# Patient Record
Sex: Female | Born: 1945
Health system: Southern US, Community
[De-identification: ages and names within clinical notes are randomized; demographics above are authoritative.]

## PROBLEM LIST (undated history)

## (undated) DIAGNOSIS — I10 Essential (primary) hypertension: Secondary | ICD-10-CM

## (undated) DIAGNOSIS — Z90710 Acquired absence of both cervix and uterus: Secondary | ICD-10-CM

## (undated) DIAGNOSIS — Z5989 Other problems related to housing and economic circumstances: Secondary | ICD-10-CM

## (undated) DIAGNOSIS — F0390 Unspecified dementia without behavioral disturbance: Secondary | ICD-10-CM

## (undated) DIAGNOSIS — S6290XA Unspecified fracture of unspecified wrist and hand, initial encounter for closed fracture: Secondary | ICD-10-CM

## (undated) DIAGNOSIS — Z5987 Material hardship due to limited financial resources, not elsewhere classified: Secondary | ICD-10-CM

## (undated) DIAGNOSIS — I639 Cerebral infarction, unspecified: Secondary | ICD-10-CM

## (undated) DIAGNOSIS — Z720 Tobacco use: Secondary | ICD-10-CM

## (undated) DIAGNOSIS — IMO0002 Reserved for concepts with insufficient information to code with codable children: Secondary | ICD-10-CM

## (undated) DIAGNOSIS — R51 Headache: Secondary | ICD-10-CM

## (undated) DIAGNOSIS — F419 Anxiety disorder, unspecified: Secondary | ICD-10-CM

## (undated) DIAGNOSIS — Z90722 Acquired absence of ovaries, bilateral: Secondary | ICD-10-CM

## (undated) DIAGNOSIS — Z598 Other problems related to housing and economic circumstances: Secondary | ICD-10-CM

## (undated) DIAGNOSIS — M1990 Unspecified osteoarthritis, unspecified site: Secondary | ICD-10-CM

## (undated) DIAGNOSIS — A5901 Trichomonal vulvovaginitis: Secondary | ICD-10-CM

## (undated) DIAGNOSIS — Z9079 Acquired absence of other genital organ(s): Secondary | ICD-10-CM

## (undated) DIAGNOSIS — F1011 Alcohol abuse, in remission: Secondary | ICD-10-CM

## (undated) DIAGNOSIS — F1410 Cocaine abuse, uncomplicated: Secondary | ICD-10-CM

## (undated) DIAGNOSIS — R519 Headache, unspecified: Secondary | ICD-10-CM

## (undated) HISTORY — DX: Cocaine abuse, uncomplicated: F14.10

## (undated) HISTORY — DX: Tobacco use: Z72.0

## (undated) HISTORY — PX: FOOT SURGERY: SHX648

## (undated) HISTORY — DX: Unspecified fracture of unspecified wrist and hand, initial encounter for closed fracture: S62.90XA

## (undated) HISTORY — DX: Unspecified osteoarthritis, unspecified site: M19.90

## (undated) HISTORY — DX: Headache: R51

## (undated) HISTORY — DX: Other problems related to housing and economic circumstances: Z59.89

## (undated) HISTORY — DX: Acquired absence of ovaries, bilateral: Z90.722

## (undated) HISTORY — DX: Trichomonal vulvovaginitis: A59.01

## (undated) HISTORY — DX: Anxiety disorder, unspecified: F41.9

## (undated) HISTORY — DX: Other problems related to housing and economic circumstances: Z59.8

## (undated) HISTORY — DX: Headache, unspecified: R51.9

## (undated) HISTORY — DX: Acquired absence of both cervix and uterus: Z90.79

## (undated) HISTORY — DX: Unspecified dementia, unspecified severity, without behavioral disturbance, psychotic disturbance, mood disturbance, and anxiety: F03.90

## (undated) HISTORY — DX: Reserved for concepts with insufficient information to code with codable children: IMO0002

## (undated) HISTORY — DX: Alcohol abuse, in remission: F10.11

## (undated) HISTORY — DX: Acquired absence of other genital organ(s): Z90.710

## (undated) HISTORY — DX: Cerebral infarction, unspecified: I63.9

## (undated) HISTORY — DX: Material hardship due to limited financial resources, not elsewhere classified: Z59.87

## (undated) HISTORY — DX: Essential (primary) hypertension: I10

---

## 1989-04-13 HISTORY — PX: ABDOMINAL HYSTERECTOMY: SHX81

## 1998-03-09 ENCOUNTER — Emergency Department (HOSPITAL_COMMUNITY): Admission: EM | Admit: 1998-03-09 | Discharge: 1998-03-09 | Payer: Self-pay | Admitting: *Deleted

## 1998-03-10 ENCOUNTER — Emergency Department (HOSPITAL_COMMUNITY): Admission: EM | Admit: 1998-03-10 | Discharge: 1998-03-10 | Payer: Self-pay | Admitting: Emergency Medicine

## 1998-03-10 ENCOUNTER — Encounter: Payer: Self-pay | Admitting: Emergency Medicine

## 1999-05-30 ENCOUNTER — Encounter: Payer: Self-pay | Admitting: Emergency Medicine

## 1999-05-30 ENCOUNTER — Emergency Department (HOSPITAL_COMMUNITY): Admission: EM | Admit: 1999-05-30 | Discharge: 1999-05-30 | Payer: Self-pay | Admitting: Emergency Medicine

## 1999-06-04 ENCOUNTER — Encounter: Admission: RE | Admit: 1999-06-04 | Discharge: 1999-06-04 | Payer: Self-pay | Admitting: Internal Medicine

## 1999-08-12 ENCOUNTER — Ambulatory Visit (HOSPITAL_COMMUNITY): Admission: RE | Admit: 1999-08-12 | Discharge: 1999-08-12 | Payer: Self-pay | Admitting: Internal Medicine

## 1999-08-12 ENCOUNTER — Encounter: Admission: RE | Admit: 1999-08-12 | Discharge: 1999-08-12 | Payer: Self-pay | Admitting: Internal Medicine

## 1999-09-09 ENCOUNTER — Encounter: Admission: RE | Admit: 1999-09-09 | Discharge: 1999-09-09 | Payer: Self-pay | Admitting: Internal Medicine

## 2003-02-15 ENCOUNTER — Emergency Department (HOSPITAL_COMMUNITY): Admission: EM | Admit: 2003-02-15 | Discharge: 2003-02-15 | Payer: Self-pay | Admitting: Emergency Medicine

## 2003-07-10 ENCOUNTER — Emergency Department (HOSPITAL_COMMUNITY): Admission: EM | Admit: 2003-07-10 | Discharge: 2003-07-10 | Payer: Self-pay | Admitting: *Deleted

## 2003-08-20 ENCOUNTER — Emergency Department (HOSPITAL_COMMUNITY): Admission: EM | Admit: 2003-08-20 | Discharge: 2003-08-20 | Payer: Self-pay | Admitting: Emergency Medicine

## 2004-05-18 ENCOUNTER — Emergency Department (HOSPITAL_COMMUNITY): Admission: EM | Admit: 2004-05-18 | Discharge: 2004-05-18 | Payer: Self-pay | Admitting: Emergency Medicine

## 2004-06-05 ENCOUNTER — Ambulatory Visit: Payer: Self-pay | Admitting: Internal Medicine

## 2004-07-30 ENCOUNTER — Emergency Department (HOSPITAL_COMMUNITY): Admission: EM | Admit: 2004-07-30 | Discharge: 2004-07-30 | Payer: Self-pay | Admitting: Emergency Medicine

## 2004-09-16 ENCOUNTER — Ambulatory Visit: Payer: Self-pay | Admitting: Internal Medicine

## 2005-08-12 ENCOUNTER — Emergency Department (HOSPITAL_COMMUNITY): Admission: EM | Admit: 2005-08-12 | Discharge: 2005-08-12 | Payer: Self-pay | Admitting: Family Medicine

## 2005-08-28 ENCOUNTER — Emergency Department (HOSPITAL_COMMUNITY): Admission: EM | Admit: 2005-08-28 | Discharge: 2005-08-28 | Payer: Self-pay | Admitting: Emergency Medicine

## 2005-09-04 ENCOUNTER — Ambulatory Visit: Payer: Self-pay | Admitting: Hospitalist

## 2005-10-22 ENCOUNTER — Encounter: Payer: Self-pay | Admitting: *Deleted

## 2006-02-21 ENCOUNTER — Observation Stay (HOSPITAL_COMMUNITY): Admission: EM | Admit: 2006-02-21 | Discharge: 2006-02-22 | Payer: Self-pay | Admitting: Emergency Medicine

## 2006-02-21 ENCOUNTER — Ambulatory Visit: Payer: Self-pay | Admitting: Infectious Diseases

## 2006-06-08 DIAGNOSIS — J45909 Unspecified asthma, uncomplicated: Secondary | ICD-10-CM | POA: Insufficient documentation

## 2006-06-08 DIAGNOSIS — A5901 Trichomonal vulvovaginitis: Secondary | ICD-10-CM

## 2006-06-08 DIAGNOSIS — IMO0002 Reserved for concepts with insufficient information to code with codable children: Secondary | ICD-10-CM

## 2006-06-08 DIAGNOSIS — I10 Essential (primary) hypertension: Secondary | ICD-10-CM

## 2006-06-08 DIAGNOSIS — F1410 Cocaine abuse, uncomplicated: Secondary | ICD-10-CM | POA: Insufficient documentation

## 2006-06-08 DIAGNOSIS — R319 Hematuria, unspecified: Secondary | ICD-10-CM | POA: Insufficient documentation

## 2006-06-08 DIAGNOSIS — Z9079 Acquired absence of other genital organ(s): Secondary | ICD-10-CM | POA: Insufficient documentation

## 2006-07-03 DIAGNOSIS — I6350 Cerebral infarction due to unspecified occlusion or stenosis of unspecified cerebral artery: Secondary | ICD-10-CM | POA: Insufficient documentation

## 2006-07-03 DIAGNOSIS — R519 Headache, unspecified: Secondary | ICD-10-CM | POA: Insufficient documentation

## 2006-07-03 DIAGNOSIS — M1990 Unspecified osteoarthritis, unspecified site: Secondary | ICD-10-CM

## 2006-07-03 DIAGNOSIS — R51 Headache: Secondary | ICD-10-CM

## 2006-07-03 DIAGNOSIS — F411 Generalized anxiety disorder: Secondary | ICD-10-CM | POA: Insufficient documentation

## 2006-07-03 DIAGNOSIS — S62309A Unspecified fracture of unspecified metacarpal bone, initial encounter for closed fracture: Secondary | ICD-10-CM | POA: Insufficient documentation

## 2006-07-03 DIAGNOSIS — F1011 Alcohol abuse, in remission: Secondary | ICD-10-CM | POA: Insufficient documentation

## 2006-07-03 DIAGNOSIS — F17200 Nicotine dependence, unspecified, uncomplicated: Secondary | ICD-10-CM

## 2006-08-26 ENCOUNTER — Telehealth (INDEPENDENT_AMBULATORY_CARE_PROVIDER_SITE_OTHER): Payer: Self-pay | Admitting: *Deleted

## 2006-11-26 ENCOUNTER — Emergency Department (HOSPITAL_COMMUNITY): Admission: EM | Admit: 2006-11-26 | Discharge: 2006-11-26 | Payer: Self-pay | Admitting: Emergency Medicine

## 2006-11-30 DIAGNOSIS — S2239XA Fracture of one rib, unspecified side, initial encounter for closed fracture: Secondary | ICD-10-CM | POA: Insufficient documentation

## 2006-12-04 ENCOUNTER — Ambulatory Visit: Payer: Self-pay | Admitting: Internal Medicine

## 2007-02-23 ENCOUNTER — Encounter (INDEPENDENT_AMBULATORY_CARE_PROVIDER_SITE_OTHER): Payer: Self-pay | Admitting: Hospitalist

## 2007-03-24 ENCOUNTER — Encounter (INDEPENDENT_AMBULATORY_CARE_PROVIDER_SITE_OTHER): Payer: Self-pay | Admitting: Hospitalist

## 2007-05-28 ENCOUNTER — Emergency Department (HOSPITAL_COMMUNITY): Admission: EM | Admit: 2007-05-28 | Discharge: 2007-05-28 | Payer: Self-pay | Admitting: Emergency Medicine

## 2007-05-28 ENCOUNTER — Telehealth (INDEPENDENT_AMBULATORY_CARE_PROVIDER_SITE_OTHER): Payer: Self-pay | Admitting: *Deleted

## 2007-06-03 ENCOUNTER — Ambulatory Visit: Payer: Self-pay | Admitting: Infectious Diseases

## 2007-06-03 ENCOUNTER — Encounter (INDEPENDENT_AMBULATORY_CARE_PROVIDER_SITE_OTHER): Payer: Self-pay | Admitting: *Deleted

## 2007-06-03 LAB — CONVERTED CEMR LAB
Calcium: 9.9 mg/dL (ref 8.4–10.5)
Creatinine, Ser: 1.03 mg/dL (ref 0.40–1.20)
Glucose, Bld: 72 mg/dL (ref 70–99)
Potassium: 4.1 meq/L (ref 3.5–5.3)
Sodium: 144 meq/L (ref 135–145)

## 2007-11-04 ENCOUNTER — Ambulatory Visit: Payer: Self-pay | Admitting: Internal Medicine

## 2007-11-04 DIAGNOSIS — R079 Chest pain, unspecified: Secondary | ICD-10-CM

## 2007-11-18 ENCOUNTER — Ambulatory Visit: Payer: Self-pay | Admitting: *Deleted

## 2007-11-18 DIAGNOSIS — M21619 Bunion of unspecified foot: Secondary | ICD-10-CM

## 2007-11-18 DIAGNOSIS — J069 Acute upper respiratory infection, unspecified: Secondary | ICD-10-CM | POA: Insufficient documentation

## 2008-01-28 ENCOUNTER — Encounter: Payer: Self-pay | Admitting: Internal Medicine

## 2008-09-07 ENCOUNTER — Telehealth: Payer: Self-pay | Admitting: Internal Medicine

## 2009-03-21 ENCOUNTER — Ambulatory Visit: Payer: Self-pay | Admitting: Infectious Diseases

## 2009-03-21 DIAGNOSIS — L0293 Carbuncle, unspecified: Secondary | ICD-10-CM

## 2009-03-21 DIAGNOSIS — L0292 Furuncle, unspecified: Secondary | ICD-10-CM | POA: Insufficient documentation

## 2009-03-21 LAB — CONVERTED CEMR LAB
ALT: 12 units/L (ref 0–35)
Cholesterol: 149 mg/dL (ref 0–200)
Glucose, Bld: 96 mg/dL (ref 70–99)
HDL: 64 mg/dL (ref >39)
Potassium: 4 meq/L (ref 3.5–5.3)
Sodium: 143 meq/L (ref 135–145)
TSH: 1.088 microintl units/mL (ref 0.350–4.5)
Total Bilirubin: 0.3 mg/dL (ref 0.3–1.2)
Total CHOL/HDL Ratio: 2.3
Triglycerides: 143 mg/dL (ref <150)

## 2009-04-09 ENCOUNTER — Ambulatory Visit: Payer: Self-pay | Admitting: Internal Medicine

## 2009-04-09 ENCOUNTER — Ambulatory Visit (HOSPITAL_COMMUNITY): Admission: RE | Admit: 2009-04-09 | Discharge: 2009-04-09 | Payer: Self-pay | Admitting: Internal Medicine

## 2009-08-10 ENCOUNTER — Ambulatory Visit: Payer: Self-pay | Admitting: Internal Medicine

## 2009-08-10 DIAGNOSIS — F329 Major depressive disorder, single episode, unspecified: Secondary | ICD-10-CM

## 2009-08-10 LAB — CONVERTED CEMR LAB
ALT: 15 units/L (ref 0–35)
AST: 20 units/L (ref 0–37)
Alkaline Phosphatase: 62 units/L (ref 39–117)
Calcium: 9.3 mg/dL (ref 8.4–10.5)
Creatinine, Ser: 0.88 mg/dL (ref 0.40–1.20)
Glucose, Bld: 87 mg/dL (ref 70–99)
Sodium: 143 meq/L (ref 135–145)
TSH: 1.042 microintl units/mL (ref 0.350–4.5)

## 2009-08-27 ENCOUNTER — Encounter: Payer: Self-pay | Admitting: Internal Medicine

## 2010-05-08 ENCOUNTER — Telehealth: Payer: Self-pay | Admitting: Internal Medicine

## 2010-05-11 IMAGING — CR DG FOOT COMPLETE 3+V*L*
3 series · 3 of 3 positions shown · non-contrast
Comparison: No prior studies.

CLINICAL DATA: Chronic pain associated with the first through third
toes bilaterally.  History of previous surgery left fifth toe.

LEFT FOOT - COMPLETE 3+ VIEW

[t foot ap left]
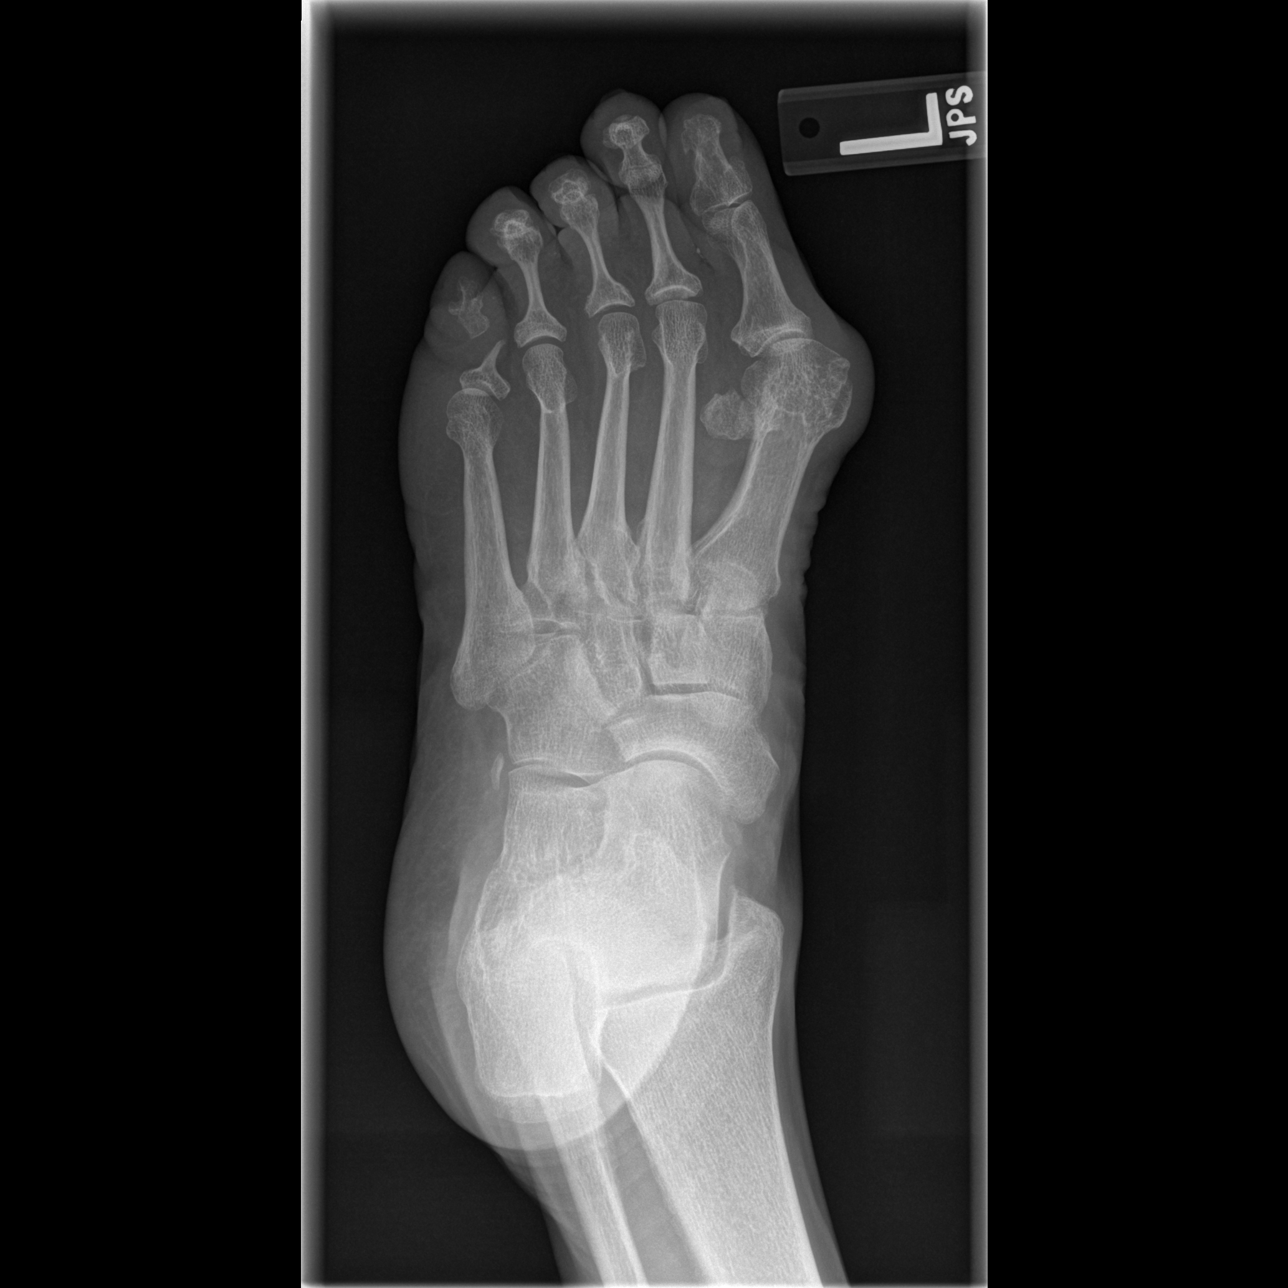

[t foot oblique left]
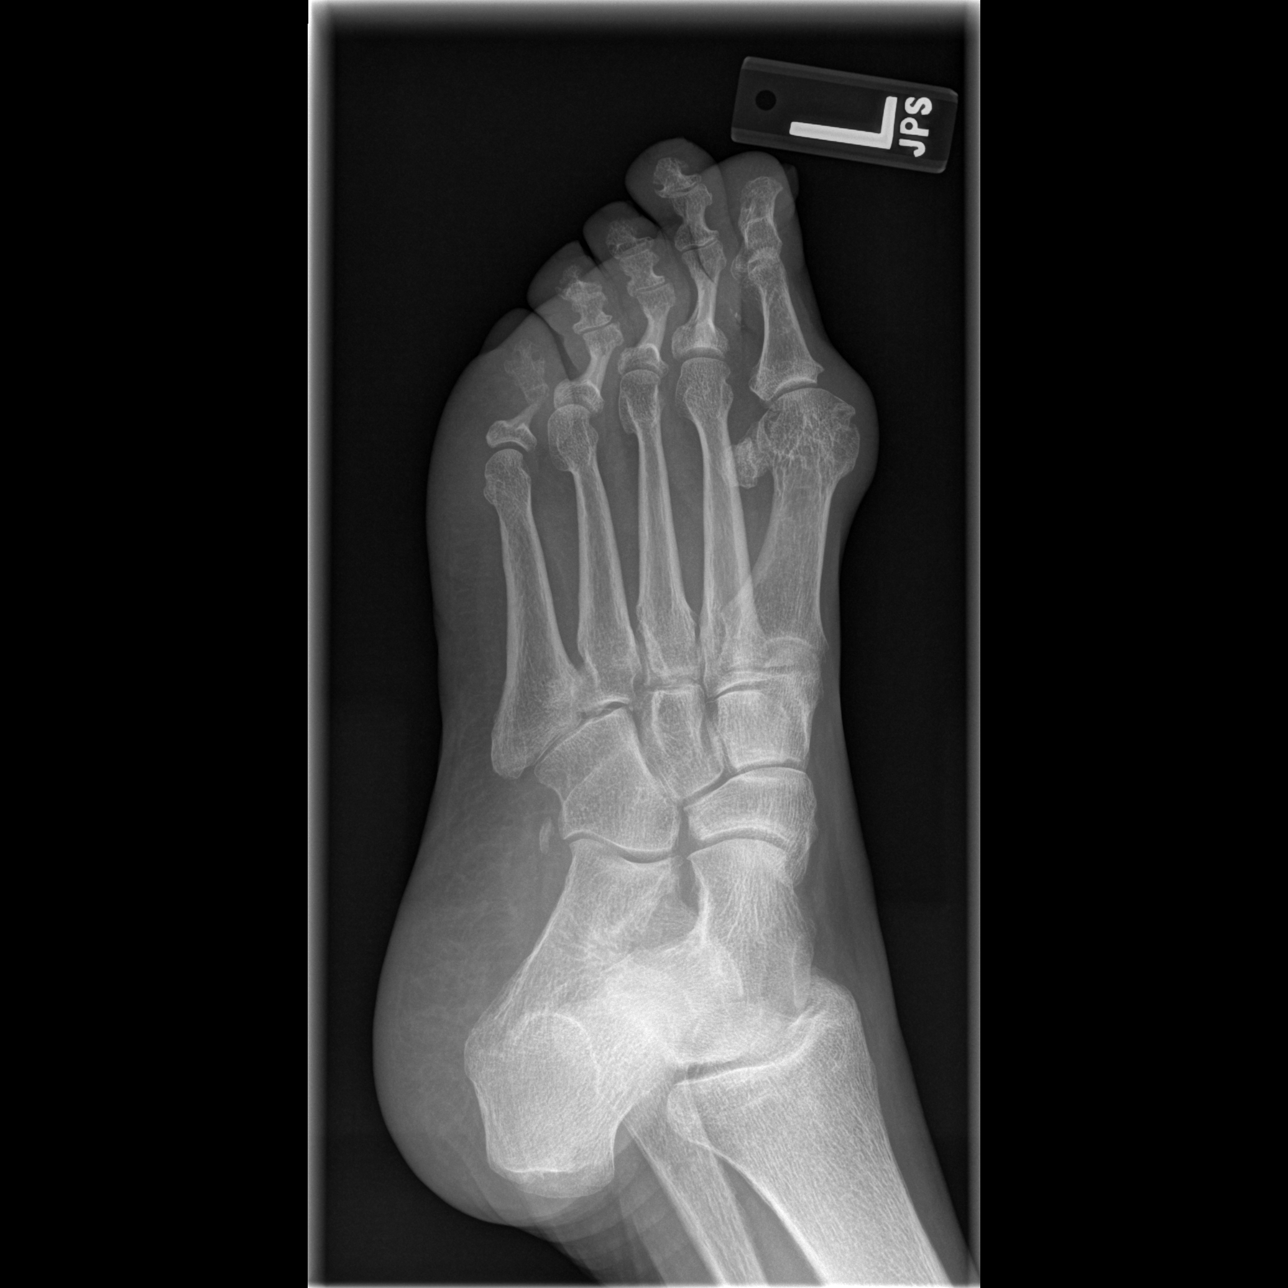

[t foot lat left]
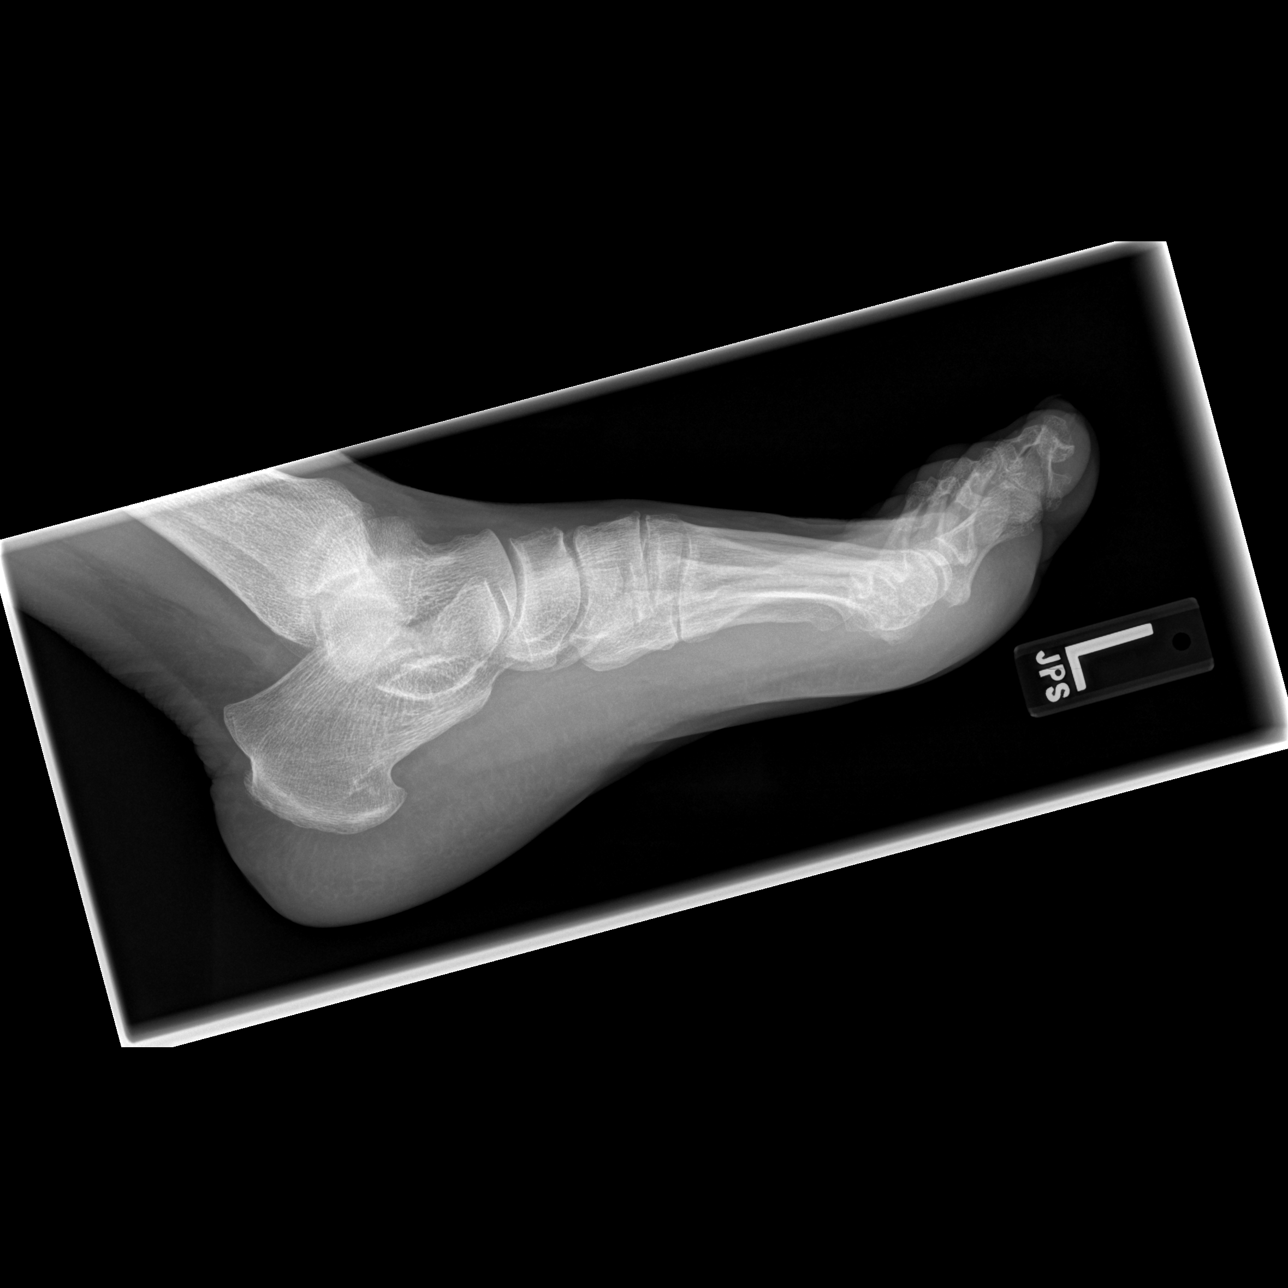

[3 of 3 positions shown; findings below may reference images not displayed]

FINDINGS: There are changes of a bunion associated the left first
metatarsal phalangeal joint with overgrowth of the first metatarsal
head and hallux valgus deformity present.  The distal portion of
the proximal phalanx of the left fifth toe has been previously
resected.  There is no evidence for occult fracture.  There are no
dislocations.
IMPRESSION: 1.  Changes of a bunion associated with the left first metatarsal
phalangeal joint.
2.  Previous surgery associated with the left 5th toe.
3.  No acute findings.

## 2010-07-02 ENCOUNTER — Ambulatory Visit: Admit: 2010-07-02 | Payer: Self-pay

## 2010-07-23 NOTE — Letter (Signed)
Summary: [place] [facility] Medical Assistance: [facility]  [place] [facility] Medical Assistance: P[employer]   Imported By: Florinda Marker 08/28/2009 14:59:04  _____________________________________________________________________  External Attachment:    Type:   Image     Comment:   External Document

## 2010-07-23 NOTE — Progress Notes (Signed)
Summary: med refill/gp  Phone Note Refill Request Message from:  Fax from Pharmacy on May 08, 2010 10:17 AM  Refills Requested: Medication #1:  ZESTORETIC 20-25 MG  TABS Take 1 tablet by mouth once a day   Last Refilled: 12/01/2009 Last appt. Feb 2011.   Method Requested: Electronic Initial call taken by: Chinita Pester RN,  May 08, 2010 10:17 AM  Follow-up for Phone Call        Refill approved for 2 months only; patient needs followup during that time and also bloodwork; before further refills are provided.  Thanks!!!    Prescriptions: ZESTORETIC 20-25 MG  TABS (LISINOPRIL-HYDROCHLOROTHIAZIDE) Take 1 tablet by mouth once a day  #30 x 2   Entered and Authorized by:   Vassie Loll MD   Signed by:   Vassie Loll MD on 05/09/2010   Method used:   [facility] [address] [phone]       [address]       [place], [place]  [address]       Ph: [phone] or [phone]       Fax: [phone]   RxID:   [id number]

## 2010-07-23 NOTE — Assessment & Plan Note (Signed)
Summary: FU VISIT RS FROM 08/07/09/DS   Vital Signs:  Patient profile:   65 year old female Height:      65.4 inches (166.12 cm) Weight:      139.7 pounds (63.50 kg) BMI:     23.05 Temp:     97.7 degrees F (36.50 degrees C) oral Pulse rate:   72 / minute BP sitting:   172 / 99  (right arm)  Vitals Entered By: Dorie Rank RN (August 10, 2009 9:55 AM) Is Patient Diabetic? No Pain Assessment Patient in pain? no      Nutritional Status BMI of 19 -24 = normal Nutritional Status Detail appetite good  Have you ever been in a relationship where you felt threatened, hurt or afraid?denies   Does patient need assistance? Functional Status Self care Ambulation Normal Comments FU and ck feet - toes numb - long time. Wants CNA.   History of Present Illness: 65 y/o female with pmh as outlined in the EMR; who comes to the clinic for followup of her chronic problems and also to get refill on her medications.  Patient is complaining of chronic numbness and pain on her toes (especially left foot toes); looking at her records she was refer to a podiatrist and she never follow with the appoinment; x-ray of her feet were done during previous visit demonstrating changes in size of her bunions, but w/o any other deformities or fractures.   Patient missed her appoinment at mental health as well and is now in need of her psychiatrist medication too.  Depression History:      The patient denies a depressed mood most of the day and a diminished interest in her usual daily activities.        The patient denies that she feels like life is not worth living, denies that she wishes that she were dead, and denies that she has thought about ending her life.         Preventive Screening-Counseling & Management  Alcohol-Tobacco     Smoking Status: current     Smoking Cessation Counseling: yes     Packs/Day: 2cigs per day     Year Started: 1pk every 2 weeks  Caffeine-Diet-Exercise     Does Patient  Exercise: yes     Type of exercise: walking  Problems Prior to Update: 1)  Depressive Disorder Not Elsewhere Classified  (ICD-311) 2)  Foot Deformity, B/l  (ICD-736.70) 3)  Furuncle  (ICD-680.9) 4)  Bunion  (ICD-727.1) 5)  Uri  (ICD-465.9) 6)  Chest Pain Unspecified  (ICD-786.50) 7)  Fracture, Rib  (ICD-807.00) 8)  Hypertension  (ICD-401.9) 9)  Hx of Hematuria, Chronic  (ICD-599.7) 10)  Hx of Trichomonal Vaginitis  (ICD-131.01) 11)  Asthma  (ICD-493.90) 12)  Anxiety State Nos  (ICD-300.00) 13)  Headache, Chronic  (ICD-784.0) 14)  Cva  (ICD-434.91) 15)  Tah/bso, Hx of  (ICD-V45.77) 16)  Tobacco Abuse  (ICD-305.1) 17)  Abuse, Cocaine, Unspecified  (ICD-305.60) 18)  Abuse, Alcohol, in Remission  (ICD-305.03) 19)  Degenerative Joint Disease, Back  (ICD-715.98) 20)  Fracture, Hand  (ICD-815.00) 21)  Inadequate Material Resources  (ICD-V60.2) 22)  Problem, Mental/behavioral Nos  (ICD-V40.9)  Current Problems (verified): 1)  Foot Deformity, B/l  (ICD-736.70) 2)  Furuncle  (ICD-680.9) 3)  Bunion  (ICD-727.1) 4)  Uri  (ICD-465.9) 5)  Chest Pain Unspecified  (ICD-786.50) 6)  Fracture, Rib  (ICD-807.00) 7)  Hypertension  (ICD-401.9) 8)  Hx of Hematuria, Chronic  (ICD-599.7) 9)  Hx  of Trichomonal Vaginitis  (ICD-131.01) 10)  Asthma  (ICD-493.90) 11)  Anxiety State Nos  (ICD-300.00) 12)  Headache, Chronic  (ICD-784.0) 13)  Cva  (ICD-434.91) 14)  Tah/bso, Hx of  (ICD-V45.77) 15)  Tobacco Abuse  (ICD-305.1) 16)  Abuse, Cocaine, Unspecified  (ICD-305.60) 17)  Abuse, Alcohol, in Remission  (ICD-305.03) 18)  Degenerative Joint Disease, Back  (ICD-715.98) 19)  Fracture, Hand  (ICD-815.00) 20)  Inadequate Material Resources  (ICD-V60.2) 21)  Problem, Mental/behavioral Nos  (ICD-V40.9)  Medications Prior to Update: 1)  Zestoretic 20-25 Mg  Tabs (Lisinopril-Hydrochlorothiazide) .... Take 1 Tablet By Mouth Once A Day 2)  Aspirin 81 Mg Tabs (Aspirin) .... Once Daily 3)  Doxycycline  Hyclate 100 Mg Caps (Doxycycline Hyclate) .... Take 1 Capsule By Mouth Daily.  Current Medications (verified): 1)  Zestoretic 20-25 Mg  Tabs (Lisinopril-Hydrochlorothiazide) .... Take 1 Tablet By Mouth Once A Day 2)  Aspirin 81 Mg Tabs (Aspirin) .... Once Daily  Allergies (verified): No Known Drug Allergies  Past History:  Past Medical History: Last updated: 06/08/2006 Current Problems:  HYPERTENSION (ICD-401.9) Hx of HEMATURIA, CHRONIC (ICD-599.7) Hx of TRICHOMONAL VAGINITIS (ICD-131.01) ASTHMA (ICD-493.90) ANXIETY STATE NOS (ICD-300.00) HEADACHE, CHRONIC (ICD-784.0) CVA (ICD-434.91) TAH/BSO, HX OF (ICD-V45.77) TOBACCO ABUSE (ICD-305.1) ABUSE, COCAINE, UNSPECIFIED (ICD-305.60) ABUSE, ALCOHOL, IN REMISSION (ICD-305.03) DEGENERATIVE JOINT DISEASE, BACK (ICD-715.98) FRACTURE, HAND (ICD-815.00) INADEQUATE MATERIAL RESOURCES (ICD-V60.2) PROBLEM, MENTAL/BEHAVIORAL NOS (ICD-V40.9)  Risk Factors: Exercise: yes (08/10/2009)  Risk Factors: Smoking Status: current (08/10/2009) Packs/Day: 2cigs per day (08/10/2009)  Social History: Packs/Day:  2cigs per day  Review of Systems       As per HPI.  Physical Exam  General:  alert, well-developed, and well-hydrated.   Lungs:  normal breath sounds, no crackles, and no wheezes.   Heart:  normal rate, regular rhythm, no murmur, and no gallop.   Abdomen:  soft and non-tender.   Msk:  Deformity noted on both foot at medial aspect. Pedal pulses strong. No redness, swelling or warmth appreciated. Patient complaining of numbness.  Extremities:  trace left pedal edema and trace right pedal edema.   Neurologic:  alert & oriented X3.     Impression & Recommendations:  Problem # 1:  DEPRESSIVE DISORDER NOT ELSEWHERE CLASSIFIED (ICD-311) [facility] and also for psychotherapy. Will refill her fluoxetine 20mg  daily today and will encourage her to follow with her psychiatrist. No SI, no  hallucination.  Her updated medication list for this problem includes:    Fluoxetine Hcl 20 Mg Caps (Fluoxetine hcl) .Marland Kitchen... Take 1 capsule by mouth once a day  Problem # 2:  FOOT DEFORMITY, B/L (ICD-736.70) Patient with bilateral bunions which are growing and most likely compressing her peroneal nerve (which would explain her feet numbness); patient was referred in the past to a podiatrist, but she missed the appointment. Will make another referral, will start neurontin and will also request for home health assistance, since she is unable to performed her ADL due to feet problems and also residual changes after CVA.   Orders: Home Health Referral [facility])  Problem # 3:  HYPERTENSION (ICD-401.9) Patient BP elevated, but she has been w/o medications for the last copule of weeks. Patient is asymptomatic; importance for medication compliance, risk and major side effects were discussed during this visit with her. Will advised to follow a low sodium diet and also to take her medications on daily basis (medication refill was given). Will check electrolytes and renal function today.  Her  updated medication list for this problem includes:    Zestoretic 20-25 Mg Tabs (Lisinopril-hydrochlorothiazide) .Marland Kitchen... Take 1 tablet by mouth once a day  Orders: T-Comprehensive Metabolic Panel ([id number]) T-TSH ([id number])  Problem # 4:  ANXIETY STATE NOS (ICD-300.00) Patient with hx of anxiety. She is requesting xanax, but there is no records of that medicine in her medication list. Will advised to follow with psychiatrist for further evaluation and medication adjustment. Will refill her fluoxetine today; she is not anxious and there is not SI or hallucination.  Her updated medication list for this problem includes:    Fluoxetine Hcl 20 Mg Caps (Fluoxetine hcl) .Marland Kitchen... Take 1 capsule by mouth once a day  Complete Medication List: 1)  Zestoretic 20-25 Mg Tabs (Lisinopril-hydrochlorothiazide) .... Take 1  tablet by mouth once a day 2)  Aspirin 81 Mg Tabs (Aspirin) .... Once daily 3)  Fluoxetine Hcl 20 Mg Caps (Fluoxetine hcl) .... Take 1 capsule by mouth once a day 4)  Neurontin 100 Mg Caps (Gabapentin) .... Take 2 tablets by mouth three times a day.  Patient Instructions: 1)  Please schedule a follow-up appointment in 3 weeks. 2)  Take your medications as prescribed. 3)  Follow with your mental health and podiatry appointment. 4)  Follow a low sodium diet (less than 2000 mg daily). 5)  You will be called with any abnormalities in the tests scheduled or performed today.  If you don't hear from [place] within a week from when the test was performed, you can assume that your test was normal.  Prescriptions: NEURONTIN 100 MG CAPS (GABAPENTIN) Take 2 tablets by mouth three times a day.  #90 x 2   Entered and Authorized by:   Vassie Loll MD   Signed by:   Vassie Loll MD on 08/10/2009   Method used:   [facility]        [facility]  [address] [phone][facility] (retail)       [address]       [place], [place]  [address]       Ph: [phone] or [phone]       Fax: [phone]   RxID:   [id number] FLUOXETINE HCL 20 MG CAPS (FLUOXETINE HCL) Take 1 capsule by mouth once a day  #31 x 1   Entered and Authorized by:   Vassie Loll MD   Signed by:   Vassie Loll MD on 08/10/2009   Method used:   [facility] to        [facility]  [address] [phone]* (retail)       [address]       [place], [place]  [address]       Ph: [phone] or [phone]       Fax: [phone]   RxID:   [id number]  Process Orders Check Orders Results:     [facility]: Check successful Tests Sent for requisitioning (August 21, 2009 8:21 PM):     08/10/2009: [facility] -- T-Comprehensive Metabolic Panel [[id number]] (signed)     08/10/2009: [facility] -- T-TSH [phone] (signed)    Prevention & Chronic Care Immunizations   Influenza  vaccine: Not documented   Influenza vaccine deferral: Refused  (03/21/2009)    Tetanus booster: Not documented    Pneumococcal vaccine: Not documented    H. zoster vaccine: Not documented  Colorectal Screening   Hemoccult: Not documented    Colonoscopy: Not documented  Other Screening   Pap smear: Not documented  Mammogram: Not documented    DXA bone density scan: Not documented   Smoking status: current  (08/10/2009)   Smoking cessation counseling: yes  (08/10/2009)  Lipids   Total Cholesterol: 149  (03/21/2009)   Lipid panel action/deferral: Lipid Panel ordered   LDL: 56  (03/21/2009)   LDL Direct: Not documented   HDL: 64  (03/21/2009)   Triglycerides: 143  (03/21/2009)  Hypertension   Last Blood Pressure: 172 / 99  (08/10/2009)   Serum creatinine: 0.84  (03/21/2009)   Serum potassium 4.0  (03/21/2009) CMP ordered   Self-Management Support :   Personal Goals (by the next clinic visit) :      Personal blood pressure goal: 140/90  (03/21/2009)   Patient will work on the following items until the next clinic visit to reach self-care goals:     Medications and monitoring: take my medicines every day, bring all of my medications to every visit  (08/10/2009)     Eating: drink diet soda or water instead of juice or soda, eat more vegetables, use fresh or frozen vegetables, eat baked foods instead of fried foods, eat fruit for snacks and desserts, limit or avoid alcohol  (08/10/2009)     Activity: take a 30 minute walk every day, take the stairs instead of the elevator, park at the far end of the parking lot  (08/10/2009)     Other: walks all the time  (04/09/2009)    Hypertension self-management support: Written self-care plan, Education handout  (03/21/2009)

## 2010-09-04 ENCOUNTER — Encounter: Payer: Self-pay | Admitting: Internal Medicine

## 2010-10-02 ENCOUNTER — Other Ambulatory Visit: Payer: Self-pay | Admitting: *Deleted

## 2010-10-02 MED ORDER — LISINOPRIL-HYDROCHLOROTHIAZIDE 20-25 MG PO TABS: 1.0000 | ORAL_TABLET | Freq: Every day | ORAL | Status: DC

## 2010-10-16 ENCOUNTER — Encounter: Payer: Self-pay | Admitting: Internal Medicine

## 2010-11-08 ENCOUNTER — Encounter: Payer: Self-pay | Admitting: Internal Medicine

## 2010-11-08 NOTE — Discharge Summary (Signed)
NAMECAITLAN, Deborah [name]                ACCOUNT NO.:  [id number]   MEDICAL RECORD NO.:  [id number]          PATIENT TYPE:  INP   LOCATION:  6709                         FACILITY:  [facility]   PHYSICIAN:  Duncan Dull, M.D.     DATE OF BIRTH:  [birth year]   DATE OF ADMISSION:  02/21/2006  DATE OF DISCHARGE:  02/22/2006                                 DISCHARGE SUMMARY   DISCHARGE DIAGNOSES:  1. Iatrogenic hypotension, secondary to clonidine usage.  2. Hypokalemia.  3. Abdominal pain/nausea.  4. History of hypertension.  5. Arthritis.  6. Depression.  7. Anemia, microcytic.   DISCHARGE MEDICATIONS:  1. Tylenol 650 mg one to two tablets p.o. q.6 hours p.r.n. arthritis pain.  2. Lisinopril 10 mg p.o. daily.  3. Amitriptyline 50 mg p.o. q.h.s.  4. Hydrochlorothiazide 25 mg p.o. daily.  5. Aspirin 81 mg p.o. daily.   DISPOSITION AND FOLLOWUP:  The patient is to followup with Dr. Okey Dupre in  the Outpatient Clinic on March 12, 2006 at 9:45 a.m. for a blood  pressure check and to check a BMET secondary to restarting  hydrochlorothiazide and lisinopril.   PROCEDURES PERFORMED:  No procedures performed during this admission.   CONSULTATIONS:  No consultations ordered.   BRIEF HISTORY AND PHYSICAL/HPI:  The patient is a 65 year old African  American woman with a past medical history significant for hypertension,  arthritis, asthma and depression, presenting with dizziness, nausea and  weakness.  The patient has currently been off her blood pressure medicines  for 1 to 2 months now.  She does not know which medication she was taking  for her hypertension, but lisinopril and hydrochlorothiazide were on her  medication list on March of 2007.  These medications were ordered by Dr.  Okey Dupre.  The patient apparently took a friend's clonidine 0.3 mg pill at  approximately 9:30 a.m. on the morning of admission.  Approximately 30  minutes to an hour and half later, patient became dizzy, nauseous  and weak.  She also reports one episode of nonbloody diarrhea and abdominal pain, which  she describes as 4/10, epigastric, intermittent, and starting approximately  2 hours prior to coming to the hospital.  Abdominal pain has since resolved.  She has reported no further episodes of diarrhea.  The patient denies chest  pain, loss of consciousness, palpitations, blurry vision and lower extremity  tenderness.  The patient has no other complaints.  The patient does not have  any allergies that she knows of.   PAST MEDICAL HISTORY:  Significant for:  1. Hypertension.  2. Arthritis.  3. Asthma.  4. Depression.  5. Hematuria.   PAST SURGICAL HISTORY:  Significant for a hysterectomy in 1991.   HOME MEDICATIONS:  Patient's home medications as per the medication list  from the clinic includes:  1. Tylenol 650 mg one to two tablets p.o. p.r.n. arthritis.  2. Lisinopril 20 mg p.o. daily.  3. Amitriptyline 50 mg p.o. q.h.s.  4. Hydrochlorothiazide 25 mg p.o. daily.  5. Aspirin 81 mg p.o. daily.   SOCIAL HISTORY:  The patient currently smokes 2 to  3 cigarettes per day x10  years.  She reports drinking alcohol socially.  She denies any IV drug use  or illicit drug use.  She is currently a widow and her education level is  the 11th grade.  She is a Medicaid patient.   FAMILY HISTORY:  Mother died of an aneurysm at 53 years of age.  Father died  from a neck cancer of unknown origin at the age of 61.  She has a sister and  brother with diabetes.  Patient has no children.   REVIEW OF SYSTEMS:  Is positive for weight loss, fatigue and headache.  The  rest were mentioned in the history of present illness.   PHYSICAL EXAM:  VITAL SIGNS:  Temperature equals 97.6.  Blood pressure  equals 100/78.  Pulse equals 56.  Respiratory rate equals 20.  O2 saturation  equals 99% on room air.  Subsequent blood pressure draws x3 were in the  90s/60s and 90s/70s range.  GENERALLY:  The patient is in no acute  distress.  She is somewhat lethargic.  HEENT:  Eyes are anicteric.  Extraocular muscles intact.  No conjunctivitis.  ENT:  No oropharyngeal erythema.  NECK:  Supple.  No carotid bruits.  No  lymphadenopathy.  RESPIRATORY:  Lungs are clear to auscultation bilaterally.  CV:  S1, S2, bradycardic.  No murmurs, rubs or gallops.  GI:  Soft, nontender, positive bowel sounds, no rebound, no guarding.  EXTREMITIES:  No edema, dorsalis pedis pulses are 2+ bilaterally.  SKIN:  No abnormal visible lesions, no jaundice.  LYMPH:  No  lymphadenopathy noted.  Patient has normal range of motion in  the upper and lower extremities.  NEURO:  The patient is alert and oriented x3, cranial nerves II-XII grossly  intact, extraocular muscles intact, sensation intact.  Patient is somewhat  lethargic and has a low tone of voice.   ADMISSION LABS:  Include a sodium of 142, potassium 3.3, chloride 109,  bicarb 27, BUN 19, creatinine 1.3, glucose 131, WBC equals 4.5, hemoglobin  equals 11.5, hematocrit equals 34.4, platelets equal 276, MCV equals 92.9,  anion gap equals 6, bilirubin equals 0.8, alkaline phosphatase equals 72,  AST equals 21, ALT equals 13, protein equals 6.5, albumin 3.5, calcium  equals 9.4, myoglobin POC equals 170, CK-MB POC equals 1.9, troponin I POC  equals less than 0.05.  A urine drug screen was negative.  EKG showed sinus  bradycardia.   HOSPITAL COURSE:  1. Iatrogenic hypotension, secondary to clonidine usage.  The patient was      started on normal saline 100 cc an hour, put on bed rest, and was put      on fall precautions.  Patient was also put on PAS hose for DVT      prophylaxis until she was able to ambulate.  The patient's blood      pressure was checked q.4 hours.  Patient hypotension responded quickly      to IV fluids.  Patient was instructed to not take her friend's     medications.  She will follow up with Dr. Okey Dupre in the Outpatient      Clinic to reevaluate her blood  pressure issues.  She was instructed to      only start her hydrochlorothiazide and lisinopril after her blood      pressure is greater than 140/80.  2. Hypokalemia.  Patient's potassium was repleted.  3. Abdominal pain/nausea.  The patient was given Zofran p.r.n. and  patient's discomfort quickly resolved.  4. History of hypertension.  Patient's home regimen of hydrochlorothiazide      and lisinopril were put on hold due to patient's iatrogenic      hypotension.  As mentioned she was instructed to take her medications      once her blood pressure was greater than 140/80.  5. Arthritis.  Patient is to continue taking her Tylenol p.r.n. for her      arthritic pain.  6. Depression.  Patient is currently taking amitriptyline for her      depression.  She currently denies SI and HI at this point.  We will      continue her current treatment regimen.  7. Microcytic anemia.  The decision to start iron supplementation may be      addressed at patient's followup visit.  Currently, her hemoglobin is      stable.   DISCHARGE LABS AND VITALS:  Vitals:  Temperature equal 98.0.  Pulse equals  55.  Respiration rate equals 19.  Blood pressure equals 128/76.  O2  saturation equals 100% on room air.  WBC was 4.2, hemoglobin equals 7.6,  hematocrit equals 31.1, platelets equal 243.  Sodium equals 140, potassium  equals 3.9, chloride equals 111, bicarb equals 26, BUN equals 16, creatinine  1.1, glucose equals 89 and calcium equals 9.0.      Rufina Falco, M.D.  Electronically Signed      Duncan Dull, M.D.  Electronically Signed    JY/MEDQ  D:  02/24/2006  T:  02/24/2006  Job:  [id number]   cc:   Cherylann Parr

## 2010-11-10 ENCOUNTER — Emergency Department (HOSPITAL_COMMUNITY): Payer: Medicare Other

## 2010-11-10 ENCOUNTER — Emergency Department (HOSPITAL_COMMUNITY)
Admission: EM | Admit: 2010-11-10 | Discharge: 2010-11-10 | Disposition: A | Discharge status: Home / Self Care | Payer: Medicare Other | Attending: Emergency Medicine | Admitting: Emergency Medicine

## 2010-11-10 DIAGNOSIS — K8050 Calculus of bile duct without cholangitis or cholecystitis without obstruction: Secondary | ICD-10-CM

## 2010-11-10 DIAGNOSIS — R630 Anorexia: Secondary | ICD-10-CM | POA: Insufficient documentation

## 2010-11-10 DIAGNOSIS — J45909 Unspecified asthma, uncomplicated: Secondary | ICD-10-CM | POA: Insufficient documentation

## 2010-11-10 DIAGNOSIS — F3289 Other specified depressive episodes: Secondary | ICD-10-CM | POA: Insufficient documentation

## 2010-11-10 DIAGNOSIS — Z79899 Other long term (current) drug therapy: Secondary | ICD-10-CM | POA: Insufficient documentation

## 2010-11-10 DIAGNOSIS — R112 Nausea with vomiting, unspecified: Secondary | ICD-10-CM | POA: Insufficient documentation

## 2010-11-10 DIAGNOSIS — K8020 Calculus of gallbladder without cholecystitis without obstruction: Secondary | ICD-10-CM | POA: Insufficient documentation

## 2010-11-10 DIAGNOSIS — R109 Unspecified abdominal pain: Secondary | ICD-10-CM | POA: Insufficient documentation

## 2010-11-10 DIAGNOSIS — F329 Major depressive disorder, single episode, unspecified: Secondary | ICD-10-CM | POA: Insufficient documentation

## 2010-11-10 DIAGNOSIS — K5900 Constipation, unspecified: Secondary | ICD-10-CM | POA: Insufficient documentation

## 2010-11-10 DIAGNOSIS — I10 Essential (primary) hypertension: Secondary | ICD-10-CM | POA: Insufficient documentation

## 2010-11-10 LAB — URINALYSIS, ROUTINE W REFLEX MICROSCOPIC
Bilirubin Urine: NEGATIVE
Urobilinogen, UA: 0.2 mg/dL (ref 0.0–1.0)

## 2010-11-10 LAB — BASIC METABOLIC PANEL
BUN: 16 mg/dL (ref 6–23)
Calcium: 9.6 mg/dL (ref 8.4–10.5)
Creatinine, Ser: 0.68 mg/dL (ref 0.4–1.2)
GFR calc Af Amer: >60 mL/min (ref >60)
Potassium: 3.8 mEq/L (ref 3.5–5.1)
Sodium: 138 mEq/L (ref 135–145)

## 2010-11-10 LAB — LIPASE, BLOOD: Lipase: 37 U/L (ref 11–59)

## 2010-11-10 LAB — CBC
HCT: 40.9 % (ref 36.0–46.0)
Hemoglobin: 13.9 g/dL (ref 12.0–15.0)
MCH: 30.9 pg (ref 26.0–34.0)
MCHC: 34 g/dL (ref 30.0–36.0)
Platelets: 353 10*3/uL (ref 150–400)
RBC: 4.5 MIL/uL (ref 3.87–5.11)
RDW: 13.9 % (ref 11.5–15.5)
WBC: 6.8 10*3/uL (ref 4.0–10.5)

## 2010-11-10 LAB — HEPATIC FUNCTION PANEL
ALT: 13 U/L (ref 0–35)
Albumin: 3.8 g/dL (ref 3.5–5.2)
Alkaline Phosphatase: 77 U/L (ref 39–117)
Bilirubin, Direct: 0.1 mg/dL (ref 0.0–0.3)
Total Bilirubin: 0.3 mg/dL (ref 0.3–1.2)

## 2010-11-10 LAB — URINE MICROSCOPIC-ADD ON

## 2010-11-10 LAB — DIFFERENTIAL
Eosinophils Absolute: 0.1 10*3/uL (ref 0.0–0.7)
Monocytes Relative: 3 % (ref 3–12)
Neutro Abs: 5.4 10*3/uL (ref 1.7–7.7)
Neutrophils Relative %: 80 % — ABNORMAL HIGH (ref 43–77)

## 2010-11-11 ENCOUNTER — Ambulatory Visit (HOSPITAL_COMMUNITY)
Admission: RE | Admit: 2010-11-11 | Discharge: 2010-11-11 | Disposition: A | Discharge status: Home / Self Care | Payer: Medicare Other | Source: Ambulatory Visit | Attending: Emergency Medicine | Admitting: Emergency Medicine

## 2010-11-11 DIAGNOSIS — K8020 Calculus of gallbladder without cholecystitis without obstruction: Secondary | ICD-10-CM | POA: Insufficient documentation

## 2010-11-11 DIAGNOSIS — R109 Unspecified abdominal pain: Secondary | ICD-10-CM | POA: Insufficient documentation

## 2010-11-13 ENCOUNTER — Encounter: Payer: Self-pay | Admitting: Internal Medicine

## 2010-11-27 ENCOUNTER — Ambulatory Visit (INDEPENDENT_AMBULATORY_CARE_PROVIDER_SITE_OTHER): Payer: Medicare Other | Admitting: Internal Medicine

## 2010-11-27 ENCOUNTER — Encounter: Payer: Self-pay | Admitting: Internal Medicine

## 2010-11-27 VITALS — BP 150/81 | HR 75 | Temp 97.9°F | Ht 64.0 in | Wt 154.7 lb

## 2010-11-27 DIAGNOSIS — I10 Essential (primary) hypertension: Secondary | ICD-10-CM

## 2010-11-27 DIAGNOSIS — F329 Major depressive disorder, single episode, unspecified: Secondary | ICD-10-CM

## 2010-11-27 DIAGNOSIS — K8020 Calculus of gallbladder without cholecystitis without obstruction: Secondary | ICD-10-CM | POA: Insufficient documentation

## 2010-11-27 MED ORDER — LISINOPRIL-HYDROCHLOROTHIAZIDE 20-25 MG PO TABS: 1.0000 | ORAL_TABLET | Freq: Every day | ORAL | Status: DC

## 2010-11-27 MED ORDER — FLUOXETINE HCL 20 MG PO CAPS: 20.0000 mg | ORAL_CAPSULE | Freq: Every day | ORAL | Status: DC

## 2010-11-27 NOTE — Assessment & Plan Note (Signed)
I will refill Prozac today.

## 2010-11-27 NOTE — Assessment & Plan Note (Signed)
She supposed to be on lisinopril HCTZ combination 20/25 daily. She's not taking her blood pressure pills. I will refill her medication and explained her to take it regularly.

## 2010-11-27 NOTE — Patient Instructions (Signed)
Please make a f/u appointment in 3-4 months with Dr. Allena Katz Please follow up with the surgeon for evaluation for Gall bladder surgery. Please take your medications regularly.

## 2010-11-27 NOTE — Assessment & Plan Note (Signed)
She was seen in the ED for biliary colic and was diagnosed with gallstones. She was unclear of her diagnosis and followup with [place] surgery. Explained her about the cause of her pain and to followup needed wit surgery for possible elective gallbladder removal. She will be called by the surgeon's office appointment day and time. I will see her back in 3 months or post surgery which are earlier.

## 2010-11-27 NOTE — Progress Notes (Signed)
  Subjective:    Patient ID: Deborah [name], female    DOB: 1945/07/14, 65 y.o.   MRN: [id number]  HPI Deborah [name] is a 65 year old woman with past with history of hypertension, asthma, depression who comes to the clinic for a followup visit after recent ED visit. She was seen in the ER 11/10/2010 for 3 days of biliary colic pain and was diagnosed with gallstones and enlarged CBD per CT abdomen and ultrasound abdomen. She was given the contact details for [facility] surgery, but she never called them. Also she is not taking her blood pressure medications regularly and her blood pressure is 150/81 today. Denies any nausea, vomiting, abdominal pain or diarrhea. Also denies any fever, chills, headache, chest pain, shortness of breath.    Review of Systems    as per history of present illness Objective:   Physical Exam    Constitutional: Vital signs reviewed.  Patient is a well-developed and well-nourished  in no acute distress and cooperative with exam.  Head: Normocephalic and atraumatic Ear: TM normal bilaterally Mouth: no erythema or exudates, MMM Eyes: PERRL, EOMI, conjunctivae normal, No scleral icterus.  Neck: Supple, Trachea midline normal ROM, No JVD, mass, thyromegaly, or carotid bruit present.  Cardiovascular: RRR, S1 normal, S2 normal, no MRG, pulses symmetric and intact bilaterally Pulmonary/Chest: CTAB, no wheezes, rales, or rhonchi Abdominal: Soft. Non-tender, non-distended, bowel sounds are normal, no masses, organomegaly, or guarding present.  GU: no CVA tenderness Musculoskeletal: No joint deformities, erythema, or stiffness, ROM full and no nontender Neurological: A&O x3, Strenght is normal and symmetric bilaterally, cranial nerve II-XII are grossly intact, no focal motor deficit, sensory intact to light touch bilaterally.  Skin: Warm, dry and intact. No rash, cyanosis, or clubbing.      Assessment & Plan:

## 2010-12-13 ENCOUNTER — Encounter (INDEPENDENT_AMBULATORY_CARE_PROVIDER_SITE_OTHER): Payer: Self-pay | Admitting: Surgery

## 2010-12-16 ENCOUNTER — Emergency Department (HOSPITAL_COMMUNITY)
Admission: EM | Admit: 2010-12-16 | Discharge: 2010-12-16 | Disposition: A | Discharge status: Home / Self Care | Payer: Medicare Other | Attending: Emergency Medicine | Admitting: Emergency Medicine

## 2010-12-16 DIAGNOSIS — Z0389 Encounter for observation for other suspected diseases and conditions ruled out: Secondary | ICD-10-CM | POA: Insufficient documentation

## 2011-01-03 ENCOUNTER — Ambulatory Visit (HOSPITAL_COMMUNITY)
Admission: RE | Admit: 2011-01-03 | Discharge: 2011-01-03 | Disposition: A | Discharge status: Home / Self Care | Payer: Medicare Other | Source: Ambulatory Visit | Attending: Surgery | Admitting: Surgery

## 2011-01-03 ENCOUNTER — Encounter (HOSPITAL_COMMUNITY)
Admit: 2011-01-03 | Discharge: 2011-01-03 | Disposition: A | Discharge status: Home / Self Care | Payer: Medicare Other | Attending: Surgery | Admitting: Surgery

## 2011-01-03 ENCOUNTER — Other Ambulatory Visit (INDEPENDENT_AMBULATORY_CARE_PROVIDER_SITE_OTHER): Payer: Self-pay | Admitting: Surgery

## 2011-01-03 DIAGNOSIS — R52 Pain, unspecified: Secondary | ICD-10-CM

## 2011-01-03 DIAGNOSIS — I517 Cardiomegaly: Secondary | ICD-10-CM | POA: Insufficient documentation

## 2011-01-03 DIAGNOSIS — Z01812 Encounter for preprocedural laboratory examination: Secondary | ICD-10-CM | POA: Insufficient documentation

## 2011-01-03 DIAGNOSIS — Z01818 Encounter for other preprocedural examination: Secondary | ICD-10-CM | POA: Insufficient documentation

## 2011-01-03 LAB — COMPREHENSIVE METABOLIC PANEL
ALT: 14 U/L (ref 0–35)
Albumin: 3.8 g/dL (ref 3.5–5.2)
Alkaline Phosphatase: 66 U/L (ref 39–117)
Glucose, Bld: 82 mg/dL (ref 70–99)
Potassium: 4.2 mEq/L (ref 3.5–5.1)
Sodium: 144 mEq/L (ref 135–145)
Total Protein: 6.8 g/dL (ref 6.0–8.3)

## 2011-01-03 LAB — CBC
Hemoglobin: 12.4 g/dL (ref 12.0–15.0)
MCHC: 33.2 g/dL (ref 30.0–36.0)
RDW: 14.4 % (ref 11.5–15.5)
WBC: 6.9 10*3/uL (ref 4.0–10.5)

## 2011-01-13 ENCOUNTER — Other Ambulatory Visit (INDEPENDENT_AMBULATORY_CARE_PROVIDER_SITE_OTHER): Payer: Self-pay | Admitting: Surgery

## 2011-01-13 ENCOUNTER — Ambulatory Visit (HOSPITAL_COMMUNITY)
Admission: RE | Admit: 2011-01-13 | Discharge: 2011-01-13 | Disposition: A | Discharge status: Home / Self Care | Payer: Medicare Other | Source: Ambulatory Visit | Attending: Surgery | Admitting: Surgery

## 2011-01-13 DIAGNOSIS — K8010 Calculus of gallbladder with chronic cholecystitis without obstruction: Secondary | ICD-10-CM

## 2011-01-13 DIAGNOSIS — F17200 Nicotine dependence, unspecified, uncomplicated: Secondary | ICD-10-CM | POA: Insufficient documentation

## 2011-01-13 DIAGNOSIS — Z8673 Personal history of transient ischemic attack (TIA), and cerebral infarction without residual deficits: Secondary | ICD-10-CM | POA: Insufficient documentation

## 2011-01-13 DIAGNOSIS — K8020 Calculus of gallbladder without cholecystitis without obstruction: Secondary | ICD-10-CM | POA: Insufficient documentation

## 2011-01-13 DIAGNOSIS — I10 Essential (primary) hypertension: Secondary | ICD-10-CM | POA: Insufficient documentation

## 2011-01-19 NOTE — Op Note (Signed)
Deborah [name], Deborah [name]                ACCOUNT NO.:  [id number]  MEDICAL RECORD NO.:  [id number]  LOCATION:  [facility]                         FACILITY:  [facility]  PHYSICIAN:  Abigail Miyamoto, M.D. DATE OF BIRTH:  [birth year]  DATE OF PROCEDURE:  01/13/2011 DATE OF DISCHARGE:  01/13/2011                              OPERATIVE REPORT   PREOPERATIVE DIAGNOSIS:  Symptomatic cholelithiasis.  POSTOPERATIVE DIAGNOSIS:  Symptomatic cholelithiasis.  PROCEDURE:  Laparoscopic cholecystectomy.  SURGEON:  Abigail Miyamoto, MD  ANESTHESIA:  General and 0.5% Marcaine.  ESTIMATED BLOOD LOSS:  Minimal.  FINDINGS:  The patient was found to have a chronically scarred-appearing gallbladder with gallstones.  PROCEDURE IN DETAIL:  The patient was brought to the operating room and identified as Deborah [name].  She was placed supine on the operating room table and general anesthesia was induced.  Her abdomen was then prepped and draped in the usual sterile fashion.  Using a #15 blade, a small vertical incision was made below the umbilicus.  This was carried down to the fascia which was then opened with scalpel.  Hemostat was then used to pass the peritoneal cavity under direct vision.  A 0 Vicryl suture was then placed around the fascial opening.  The Hasson port was placed through the opening and insufflation of the abdomen was begun.  A 5-mm port was then placed in the patient's epigastrium and two more were placed in the right upper quadrant, all under direct vision.  Several adhesions from the dome of the liver to the diaphragm were taken down with the laparoscopic scissors.  The gallbladder was then identified and elevated and was found to be thick walled in appearance.  There were adhesions to the gallbladder which I took down with both blunt and sharp dissection.  I then had to peel the duodenum off the gallbladder.  I was able to dissect the hilum of the gallbladder.  I identified the cystic artery  which was slightly anterior.  I achieved a critical window around it and clipped it twice proximally and once distally and transected it. The cystic artery was identified and again a critical window was achieved around the cystic duct and clipped it three times proximally and once distally and transected it with scissors.  The gallbladder was slowly dissected free from the liver bed with electrocautery.  Once it was free from the liver bed, it was removed through the incision at the umbilicus.  Hemostasis was achieved in the liver bed with electrocautery.  I then thoroughly irrigated the abdomen with normal saline.  Again, hemostasis appeared to be achieved.  The 0 Vicryl at the umbilicus was tied in place closing the fascial defect.  All ports were then removed under direct vision and the abdomen was deflated.  All incisions were then anesthetized with Marcaine.  I placed another 0 Vicryl suture at the umbilicus to ensure the fascial closure.  All skin incisions were then closed with 4-0 Monocryl. Steri-Strips and Band-Aids were then applied.  The patient tolerated the procedure well.  All counts were correct at the end of the procedure. The patient was extubated in the operating room and taken in stable condition to the recovery  room.     Abigail Miyamoto, M.D.     DB/MEDQ  D:  01/13/2011  T:  01/13/2011  Job:  [id number]  Electronically Signed by Abigail Miyamoto M.D. on 01/19/2011 02:04:38 PM

## 2011-01-22 ENCOUNTER — Encounter: Payer: Medicare Other | Admitting: Internal Medicine

## 2011-02-03 ENCOUNTER — Ambulatory Visit (INDEPENDENT_AMBULATORY_CARE_PROVIDER_SITE_OTHER): Payer: Medicare Other | Admitting: Surgery

## 2011-02-03 ENCOUNTER — Encounter (INDEPENDENT_AMBULATORY_CARE_PROVIDER_SITE_OTHER): Payer: Self-pay | Admitting: Surgery

## 2011-02-03 DIAGNOSIS — Z09 Encounter for follow-up examination after completed treatment for conditions other than malignant neoplasm: Secondary | ICD-10-CM | POA: Insufficient documentation

## 2011-02-03 NOTE — Progress Notes (Signed)
Subjective:     Patient ID: Deborah [name], female   DOB: July 23, 1945, 65 y.o.   MRN: [id number]  HPI She is here for her first postoperative visit status post laparoscopic cholecystectomy. She is doing well and has no complaints. She is eating well and moving her bowels well.  Review of Systems     Objective:   Physical Exam On exam, her incisions are healing well. Her pathology showed chronic cholecystitis with gallstones    Assessment:     Patient doing well status post laparoscopic cholecystectomy    Plan:     She will followup as needed. She may return to normal activity.

## 2011-02-12 ENCOUNTER — Ambulatory Visit (INDEPENDENT_AMBULATORY_CARE_PROVIDER_SITE_OTHER): Payer: Medicare Other | Admitting: Internal Medicine

## 2011-02-12 ENCOUNTER — Encounter: Payer: Self-pay | Admitting: Internal Medicine

## 2011-02-12 VITALS — BP 171/94 | HR 63 | Temp 98.2°F | Ht 64.0 in | Wt 151.7 lb

## 2011-02-12 DIAGNOSIS — I10 Essential (primary) hypertension: Secondary | ICD-10-CM

## 2011-02-12 DIAGNOSIS — K8020 Calculus of gallbladder without cholecystitis without obstruction: Secondary | ICD-10-CM

## 2011-02-12 DIAGNOSIS — Z23 Encounter for immunization: Secondary | ICD-10-CM

## 2011-02-12 DIAGNOSIS — M79609 Pain in unspecified limb: Secondary | ICD-10-CM

## 2011-02-12 DIAGNOSIS — M21619 Bunion of unspecified foot: Secondary | ICD-10-CM

## 2011-02-12 DIAGNOSIS — Z09 Encounter for follow-up examination after completed treatment for conditions other than malignant neoplasm: Secondary | ICD-10-CM

## 2011-02-12 DIAGNOSIS — M79673 Pain in unspecified foot: Secondary | ICD-10-CM

## 2011-02-12 MED ORDER — LISINOPRIL-HYDROCHLOROTHIAZIDE 20-25 MG PO TABS: 1.0000 | ORAL_TABLET | Freq: Every day | ORAL | Status: DC

## 2011-02-12 MED ORDER — AMLODIPINE BESYLATE 5 MG PO TABS: 5.0000 mg | ORAL_TABLET | Freq: Every day | ORAL | Status: DC

## 2011-02-12 NOTE — Patient Instructions (Signed)
You were seen today for a check up after your surgery. Your blood pressure is a little bit high today. It is 171/91. We will add another blood pressure medicine to you. It is called amlodipine or norvasc 5 mg. Take it once daily. We gave you a pneumonia vaccine today. It is for people 24 and older and is a one time shot to help prevent pneumonia. We will see you back in 1 month to check your blood pressure. Please try to stop smoking. If you have any problems or feel you need to be seen back sooner please feel free to call our office. Our number is [phone].

## 2011-02-12 NOTE — Assessment & Plan Note (Signed)
Patient is doing well postop. She is able to resume all activities of daily living. She is not having any pain. No fevers, no chills.

## 2011-02-12 NOTE — Assessment & Plan Note (Signed)
Patient did have gallbladder surgery recently to remove the gallbladder. She is not having any abdominal pain, change in activity, fevers, chills, swelling.

## 2011-02-12 NOTE — Assessment & Plan Note (Signed)
Patient has been taking her lisinopril HCTZ 20/25 milligrams pill daily. She states she did not take it today. Her blood pressure today is 171/94. We did add amlodipine 5 mg daily today. We will see her back in one month for blood pressure check. I did explain to her the seriousness of blood pressure being high. I did advise her to continue taking her lisinopril HCTZ daily and to start taking this amlodipine daily. No lab work today as she had recent lab work on 01/09/2011.

## 2011-02-12 NOTE — Assessment & Plan Note (Signed)
Patient would like to go back to podiatry for her feet. I have made that referral today.

## 2011-02-12 NOTE — Progress Notes (Signed)
  Subjective:    Patient ID: Deborah [name], female    DOB: 1945/08/31, 65 y.o.   MRN: [id number]  HPI: Patient is a 65 year old female comes in today for a postop check a gallbladder removal. She also has a history of high blood pressure, depression, asthma. She does currently use one cigarette per day. I did counsel her on reducing this to 0 cigarettes per day. She states that the surgery went well, she's not having abdominal pain. She has been able to resume all the activities of daily living. She states she did not take her blood pressure medicine today, however has been taking it regularly. She is not out of any of her medications. She is not taking a daily aspirin. She has no other complaints at this time. No shortness of breath no chest pain no headaches no fevers no chills no abdominal pain.    Review of Systems  Constitutional: Negative for fever, chills, diaphoresis, activity change, appetite change, fatigue and unexpected weight change.  HENT: Negative.   Eyes: Negative.   Respiratory: Negative.  Negative for cough, choking, chest tightness, shortness of breath and wheezing.   Cardiovascular: Negative.  Negative for chest pain and leg swelling.  Gastrointestinal: Negative.  Negative for nausea, vomiting, abdominal pain, diarrhea, constipation and abdominal distention.  Musculoskeletal: Negative.   Skin: Negative.   Neurological: Negative.   Hematological: Negative.   Psychiatric/Behavioral: Negative.     Vitals: Blood pressure: 171/94 Pulse: 63 Temperature: 98.17F    Objective:   Physical Exam  Constitutional: She is oriented to person, place, and time. She appears well-developed and well-nourished.  HENT:  Head: Normocephalic and atraumatic.  Eyes: EOM are normal. Pupils are equal, round, and reactive to light.  Neck: Normal range of motion. Neck supple. No tracheal deviation present. No thyromegaly present.  Cardiovascular: Normal rate, regular rhythm and normal heart  sounds.   Pulmonary/Chest: Effort normal and breath sounds normal. No respiratory distress. She has no wheezes. She has no rales. She exhibits no tenderness.  Abdominal: Soft. Bowel sounds are normal. She exhibits no distension and no mass. There is no tenderness. There is no rebound and no guarding.       Surgical site was clean dry and not looking infected.  Musculoskeletal: Normal range of motion. She exhibits no edema and no tenderness.  Lymphadenopathy:    She has no cervical adenopathy.  Neurological: She is alert and oriented to person, place, and time. No cranial nerve deficit.  Skin: Skin is warm and dry. No rash noted. No erythema. No pallor.  Psychiatric: She has a normal mood and affect. Her behavior is normal. Judgment and thought content normal.          Assessment & Plan:  1. Please see problem-oriented charting.  2. Other medical problems not discussed at today's visit: Anxiety, history of CVA, asthma, hematuria, degenerative joint disease of the back.  3. Disposition-patient will be seen back in one month followup on blood pressure. We did start Norvasc 5 mg daily. We did administer pneumonia vaccine today. I have given the patient information about our office so that she can call if she is having any problems. I did discuss smoking cessation with her, and encourage her to stop smoking.

## 2011-02-25 ENCOUNTER — Encounter: Payer: Self-pay | Admitting: Internal Medicine

## 2011-02-28 ENCOUNTER — Encounter: Payer: Self-pay | Admitting: Internal Medicine

## 2011-02-28 ENCOUNTER — Ambulatory Visit (INDEPENDENT_AMBULATORY_CARE_PROVIDER_SITE_OTHER): Payer: Medicare Other | Admitting: Internal Medicine

## 2011-02-28 VITALS — BP 138/89 | HR 60 | Temp 97.3°F | Ht 64.0 in | Wt 148.5 lb

## 2011-02-28 DIAGNOSIS — F17200 Nicotine dependence, unspecified, uncomplicated: Secondary | ICD-10-CM

## 2011-02-28 DIAGNOSIS — I10 Essential (primary) hypertension: Secondary | ICD-10-CM

## 2011-02-28 DIAGNOSIS — M170 Bilateral primary osteoarthritis of knee: Secondary | ICD-10-CM | POA: Insufficient documentation

## 2011-02-28 DIAGNOSIS — F329 Major depressive disorder, single episode, unspecified: Secondary | ICD-10-CM

## 2011-02-28 DIAGNOSIS — K8020 Calculus of gallbladder without cholecystitis without obstruction: Secondary | ICD-10-CM

## 2011-02-28 DIAGNOSIS — M25561 Pain in right knee: Secondary | ICD-10-CM

## 2011-02-28 DIAGNOSIS — M25569 Pain in unspecified knee: Secondary | ICD-10-CM

## 2011-02-28 MED ORDER — NAPROXEN SODIUM 220 MG PO TABS: 220.0000 mg | ORAL_TABLET | Freq: Two times a day (BID) | ORAL | Status: DC

## 2011-02-28 MED ORDER — FLUOXETINE HCL 20 MG PO CAPS
20.0000 mg | ORAL_CAPSULE | Freq: Every day | ORAL | Status: DC
Start: 2011-02-28 — End: 2011-10-20

## 2011-02-28 NOTE — Patient Instructions (Signed)
Please make an appointment in 4-5 months. Please take Naproxen 1 tablet twice daily as needed for your knee pain- with meals. Please continue exercise.

## 2011-02-28 NOTE — Assessment & Plan Note (Signed)
She says she smokes one cigarette today. It also said that she was advised to go 0 by Dr. Dorise Hiss in last visit-and she is trying. Counsel her for adverse effects with even one cigarette-she was understanding and said that she'll try to quit.

## 2011-02-28 NOTE — Assessment & Plan Note (Addendum)
Minimal bilateral knee pain for about a year now. Does not have a diagnosis of arthritis at present. Exam not significant for any infectious process. Give her prescription for indomethacin and advised her to take 220 mg twice a day when necessary with meals. If needed would do imaging and further testing during next visit.

## 2011-02-28 NOTE — Progress Notes (Signed)
  Subjective:    Patient ID: Deborah [name], female    DOB: 1946/06/18, 65 y.o.   MRN: [id number]  HPI Deborah [name] is a pleasant 65 year lady with past with history of hypertension, recent laparoscopic cholecystectomy who comes to the clinic for regular followup visit. She had one followup appointment with Dr. Anson Oregon after surgery on 02/03/2011 and she was doing at that time. Also she was followed in our clinic after the surgery and had no complaints. She has no significant complaints today and feels much better. No abdominal pain, nausea, vomiting, diarrhea. She does complain of bilateral knee pain-after she fell down last year. The pain is minimal and gets exacerbated with walking. No redness, swelling or tenderness on exam of bilateral knees. Denies any depression symptoms or suicidal ideations. Denies any fever, chills, chest pain, headache, short of breath.  Review of Systems    as per history of present illness, all other systems reviewed and negative. Objective:   Physical Exam Constitutional: Vital signs reviewed.  Patient is a well-developed and well-nourished in no acute distress and cooperative with exam. Alert and oriented x3.  Head: Normocephalic and atraumatic Mouth: no erythema or exudates, MMM Eyes: PERRL, EOMI, conjunctivae normal, No scleral icterus.  Neck: Supple, Trachea midline normal ROM, No JVD Cardiovascular: RRR, S1 normal, S2 normal, no MRG, pulses symmetric and intact bilaterally Pulmonary/Chest: CTAB, no wheezes, rales, or rhonchi Abdominal: Soft. Non-tender, non-distended, bowel sounds are normal, no masses, organomegaly, or guarding present.  Musculoskeletal: No redness, swelling or tenderness on exam of bilateral knees. Neurological: A&O x3, Strenght is normal and symmetric bilaterally, cranial nerve II-XII are grossly intact, no focal motor deficit, sensory intact to light touch bilaterally.  Skin: Warm, dry and intact. No rash, cyanosis, or clubbing.    Psychiatric: Normal mood and affect. speech and behavior is normal. Judgment and thought content normal. Cognition and memory are normal.          Assessment & Plan:

## 2011-02-28 NOTE — Assessment & Plan Note (Signed)
Blood pressure within normal limits today. Continue same medications.

## 2011-02-28 NOTE — Assessment & Plan Note (Signed)
No postop complications at present. She will have a followup appointment with Dr. Magnus Ivan.

## 2011-03-31 LAB — DIFFERENTIAL
Basophils Absolute: 0
Basophils Relative: 1
Eosinophils Absolute: 0.2
Eosinophils Relative: 4
Lymphocytes Relative: 35
Lymphs Abs: 1.7
Monocytes Absolute: 0.5
Monocytes Relative: 10
Neutro Abs: 2.6
Neutrophils Relative %: 51

## 2011-03-31 LAB — POCT CARDIAC MARKERS
Myoglobin, poc: 60.4
Operator id: 198171

## 2011-03-31 LAB — CBC
HCT: 37
Hemoglobin: 12.5
MCHC: 33.8
MCV: 93.3
Platelets: 288
RBC: 3.97
RDW: 14.3
WBC: 5

## 2011-03-31 LAB — I-STAT 8, (EC8 V) (CONVERTED LAB)
Acid-base deficit: 2
BUN: 15
Bicarbonate: 23.4
Chloride: 111
Glucose, Bld: 93
HCT: 41
Hemoglobin: 13.9
Operator id: 198171
Potassium: 3.8
Sodium: 143
TCO2: 25
pCO2, Ven: 43.1 — ABNORMAL LOW
pH, Ven: 7.342 — ABNORMAL HIGH

## 2011-03-31 LAB — POCT I-STAT CREATININE
Creatinine, Ser: 0.7
Operator id: 198171

## 2011-04-01 ENCOUNTER — Ambulatory Visit (HOSPITAL_COMMUNITY)
Admission: RE | Admit: 2011-04-01 | Discharge: 2011-04-01 | Disposition: A | Discharge status: Home / Self Care | Payer: Medicare Other | Source: Ambulatory Visit | Attending: Oral Surgery | Admitting: Oral Surgery

## 2011-04-01 DIAGNOSIS — M278 Other specified diseases of jaws: Secondary | ICD-10-CM | POA: Insufficient documentation

## 2011-04-01 DIAGNOSIS — Z8673 Personal history of transient ischemic attack (TIA), and cerebral infarction without residual deficits: Secondary | ICD-10-CM | POA: Insufficient documentation

## 2011-04-01 DIAGNOSIS — I10 Essential (primary) hypertension: Secondary | ICD-10-CM | POA: Insufficient documentation

## 2011-04-01 DIAGNOSIS — K029 Dental caries, unspecified: Secondary | ICD-10-CM | POA: Insufficient documentation

## 2011-04-01 DIAGNOSIS — Z01812 Encounter for preprocedural laboratory examination: Secondary | ICD-10-CM | POA: Insufficient documentation

## 2011-04-01 DIAGNOSIS — E119 Type 2 diabetes mellitus without complications: Secondary | ICD-10-CM | POA: Insufficient documentation

## 2011-04-01 DIAGNOSIS — J45909 Unspecified asthma, uncomplicated: Secondary | ICD-10-CM | POA: Insufficient documentation

## 2011-04-01 LAB — BASIC METABOLIC PANEL
BUN: 19 mg/dL (ref 6–23)
CO2: 32 mEq/L (ref 19–32)
Calcium: 10 mg/dL (ref 8.4–10.5)
Creatinine, Ser: 1.07 mg/dL (ref 0.50–1.10)
Glucose, Bld: 109 mg/dL — ABNORMAL HIGH (ref 70–99)

## 2011-04-01 LAB — CBC
MCH: 30.2 pg (ref 26.0–34.0)
MCV: 90.5 fL (ref 78.0–100.0)
Platelets: 428 10*3/uL — ABNORMAL HIGH (ref 150–400)
RBC: 4.2 MIL/uL (ref 3.87–5.11)

## 2011-04-01 LAB — GLUCOSE, CAPILLARY: Glucose-Capillary: 96 mg/dL (ref 70–99)

## 2011-04-03 NOTE — Op Note (Signed)
Deborah [name], [name]                ACCOUNT NO.:  [id number]  MEDICAL RECORD NO.:  [id number]  LOCATION:  [facility]                         FACILITY:  [facility]  PHYSICIAN:  Georgia Lopes, M.D.  DATE OF BIRTH:  1945-10-29  DATE OF PROCEDURE:  04/01/2011 DATE OF DISCHARGE:  04/01/2011                              OPERATIVE REPORT   PREOPERATIVE DIAGNOSES:  Nonrestorable teeth numbers 8, 12, 14, 17, 18, 20, 28, and 31, bilateral mandibular tori.  POSTOPERATIVE DIAGNOSES:  Nonrestorable teeth numbers 8, 12, 14, 17, 18, 20, 28, and 31, bilateral mandibular tori.  PROCEDURES:  Extraction of teeth numbers 8, 12, 14, 17, 18, 20, 28, and 31, alveoplasty, removal of bilateral mandibular lingual tori.  SURGEON:  Georgia Lopes, MD  ANESTHESIA:  General nasal, Dr. Sondra Come attending.  ASSISTANTS: 1. Luberta Mutter, DOMA 2. Arlee Muslim, DA  INDICATIONS FOR PROCEDURE:  Deborah [name] is a 65 year old female who was referred to me by her general dentist for extraction of multiple nonrestorable teeth secondary to dental caries.  She has a past medical history of hypertension, asthma, non-insulin-dependent diabetes, status post CVA in 34.  She is a former tobacco and alcohol user.  Because of the number of teeth to be removed and the extensiveness of the surgery, it was recommended that she undergo general anesthesia for the procedure.  PROCEDURE:  The patient was taken to the operating room and placed on the table in supine position.  General anesthesia was administered intravenously and a nasal endotracheal tube was placed and secured.  The eyes were protected.  The patient was draped for the procedure.  The posterior pharynx was suctioned and the throat pack was placed. Lidocaine 2% 1:100,000 epinephrine was infiltrated in an inferior alveolar block on the right and left side and a buccal and palatal infiltration around the maxillary teeth to be removed, total of 10 mL was utilized.  A bite block  was placed on the right side and a Sweetheart retractor was used to retract the tongue.  A 15-blade was used to make a full-thickness incision around teeth numbers 17 and 18, and carried anteriorly to tooth number 20.  Dissection was carried lingual to teeth numbers 21 and 22.  The periosteum was reflected with a periosteal elevator.  The teeth numbers 17, 18 and 20 were elevated with a 301 elevator and tooth number 18 was removed using the Cowhorn forceps, tooth number 20 was removed using the Ash dental forceps and tooth number 17 was removed using the 301 elevator.  Then, the sockets were curetted.  The periosteum was further reflected buckling and lingually to expose the alveolar crest and the mandibular tori.  The egg- shaped bur was then used to perform the alveoplasty and the torus removal on the left side.  Then, the bone file was used to further smooth the bone and the area was irrigated and closed with 3-0 chromic. In the left maxilla, a 15-blade was used to make an incision around teeth number 12 and carried to tooth number 14.  The periosteum was reflected.  The teeth were elevated with a 301 elevator and removed from the mouth with the upper universal forceps.  The sockets were then curetted, the periosteum was further reflected and alveoplasty was performed using the egg-shaped bur and the bone file.  The area was then irrigated and closed with 3-0 chromic.  The bite block and Sweetheart retractor were repositioned to the opposite side of the mouth.  A 15- blade was used to make an incision around teeth number 28 and carried posteriorly to tooth number 31.  The infection was also created anteriorly on the lingual aspect of tooth number 27, then the periosteum was reflected and teeth numbers 28 and 31 were elevated with a 301 elevator and removed from the mouth with a dental forceps.  The sockets were curetted and then, the periosteum was further reflected to expose the  alveolar crest and the lingual exostosis or torus.  Then, the egg- shaped bur and the bone file were used to remove the lingual torus, smooth the area and to perform the alveoplasty.  The area was irrigated and then closed with 3-0 chromic.  A full-thickness incision was created around tooth number 8 with a 15-blade.  The periosteum was reflected with periosteal elevator.  The tooth was elevated with a 301 elevator and then removed from the mouth with the upper universal forceps.  Then, the oral cavity was irrigated, examined and found to have good closure, contour and hemostasis.  The oral cavity was suctioned.  A throat pack was removed.  The patient was taken to the recovery room and breathing spontaneously in good condition.  ESTIMATED BLOOD LOSS:  Minimum.  COMPLICATIONS:  None.  SPECIMENS:  None.     Georgia Lopes, M.D.     SMJ/MEDQ  D:  04/01/2011  T:  04/01/2011  Job:  [id number]  Electronically Signed by Ocie Doyne M.D. on 04/03/2011 03:17:34 PM

## 2011-04-30 ENCOUNTER — Ambulatory Visit (INDEPENDENT_AMBULATORY_CARE_PROVIDER_SITE_OTHER): Payer: Medicare Other | Admitting: Internal Medicine

## 2011-04-30 ENCOUNTER — Ambulatory Visit (HOSPITAL_COMMUNITY)
Admission: RE | Admit: 2011-04-30 | Discharge: 2011-04-30 | Disposition: A | Discharge status: Home / Self Care | Payer: Medicare Other | Source: Ambulatory Visit | Attending: Internal Medicine | Admitting: Internal Medicine

## 2011-04-30 ENCOUNTER — Encounter: Payer: Self-pay | Admitting: Internal Medicine

## 2011-04-30 DIAGNOSIS — M25569 Pain in unspecified knee: Secondary | ICD-10-CM | POA: Insufficient documentation

## 2011-04-30 DIAGNOSIS — I10 Essential (primary) hypertension: Secondary | ICD-10-CM

## 2011-04-30 DIAGNOSIS — M25562 Pain in left knee: Secondary | ICD-10-CM

## 2011-04-30 DIAGNOSIS — M25561 Pain in right knee: Secondary | ICD-10-CM

## 2011-04-30 DIAGNOSIS — F17200 Nicotine dependence, unspecified, uncomplicated: Secondary | ICD-10-CM

## 2011-04-30 MED ORDER — HYDROXYZINE PAMOATE 25 MG PO CAPS: 25.0000 mg | ORAL_CAPSULE | Freq: Every evening | ORAL | Status: DC | PRN

## 2011-04-30 MED ORDER — ACETAMINOPHEN-CODEINE 300-30 MG PO TABS: 1.0000 | ORAL_TABLET | Freq: Two times a day (BID) | ORAL | Status: DC | PRN

## 2011-04-30 NOTE — Assessment & Plan Note (Signed)
Lab Results  Component Value Date   NA 142 04/01/2011   K 3.5 04/01/2011   CL 101 04/01/2011   CO2 32 04/01/2011   BUN 19 04/01/2011   CREATININE 1.07 04/01/2011    BP Readings from Last 3 Encounters:  04/30/11 137/84  02/28/11 138/89  02/12/11 171/94    Assessment: Hypertension control:  controlled  Progress toward goals:  at goal Barriers to meeting goals:  no barriers identified  Plan: Hypertension treatment:  continue current medications

## 2011-04-30 NOTE — Assessment & Plan Note (Signed)
Quit smoking for last 2 months.

## 2011-04-30 NOTE — Patient Instructions (Signed)
Please make followup appointment in 2 weeks with Dr. Allena Katz. We will discuss x-ray results at that time and further management for knee pain. Please get the x-rays of her knee done today and I will give you a call if needed before the next appointment. Please take Tylenol 3 meanwhile for your pain. Also take all the medications regularly.

## 2011-04-30 NOTE — Assessment & Plan Note (Signed)
Worsening bilateral knee pain right more than left since last one year after she had a fall at the same time she broke her tooth also. She has intermittent swelling of her right knee-which is minimally swollen today with no redness, warmth. I suspect meniscal or ligament injury the time of the fall a year before. I will start evaluation with bilateral knee x-rays and will give her Tylenol No. 3 for pain for 2 weeks- after which I will see her back and if her pain persists without/with any significant x-ray findings -will consider referring her to sports medicine clinic. I discussed this with her and she agreed with the plan

## 2011-04-30 NOTE — Progress Notes (Signed)
  Subjective:    Patient ID: Deborah [name], female    DOB: 09/21/1945, 65 y.o.   MRN: [id number]  HPI Deborah [name] is a pleasant 65 year old woman with past with history of hypertension, CVA, bilateral knee pain for last one year who comes the clinic for continuing knee pain. I saw her in September 2012 and prescribed her indomethacin when necessary for pain which did not help her. Her pain is getting worse in her right knee and she complains of worsening pain with flexion of right knee more than 90 and full extension. Left knee also gets exacerbated with full flexion. Walking makes the pain worse, so does any significant movement of right knee. She Complains of intermittent swelling of right knee which subsides by itself. She took Percocet for pain which he had from her prescription for recent dental extraction surgery- which did help her for pain. She denies any fever, chills, nausea vomiting, abdominal pain,  chest pain, short of breath, headache, diarrhea.    Review of Systems    as per history of present illness, all other systems reviewed and negative. Objective:   Physical Exam Vitals: Reviewed and stable General: NAD HEENT: PERRL, EOMI, no scleral icterus Cardiac: S1, S2, RRR, no rubs, murmurs or gallops Pulm: clear to auscultation bilaterally, moving normal volumes of air Abd: soft, nontender, nondistended, BS present Ext: Bilateral knee minimal tenderness to palpation anteriorly and in popliteal fossa, severe pain with flexion more than 90 of right knee and left knee. Has minimal swelling of bilateral knees right more than left. no pedal edema Neuro: alert and oriented X3, cranial nerves II-XII grossly intact, strength and sensation to light touch equal in bilateral upper and lower extremities        Assessment & Plan:

## 2011-05-14 ENCOUNTER — Encounter: Payer: Self-pay | Admitting: Internal Medicine

## 2011-05-14 ENCOUNTER — Ambulatory Visit (INDEPENDENT_AMBULATORY_CARE_PROVIDER_SITE_OTHER): Payer: Medicare Other | Admitting: Internal Medicine

## 2011-05-14 DIAGNOSIS — F17200 Nicotine dependence, unspecified, uncomplicated: Secondary | ICD-10-CM

## 2011-05-14 DIAGNOSIS — M25561 Pain in right knee: Secondary | ICD-10-CM

## 2011-05-14 DIAGNOSIS — I10 Essential (primary) hypertension: Secondary | ICD-10-CM

## 2011-05-14 DIAGNOSIS — M25569 Pain in unspecified knee: Secondary | ICD-10-CM

## 2011-05-14 NOTE — Progress Notes (Signed)
  Subjective:    Patient ID: Deborah [name], female    DOB: January 24, 1946, 65 y.o.   MRN: [id number]  HPI [name] is a pleasant 65-year-old woman with past with history of HTN, CVA, recently diagnosed osteoarthritis of both knees who comes the clinic for followup visit. I saw her 2 weeks before and she was having severe pain of right knee with mild swelling. X-rays of both knees shows tricompartmental degenerative changes consistent with osteoarthritis. Though her pain started after she had a for year before, I suspect this is primary osteoarthritis which just got noticed with the fall. Her pain is much better today compared to the previous visit and is controlled with ibuprofen. I prescribe her Tylenol 3 during last visit but she misplaced the prescription and did not get it refilled until today. I will advise her to use it only if pain is really severe. She looks better and has no gait abnormalities. She denies any fever, chills, nausea, vomiting, or abdominal pain, diarrhea, constipation, chest pain, shortness of breath.    Review of Systems    as per history of present illness, all other systems reviewed and negative. Objective:   Physical Exam Vitals: Reviewed and stable. General: NAD HEENT: PERRL, EOMI, no scleral icterus Cardiac: RRR, no rubs, murmurs or gallops Pulm: clear to auscultation bilaterally, moving normal volumes of air Abd: soft, nontender, nondistended, BS present Ext: warm and well perfused, no pedal edema. No knee swelling or tenderness. Neuro: alert and oriented X3, cranial nerves II-XII grossly intact, strength and sensation to light touch equal in bilateral upper and lower extremities        Assessment & Plan:

## 2011-05-14 NOTE — Assessment & Plan Note (Signed)
Lab Results  Component Value Date   NA 142 04/01/2011   K 3.5 04/01/2011   CL 101 04/01/2011   CO2 32 04/01/2011   BUN 19 04/01/2011   CREATININE 1.07 04/01/2011    BP Readings from Last 3 Encounters:  05/14/11 120/77  04/30/11 137/84  02/28/11 138/89    Assessment: Hypertension control:  controlled  Progress toward goals:  at goal Barriers to meeting goals:  no barriers identified  Plan: Hypertension treatment:  continue current medications

## 2011-05-14 NOTE — Patient Instructions (Signed)
Please make a followup appointment in 5-6 months. If anything comes up meanwhile, make an early appointment. Please take Tylenol, ibuprofen as needed for your knee pain. Also continue walking every day which would help your pain.

## 2011-05-14 NOTE — Assessment & Plan Note (Signed)
Bilateral knee x-rays done during last visit 2 weeks before shows tricompartmental changes consistent with osteoarthritis in both knees. As explained in history of present illness physical exam, Deborah [name] feels much better today and swelling has disappeared in right knee. I gave her 3 tablets of Tylenol from clinic today as she didn't have money to buy any now. Though she has ibuprofen at home which he takes as needed for pain. Advised her to use Tylenol 3 which I prescribed her during the last visit only if the pain is severe. I would postpone referred to sports medicine clinic for now as I don't see any indication for that. If she would have frequent exacerbation of her knee pain, I would recommend physical therapy and some weight loss and consider sports medicine clinic referral for further management in future. Discussed the plan with her and she is in agreement.

## 2011-05-14 NOTE — Assessment & Plan Note (Signed)
Quit smoking about 2 and a half months ago. Congratulated again and encouraged her to not smoke again.

## 2011-07-02 ENCOUNTER — Ambulatory Visit (INDEPENDENT_AMBULATORY_CARE_PROVIDER_SITE_OTHER): Payer: Medicare Other | Admitting: Internal Medicine

## 2011-07-02 ENCOUNTER — Encounter: Payer: Self-pay | Admitting: *Deleted

## 2011-07-02 ENCOUNTER — Encounter: Payer: Self-pay | Admitting: Internal Medicine

## 2011-07-02 ENCOUNTER — Other Ambulatory Visit: Payer: Self-pay | Admitting: *Deleted

## 2011-07-02 VITALS — BP 167/86 | HR 73 | Temp 99.5°F | Ht 64.0 in | Wt 160.0 lb

## 2011-07-02 DIAGNOSIS — M25569 Pain in unspecified knee: Secondary | ICD-10-CM

## 2011-07-02 DIAGNOSIS — I10 Essential (primary) hypertension: Secondary | ICD-10-CM

## 2011-07-02 DIAGNOSIS — M1710 Unilateral primary osteoarthritis, unspecified knee: Secondary | ICD-10-CM

## 2011-07-02 DIAGNOSIS — M25562 Pain in left knee: Secondary | ICD-10-CM

## 2011-07-02 DIAGNOSIS — M170 Bilateral primary osteoarthritis of knee: Secondary | ICD-10-CM

## 2011-07-02 MED ORDER — AMLODIPINE BESYLATE 5 MG PO TABS: 5.0000 mg | ORAL_TABLET | Freq: Every day | ORAL | Status: DC

## 2011-07-02 MED ORDER — ACETAMINOPHEN-CODEINE 300-30 MG PO TABS: 1.0000 | ORAL_TABLET | Freq: Two times a day (BID) | ORAL | Status: AC | PRN

## 2011-07-02 NOTE — Progress Notes (Signed)
I agree with Dr. Wildman-Tobriner's assessment and plan. 

## 2011-07-02 NOTE — Progress Notes (Signed)
Subjective:     Patient ID: Deborah [name], female   DOB: 21-Apr-1946, 66 y.o.   MRN: [id number]  HPI Pt is a very pleasant 66 yo F with h/o  Patient Active Problem List  Diagnoses  . TOBACCO ABUSE  . DEPRESSIVE DISORDER NOT ELSEWHERE CLASSIFIED  . HYPERTENSION  . CVA  . ASTHMA  . HEMATURIA, CHRONIC  . DEGENERATIVE JOINT DISEASE, BACK  . BUNION  . HEADACHE, CHRONIC  . TAH/BSO, HX OF  . Gallstones  . Primary osteoarthritis of both knees   who p/w f/u of con't b/l knee pain.  Pt describes bad pain in the b/l knees, L worse than R.  She was dx'ed with b/l OA via xray back in Nov 2011.  Pt was instructed to take aleve and tylenol 3 as needed.  Pt says she is taking aleve, which provides some relief when she takes it.  Pt also c/o a lump in the back of her L knee and swelling in the L knee that is consistent.  She denies trauma, redness, or any systemic symptoms.  Review of Systems n/c    Objective:   Physical Exam Gen: NAD, cooperative, pleasant R knee: no edema/erythema, good ROM, mild TTP over b/l joint spaces L knee: edematous, non erythematous, palpable fluctuant mass on posterior knee. +TTP diffusely     Assessment:         Plan:

## 2011-07-02 NOTE — Assessment & Plan Note (Signed)
Patient's blood pressure today elevated to 167/86, but the patient says that she had run out of amlodipine and I filled it for a few weeks. I refilled it today and encouraged her to fill it as soon as possible.

## 2011-07-02 NOTE — Progress Notes (Signed)
This is the patient's choice. It would be better if he came in sooner.

## 2011-07-02 NOTE — Progress Notes (Signed)
Pt called back and given appointment for today.  She will come in at 2:30

## 2011-07-02 NOTE — Progress Notes (Signed)
Pt called with c/o left side pain and left arm pain.  Onset 1 week ago. She has tried aleve with relief . Also bil leg pain , on and off.  Gets relief with aleve.  Onset pain 3 weeks ago. Pt has cough, productive.  Denies fever. Chest hurts with cough. Denies SOB Hx: arthritis.  Pt offered appointment this week but is not able to come in until Monday. Pt asked to call back if any changes in Sx or SOB,nausea or diaphoresis.  Will this be okay?

## 2011-07-02 NOTE — Assessment & Plan Note (Addendum)
Patient presents for worsening knee pain, particularly on the left. Physical exam reveals roughly golf ball sized mass in the posterior knee near the superior calf, consistent with possible Baker's cyst. There is no redness or erythema, I do not think her knee is septic. Similarly, this has been going on for quite some time, I do not think she has DVT.   There is also diffuse edema both medially and laterally, and tenderness to palpation. The patient does have some difficulty with ambulation because of pain. During her last visit in November, she was given Tylenol 3, which he never filled. She's been taking Aleve with some relief. X-ray of the bilateral knees shows tricompartmental arthritis bilaterally. I think she would be a good candidate for physical therapy and continued conservative management. This can be done through our office, but given the possibility of a Baker's cyst, I would feel more comfortable with referral to sports medicine. Also I feel like the patient to benefit from corticosteroid injections, in which case sports medicine would also be beneficial. I counseled the patient on Tylenol #3, Aleve, and the importance of sports medicine followup. - Aleve - Tylenol 3 - Sports medicine referral - Followup with [place] in 2 months, after sports medicine visit

## 2011-07-04 NOTE — Telephone Encounter (Signed)
Done in other note 

## 2011-07-07 ENCOUNTER — Ambulatory Visit: Payer: Medicare Other | Admitting: Internal Medicine

## 2011-07-09 ENCOUNTER — Ambulatory Visit: Payer: Medicare Other | Admitting: Family Medicine

## 2011-07-11 NOTE — Progress Notes (Signed)
Addended by: Neomia Dear on: 07/11/2011 01:48 PM   Modules accepted: Orders

## 2011-08-06 ENCOUNTER — Encounter: Payer: Medicare Other | Admitting: Internal Medicine

## 2011-09-17 ENCOUNTER — Ambulatory Visit (INDEPENDENT_AMBULATORY_CARE_PROVIDER_SITE_OTHER): Payer: Medicare Other | Admitting: Internal Medicine

## 2011-09-17 ENCOUNTER — Encounter: Payer: Self-pay | Admitting: Internal Medicine

## 2011-09-17 VITALS — BP 152/89 | HR 65 | Temp 97.0°F | Ht 64.0 in | Wt 163.8 lb

## 2011-09-17 DIAGNOSIS — I10 Essential (primary) hypertension: Secondary | ICD-10-CM

## 2011-09-17 DIAGNOSIS — M25569 Pain in unspecified knee: Secondary | ICD-10-CM

## 2011-09-17 DIAGNOSIS — M170 Bilateral primary osteoarthritis of knee: Secondary | ICD-10-CM

## 2011-09-17 DIAGNOSIS — M1710 Unilateral primary osteoarthritis, unspecified knee: Secondary | ICD-10-CM

## 2011-09-17 DIAGNOSIS — M25562 Pain in left knee: Secondary | ICD-10-CM

## 2011-09-17 MED ORDER — AMLODIPINE BESYLATE 10 MG PO TABS: 10.0000 mg | ORAL_TABLET | Freq: Every day | ORAL | Status: DC

## 2011-09-17 MED ORDER — HEATING PAD PADS: 1.0000 | MEDICATED_PAD | Freq: Four times a day (QID) | Status: DC | PRN

## 2011-09-17 MED ORDER — HYDROXYZINE PAMOATE 25 MG PO CAPS: 25.0000 mg | ORAL_CAPSULE | Freq: Every evening | ORAL | Status: DC | PRN

## 2011-09-17 NOTE — Progress Notes (Signed)
  Subjective:    Patient ID: Chipper Oman, female    DOB: 08-Jan-1946, 66 y.o.   MRN: [id number]  HPI Ms. Skilton is a pleasant 66 year woman with past history of HTN, bilateral knee osteoarthritis, asthma who comes to the clinic for regular followup visit. Her blood pressure is 158/89.  She was seen by Dr. Berlinda Last during last visit for her knee swelling and pain for which she was referred to sports medicine clinic- which fall through crack somehow and she did not go to the appointment- says she's not aware of other appointments than our clinic.  She reports decreasing swelling of her left knee compared to last visit- but still posterior left knee is swollen. Has less pain- more mobility than before.  Denies any fever, chills, nausea, vomiting, abdominal pain, chest pain, shortness of breath, diarrhea.     Review of Systems    as per history of present illness, all other systems reviewed and negative. Objective:   Physical Exam  General: NAD HEENT: PERRL, EOMI, no scleral icterus Cardiac: RRR, no rubs, murmurs or gallops Pulm: clear to auscultation bilaterally, moving normal volumes of air Abd: soft, nontender, nondistended, BS present Ext: Left knee swollen posteriorly- popliteal fossa. No redness or tenderness.  Neuro: alert and oriented X3, cranial nerves II-XII grossly intact       Assessment & Plan:

## 2011-09-17 NOTE — Assessment & Plan Note (Signed)
As discussed in previous note from Dr. Berlinda Last- she might benefit from physical therapy/steroid injections via sports medicine clinic.  Pain and swelling better control than last visit in January 2013. Will rerefer her to sports medicine clinic today again.  Discussed with her in detail about importance of this visit- she verbalized understanding and says that will keep an appointment.

## 2011-09-17 NOTE — Assessment & Plan Note (Signed)
Lab Results  Component Value Date   NA 142 04/01/2011   K 3.5 04/01/2011   CL 101 04/01/2011   CO2 32 04/01/2011   BUN 19 04/01/2011   CREATININE 1.07 04/01/2011    BP Readings from Last 3 Encounters:  09/17/11 152/89  07/02/11 167/86  05/14/11 120/77    Assessment: Hypertension control:  mildly elevated  Progress toward goals:  improved Barriers to meeting goals:  no barriers identified  Plan: Hypertension treatment:  Increase the dose of Norvasc to 10 mg daily. If her blood pressure continues to be up on this medications- we'll consider increasing dose of lisinopril to 40 mg daily.

## 2011-09-17 NOTE — Patient Instructions (Addendum)
Followup with sports medicine clinic for your knee joint pain. Follow the recommendations.  make a followup appointment in 4-6 months.  Take Tylenol#3 for pain as needed.  Increase the dose of amlodipine to 10 mg daily.

## 2011-09-24 ENCOUNTER — Ambulatory Visit: Payer: Medicare Other | Admitting: Family Medicine

## 2011-10-01 ENCOUNTER — Ambulatory Visit: Payer: Medicare Other | Admitting: Family Medicine

## 2011-10-02 ENCOUNTER — Telehealth: Payer: Self-pay | Admitting: *Deleted

## 2011-10-02 NOTE — Telephone Encounter (Signed)
Received prior authorization request from pt's pharmacy ([facility] [id number]).  Pt's insurance will not pay for the hydroxyzine 25 as it is a "high risk medication for patients over 64". MD will need to complete form which was placed in his box for completion.  Insurance will only give our office 72 hours to complete and will automatically be denied if information is not received.  Will forward message to MD to complete form in a timely fashion.Criss Alvine, Deleon Passe Cassady4/11/201311:01 AM

## 2011-10-10 ENCOUNTER — Telehealth: Payer: Self-pay | Admitting: *Deleted

## 2011-10-10 NOTE — Telephone Encounter (Signed)
Thanks

## 2011-10-10 NOTE — Telephone Encounter (Signed)
Call to [employer] to verify dates of approval on Hydroxyzine 25 mg tablets- starting 10/02/2011 thru 06/22/2012.  Angelina Ok, RN 10/10/2011 11:47 AM.

## 2011-10-10 NOTE — Telephone Encounter (Signed)
Prior Approval for Hydroxyzine 25 mg capsules effective 10/02/2011 thru 06/22/2012.  Angelina Ok, RN 10/10/2011 11:44 AM.

## 2011-10-15 ENCOUNTER — Ambulatory Visit (INDEPENDENT_AMBULATORY_CARE_PROVIDER_SITE_OTHER): Payer: Medicare Other | Admitting: Family Medicine

## 2011-10-15 VITALS — BP 110/60

## 2011-10-15 DIAGNOSIS — M1710 Unilateral primary osteoarthritis, unspecified knee: Secondary | ICD-10-CM

## 2011-10-15 DIAGNOSIS — M170 Bilateral primary osteoarthritis of knee: Secondary | ICD-10-CM

## 2011-10-16 MED ORDER — METHYLPREDNISOLONE ACETATE 40 MG/ML IJ SUSP: 80.0000 mg | Freq: Once | INTRAMUSCULAR | Status: DC

## 2011-10-16 NOTE — Patient Instructions (Signed)
Patient was given written instructions on calling the clinic for her telephone follow up.

## 2011-10-16 NOTE — Progress Notes (Signed)
  Subjective:    Patient ID: Deborah [name], female    DOB: Jun 01, 1946, 66 y.o.   MRN: [id number]  HPI 66 y/o female with known knee osteoarthritis is here c/o an increase in her knee pain, especially the right side recently.  The pain is worse with with activity.  Aleve does help somewhat.   Review of Systems     Objective:   Physical Exam  Bilateral knees with bony changes of osteoarthritis Bilateral crepitus ROM is preserved The right knee is tender to palpation along the medial joint line There is no effusion  Plain films from Nov 2012 show bilateral tricompartmental degenerative changes, right greater than left (non-weight bearing)  Consent obtained and verified. Cleansed with alcohol. Topical analgesic spray: Ethyl chloride. Joint: right knee Approached in typical fashion with: from the anteriomedial aspect Completed without difficulty Meds: 80 mg depo medrol; 6 ml of 1% lidocaine without epinephrine Needle:25 g Aftercare instructions and Red flags advised.      Assessment & Plan:

## 2011-10-16 NOTE — Assessment & Plan Note (Signed)
Steroid injection of the right knee today.  Discussed with patient that are arthritis is significant and she may need a TKR at some point.  If she gets relief with the injection then her follow up will be PRN.  If the injection does not improve her pain we will refer her to ortho for further evaluation and likely a series of supartz.  She will call the clinic in 1-2 weeks to report on her symptoms.

## 2011-10-17 ENCOUNTER — Encounter: Payer: Medicare Other | Admitting: Internal Medicine

## 2011-10-20 ENCOUNTER — Other Ambulatory Visit: Payer: Self-pay | Admitting: Internal Medicine

## 2011-10-29 ENCOUNTER — Encounter: Payer: Self-pay | Admitting: Internal Medicine

## 2011-10-29 ENCOUNTER — Ambulatory Visit (INDEPENDENT_AMBULATORY_CARE_PROVIDER_SITE_OTHER): Payer: Medicare Other | Admitting: Internal Medicine

## 2011-10-29 VITALS — BP 114/69 | HR 99 | Temp 97.7°F | Ht 64.0 in | Wt 167.3 lb

## 2011-10-29 DIAGNOSIS — M170 Bilateral primary osteoarthritis of knee: Secondary | ICD-10-CM

## 2011-10-29 DIAGNOSIS — M1990 Unspecified osteoarthritis, unspecified site: Secondary | ICD-10-CM

## 2011-10-29 DIAGNOSIS — I10 Essential (primary) hypertension: Secondary | ICD-10-CM

## 2011-10-29 DIAGNOSIS — Z0000 Encounter for general adult medical examination without abnormal findings: Secondary | ICD-10-CM

## 2011-10-29 DIAGNOSIS — M1710 Unilateral primary osteoarthritis, unspecified knee: Secondary | ICD-10-CM

## 2011-10-29 MED ORDER — TRAMADOL HCL 50 MG PO TABS: 50.0000 mg | ORAL_TABLET | Freq: Two times a day (BID) | ORAL | Status: DC | PRN

## 2011-10-29 NOTE — Assessment & Plan Note (Signed)
Chronic bilateral knee arthritis. Status post right the steroid injection on October 16 2011, at sports medicine clinic. Patient felt better for a while and now has right knee pain. I will give her tramadol for short course on top of Tylenol #3 and reevaluate her. If her pain is in a better she will need to go back to sports medicine clinic.

## 2011-10-29 NOTE — Progress Notes (Signed)
  Subjective:    Patient ID: Deborah [name], female    DOB: 31-Aug-1945, 66 y.o.   MRN: [id number]  HPI patient is a pleasant 66 year old woman with past history of hypertension, CVA, arthritis of back and bilateral knees who comes to clinic for followup visit. She was referred to sports medicine clinic last visit in March- due to severe left knee pain. She has arthritis in bilateral knees.  She had one steroid injection in right knee which made her pain much better for a while but now she started having right knee pain again. Has no swelling, redness or significant tenderness. Pain gets worse with walking and standing. Is similar to the pain she had the left knee before. Tylenol #3 does not help her pain.  She has no fever, chills, nausea vomiting, abdominal pain, chest pain, short of breath.  She does not remember if she has a colonoscopy done before. Also her last mammogram was long time before. She has had hysterectomy in 1990 for fibroids.    Review of Systems    as per history of present illness, all other systems reviewed and negative. Objective:   Physical Exam  Constitutional: Vital signs reviewed.  Patient is a well-developed and well-nourished in no acute distress and cooperative with exam. Alert and oriented x3.  Head: Normocephalic and atraumatic Mouth: no erythema or exudates, MMM Eyes: PERRL, EOMI, conjunctivae normal, No scleral icterus.  Neck: Supple, Trachea midline normal ROM, No JVD, mass, thyromegaly, or carotid bruit present.  Cardiovascular: RRR, S1 normal, S2 normal, no MRG, pulses symmetric and intact bilaterally Pulmonary/Chest: CTAB, no wheezes, rales, or rhonchi Abdominal: Soft. Non-tender, non-distended, bowel sounds are normal, no masses, organomegaly, or guarding present.  Musculoskeletal: Bilateral knee arthritis changes. No erythema, or stiffness. Minimal tenderness to palpation of right knee on lateral side , no redness or swelling. Worsening of pain with  range of motion. No left knee pain.  Neurological: A&O x3, Strenght is normal and symmetric bilaterally, cranial nerve II-XII are grossly intact, no focal motor deficit, sensory intact to light touch bilaterally.  Skin: Warm, dry and intact. No rash, cyanosis, or clubbing.          Assessment & Plan:

## 2011-10-29 NOTE — Patient Instructions (Signed)
Please make a followup appointment in 3-4 months. Earlier if the pain does not get better. Also you can go to sports medicine clinic if the knee pain worsens.  Start using tramadol 50 mg one tablet 2 times a day as needed for knee pain.  Please followup with gastroenterologist for colonoscopy for colon cancer screening. Also get the mammogram done.   Keep taking all your medications regularly.

## 2011-10-29 NOTE — Assessment & Plan Note (Signed)
Will get colonoscopy referral and mammogram scheduled today. She got tetanus booster today. She had hysterectomy 1990 for fibroids and so probably doesn't need cervical cancer screening with Pap smear.

## 2011-10-29 NOTE — Assessment & Plan Note (Signed)
Lab Results  Component Value Date   NA 142 04/01/2011   K 3.5 04/01/2011   CL 101 04/01/2011   CO2 32 04/01/2011   BUN 19 04/01/2011   CREATININE 1.07 04/01/2011    BP Readings from Last 3 Encounters:  10/29/11 114/69  10/15/11 110/60  09/17/11 152/89    Assessment: Hypertension control:  controlled  Progress toward goals:  at goal Barriers to meeting goals:  no barriers identified  Plan: Hypertension treatment:  continue current medications

## 2011-12-12 ENCOUNTER — Other Ambulatory Visit: Payer: Self-pay | Admitting: Internal Medicine

## 2011-12-15 ENCOUNTER — Other Ambulatory Visit: Payer: Self-pay | Admitting: Internal Medicine

## 2012-01-13 ENCOUNTER — Other Ambulatory Visit: Payer: Self-pay | Admitting: Internal Medicine

## 2012-01-21 ENCOUNTER — Other Ambulatory Visit: Payer: Self-pay | Admitting: Internal Medicine

## 2012-02-04 ENCOUNTER — Encounter: Payer: Medicare Other | Admitting: Internal Medicine

## 2012-02-25 ENCOUNTER — Other Ambulatory Visit: Payer: Self-pay | Admitting: *Deleted

## 2012-02-25 DIAGNOSIS — I10 Essential (primary) hypertension: Secondary | ICD-10-CM

## 2012-02-25 MED ORDER — LISINOPRIL-HYDROCHLOROTHIAZIDE 20-25 MG PO TABS: 1.0000 | ORAL_TABLET | Freq: Every day | ORAL | Status: DC

## 2012-02-26 ENCOUNTER — Other Ambulatory Visit: Payer: Self-pay | Admitting: *Deleted

## 2012-02-26 MED ORDER — FLUOXETINE HCL 20 MG PO CAPS: 20.0000 mg | ORAL_CAPSULE | Freq: Every day | ORAL | Status: DC

## 2012-03-02 ENCOUNTER — Other Ambulatory Visit: Payer: Self-pay | Admitting: Internal Medicine

## 2012-03-24 ENCOUNTER — Encounter: Payer: Medicare Other | Admitting: Internal Medicine

## 2012-04-03 ENCOUNTER — Other Ambulatory Visit: Payer: Self-pay | Admitting: Internal Medicine

## 2012-04-07 ENCOUNTER — Encounter: Payer: Medicare Other | Admitting: Internal Medicine

## 2012-04-14 ENCOUNTER — Encounter: Payer: Medicare Other | Admitting: Internal Medicine

## 2012-04-22 ENCOUNTER — Telehealth: Payer: Self-pay | Admitting: *Deleted

## 2012-04-22 NOTE — Telephone Encounter (Signed)
Pt called and said she needed refill on pain med. Pt got 04/05/12 Tramadol #60 x 2 refills at [facility]. Message left on ID phone recording to call [facility] if that was not the correct answer. Stanton Kidney Kepler Mccabe RN 04/22/12 2:50PM

## 2012-05-03 ENCOUNTER — Encounter: Payer: Self-pay | Admitting: Internal Medicine

## 2012-05-03 ENCOUNTER — Ambulatory Visit (INDEPENDENT_AMBULATORY_CARE_PROVIDER_SITE_OTHER): Payer: Medicare Other | Admitting: Internal Medicine

## 2012-05-03 VITALS — BP 108/75 | HR 68 | Temp 97.9°F | Ht 64.0 in | Wt 158.9 lb

## 2012-05-03 DIAGNOSIS — Z0000 Encounter for general adult medical examination without abnormal findings: Secondary | ICD-10-CM

## 2012-05-03 DIAGNOSIS — M25571 Pain in right ankle and joints of right foot: Secondary | ICD-10-CM | POA: Insufficient documentation

## 2012-05-03 DIAGNOSIS — M25579 Pain in unspecified ankle and joints of unspecified foot: Secondary | ICD-10-CM

## 2012-05-03 DIAGNOSIS — M1710 Unilateral primary osteoarthritis, unspecified knee: Secondary | ICD-10-CM

## 2012-05-03 DIAGNOSIS — M170 Bilateral primary osteoarthritis of knee: Secondary | ICD-10-CM

## 2012-05-03 DIAGNOSIS — Z23 Encounter for immunization: Secondary | ICD-10-CM

## 2012-05-03 DIAGNOSIS — I10 Essential (primary) hypertension: Secondary | ICD-10-CM

## 2012-05-03 NOTE — Assessment & Plan Note (Signed)
Patient with possible ankle sprain versus tendon damage. Recovering slowly. Will sign the orders for ankle brace which he asked for today.

## 2012-05-03 NOTE — Patient Instructions (Signed)
Please make a follow appointment in 6-8 weeks. Followup with sports medicine clinic, in gastroenterology clinic for colonoscopy and get the mammogram done. We will fax the orders for knee and ankle brace to the company which you asked for. Continue taking all medications regularly.

## 2012-05-03 NOTE — Assessment & Plan Note (Signed)
Lab Results  Component Value Date   NA 142 04/01/2011   K 3.5 04/01/2011   CL 101 04/01/2011   CO2 32 04/01/2011   BUN 19 04/01/2011   CREATININE 1.07 04/01/2011    BP Readings from Last 3 Encounters:  05/03/12 108/75  10/29/11 114/69  10/15/11 110/60    Assessment: Hypertension control:  controlled  Progress toward goals:  at goal Barriers to meeting goals:  no barriers identified  Plan: Hypertension treatment:  continue current medications. Continue Norvasc and lisinopril/HCTZ.

## 2012-05-03 NOTE — Assessment & Plan Note (Signed)
Scheduled for mammogram and colonoscopy. Received flu shot today. No need for Pap smear as patient has TAH/BSO in 1990.

## 2012-05-03 NOTE — Progress Notes (Signed)
  Subjective:    Patient ID: Deborah [name], female    DOB: Nov 12, 1945, 66 y.o.   MRN: [id number]  HPIPatient is a pleasant 66 year woman with past history of hypertension, CVA, depression, knee osteoarthritis who comes the clinic for followup visit. She does complain of bilateral knee pain more in right and left. She reports improvement with steroid injection earlier this year at sports was in clinic and would like to go back to try again. She is also open for knee replacement surgery if needed. She also reports of right ankle pain and swelling which started about 2 months before.  She wants in order for knee brace and ankle brace to be sent to a company which he has papers from. I will sign the orders and fax it to the company. She is taking all her medications regularly. She did not have her mammogram or colonoscopy done yet. Will schedule for both.  She denies any fever, chills, nausea, vomiting, abdominal pain, chest pain, short of breath, diarrhea.  Review of Systems    as per history of present illness, all other systems reviewed and negative. Objective:   Physical Exam  Constitutional: Vital signs reviewed.  Patient is a well-developed and well-nourished in no acute distress and cooperative with exam. Alert and oriented x3.  Head: Normocephalic and atraumatic Mouth: no erythema or exudates, MMM Eyes: PERRL, EOMI, conjunctivae normal, No scleral icterus.  Neck: Supple, Trachea midline normal ROM, No JVD, mass, thyromegaly, or carotid bruit present.  Cardiovascular: RRR, S1 normal, S2 normal, no MRG, pulses symmetric and intact bilaterally Pulmonary/Chest: CTAB, no wheezes, rales, or rhonchi Abdominal: Soft. Non-tender, non-distended, bowel sounds are normal, no masses, organomegaly, or guarding present.  Musculoskeletal: right knee chronic degenerative changes. Mild swelling without any significant tenderness or redness. Osteoarthritic changes of the wrist noted. Right ankle mild  swelling without any redness or significant tenderness to palpation or movements. Neurological: A&O x3, Strength is normal and symmetric bilaterally, cranial nerve II-XII are grossly intact, no focal motor deficit, sensory intact to light touch bilaterally.  Skin: Warm, dry and intact. No rash, cyanosis, or clubbing.  Psychiatric: Normal mood and affect. speech and behavior is normal.         Assessment & Plan:

## 2012-05-03 NOTE — Assessment & Plan Note (Signed)
Chronic osteoarthritic changes noted in bilateral knees. Patient verbalized wish to proceed with surgery. Pain control with tramadol. Patient reported improvement with steroid injections earlier this year at sports medicine clinic. She would like to go back and try injections again before proceeding for surgical evaluation. Will make referral to sports medicine clinic again.

## 2012-05-12 ENCOUNTER — Ambulatory Visit: Payer: Medicare Other | Admitting: Family Medicine

## 2012-05-19 ENCOUNTER — Ambulatory Visit (INDEPENDENT_AMBULATORY_CARE_PROVIDER_SITE_OTHER): Payer: Medicare Other | Admitting: Family Medicine

## 2012-05-19 VITALS — BP 120/60 | Ht 64.0 in | Wt 158.9 lb

## 2012-05-19 DIAGNOSIS — M170 Bilateral primary osteoarthritis of knee: Secondary | ICD-10-CM

## 2012-05-19 DIAGNOSIS — M1710 Unilateral primary osteoarthritis, unspecified knee: Secondary | ICD-10-CM

## 2012-05-19 NOTE — Progress Notes (Signed)
Deborah [name] is a 66 y.o. female who presents to [facility] today for bilateral knee pain.  Patient has a long history of bilateral knee pain found to be secondary to osteoarthritis with x-rays about 2 years ago.  She has had one corticosteroid injection to her left knee, which worked well.  She notes chronic moderate aching pain in both knees.  The pain is mostly anterior and worse with activity and better with rest.  She notes swelling associated with pain but denies any locking catching or giving way.  She denies any significant radiating pain weakness or numbness.  She is taking some over-the-counter pain medications which are marginally helpful.     PMH reviewed. Negative for diabetes History  Substance Use Topics  . Smoking status: Former Smoker -- 0.1 packs/day    Types: Cigarettes    Quit date: 02/25/2011  . Smokeless tobacco: Current User  . Alcohol Use: No   ROS as above otherwise neg   Exam:  BP 120/60  Ht 5\' 4"  (1.626 m)  Wt 158 lb 14.4 oz (72.077 kg)  BMI 27.28 kg/m2 Gen: Well NAD MSK: Bilateral knees:  Mild effusion is present 1+ patellar crepitations on extension Range of motion 0-120 Nontender over joint lines Negative Lachman's McMurray's valgus varus stress  Distal neurovascular exam is normal with normal sensation pulses and capillary refill distally bilateral lower extremities. No significant edema bilateral lower extremities.    Right Knee injection. Consent obtained and timeout performed. Patient seated in a comfortable position with legs hanging off the table.  The medial Peri-patellar tendon space was palpated and marked. The skin was then cleaned with alcohol. Cold spray applied. A 25-gauge inch and a half needle was used to inject 40 mg of Depo-Medrol and 3 mL of 0.5% Marcaine. Patient tolerated procedure well no bleeding. Pain improved following injection  Left Knee injection. Consent obtained and timeout performed. Patient seated in a comfortable  position with legs hanging off the table.  The medial Peri-patellar tendon space was palpated and marked. The skin was then cleaned with alcohol. Cold spray applied. A 25-gauge inch and a half needle was used to inject 40 mg of Depo-Medrol and 3 mL of 0.5% Marcaine. Patient tolerated procedure well no bleeding. Pain improved following injection

## 2012-05-19 NOTE — Assessment & Plan Note (Signed)
This is resulting in bilateral knee pain. \ Plan today treat with corticosteroid injections bilaterally.  Will followup as needed.  Discussed warning signs or symptoms. Please see discharge instructions. Patient expresses understanding.

## 2012-05-19 NOTE — Patient Instructions (Addendum)
Thank you for coming in today. Call or go to the ER if you develop a large red swollen joint with extreme pain or oozing puss.  Come back as needed.

## 2012-06-09 ENCOUNTER — Ambulatory Visit
Admission: RE | Admit: 2012-06-09 | Discharge: 2012-06-09 | Disposition: A | Discharge status: Home / Self Care | Payer: Medicare Other | Source: Ambulatory Visit | Attending: Internal Medicine | Admitting: Internal Medicine

## 2012-06-09 DIAGNOSIS — Z0000 Encounter for general adult medical examination without abnormal findings: Secondary | ICD-10-CM

## 2012-09-08 ENCOUNTER — Encounter: Payer: Medicare Other | Admitting: Internal Medicine

## 2012-09-13 ENCOUNTER — Other Ambulatory Visit: Payer: Self-pay | Admitting: *Deleted

## 2012-09-13 DIAGNOSIS — I10 Essential (primary) hypertension: Secondary | ICD-10-CM

## 2012-09-13 DIAGNOSIS — M25561 Pain in right knee: Secondary | ICD-10-CM

## 2012-09-15 MED ORDER — LISINOPRIL-HYDROCHLOROTHIAZIDE 20-25 MG PO TABS: 1.0000 | ORAL_TABLET | Freq: Every day | ORAL | Status: DC

## 2012-09-15 MED ORDER — FLUOXETINE HCL 20 MG PO CAPS: 20.0000 mg | ORAL_CAPSULE | Freq: Every day | ORAL | Status: DC

## 2012-09-15 MED ORDER — TRAMADOL HCL 50 MG PO TABS: ORAL_TABLET | ORAL | Status: DC

## 2012-09-15 MED ORDER — HYDROXYZINE PAMOATE 25 MG PO CAPS: 25.0000 mg | ORAL_CAPSULE | Freq: Every evening | ORAL | Status: DC | PRN

## 2012-09-15 MED ORDER — AMLODIPINE BESYLATE 10 MG PO TABS: 10.0000 mg | ORAL_TABLET | Freq: Every day | ORAL | Status: DC

## 2012-11-03 ENCOUNTER — Ambulatory Visit (INDEPENDENT_AMBULATORY_CARE_PROVIDER_SITE_OTHER): Payer: Medicare Other | Admitting: Internal Medicine

## 2012-11-03 ENCOUNTER — Encounter: Payer: Self-pay | Admitting: Internal Medicine

## 2012-11-03 VITALS — BP 130/85 | HR 77 | Temp 97.3°F | Ht 63.75 in | Wt 158.7 lb

## 2012-11-03 DIAGNOSIS — Z0000 Encounter for general adult medical examination without abnormal findings: Secondary | ICD-10-CM

## 2012-11-03 DIAGNOSIS — I10 Essential (primary) hypertension: Secondary | ICD-10-CM

## 2012-11-03 DIAGNOSIS — Z13820 Encounter for screening for osteoporosis: Secondary | ICD-10-CM

## 2012-11-03 DIAGNOSIS — M170 Bilateral primary osteoarthritis of knee: Secondary | ICD-10-CM

## 2012-11-03 DIAGNOSIS — M1710 Unilateral primary osteoarthritis, unspecified knee: Secondary | ICD-10-CM

## 2012-11-03 NOTE — Assessment & Plan Note (Addendum)
We'll get a DEXA scan. - We'll figure out why she did not have colonoscopy done - as it has been tried on multiple times in past. - Patient says she does not have colon cancer- which I discussed with her about screening for it- which might be just once for if it's normal because she 46. -She says make an appointment for her  for colonoscopy.

## 2012-11-03 NOTE — Patient Instructions (Addendum)
Please followup in 6-8 months or earlier if needed.  Call us if your pain in knees gets worse.  Get the DEXA scan done for your bones. Also will try to get appointment for colonoscopy.  Keep taking all medications regularly as you do.

## 2012-11-03 NOTE — Progress Notes (Signed)
  Subjective:    Patient ID: Deborah [name], female    DOB: 04/18/1946, 67 y.o.   MRN: [id number]  HPI patient is a pleasant 67 year woman with osteoarthritis, hypertension, CVA, depression other problems as per problem list who comes the clinic for regular followup visit.  She denies any new symptoms. She does reports intermittent left knee pain and feeling a knot behind the left knee, but she is ambulatory and is actually starting to ride bicycle for exercise. She takes tramadol for pain.  She denies any fever, chills, nausea, vomiting, abdominal pain, chest pain, short of breath, diarrhea, headache, palpitations.  She did not have a colonoscopy done. Appointments have been tried to be scheduled in past. We'll check that again today.    Review of Systems    as per history of present illness. Objective:   Physical Exam  General: NAD HEENT: PERRL, EOMI, no scleral icterus Cardiac: RRR, no rubs, murmurs or gallops Pulm: clear to auscultation bilaterally, moving normal volumes of air Abd: soft, nontender, nondistended, BS present Ext: warm and well perfused, no pedal edema. Bilateral knees osteoarthritic changes noted. Mild swelling of left knee with no redness or tenderness.  Neuro: alert and oriented X3, cranial nerves II-XII grossly intact       Assessment & Plan:

## 2012-11-03 NOTE — Assessment & Plan Note (Signed)
BP Readings from Last 3 Encounters:  11/03/12 130/85  05/19/12 120/60  05/03/12 108/75    Lab Results  Component Value Date   NA 142 04/01/2011   K 3.5 04/01/2011   CREATININE 1.07 04/01/2011    Assessment: Blood pressure control:   at goal Progress toward BP goal:    stable Comments:   Plan: Medications:  continue current medications, Amlodipine, Prinzide Educational resources provided: brochure;handout;video Self management tools provided:   Other plans: No new changes in medications today.

## 2012-11-03 NOTE — Addendum Note (Signed)
Addended by: Lyn Hollingshead C on: 11/03/2012 02:40 PM   Modules accepted: Orders

## 2012-11-03 NOTE — Assessment & Plan Note (Signed)
Patient with chronic osteoarthritis of both knees.  Got steroid shots in November 2013 at [facility] medicine clinic with significant improvement. No new significant changes. Patient has excellent mobility and strength of her lower extremities. She walks everyday and now is starting to ride bicycle for exercise.  - Continue tramadol as needed for pain.

## 2012-11-04 NOTE — Progress Notes (Signed)
Message left on home ID phone recording - Bone density sch 11/11/12 arrive at 12:45PM [facility] appt 1PM. Stanton Kidney Santia Labate RN 11/04/12 1:30PM

## 2012-11-11 ENCOUNTER — Ambulatory Visit (HOSPITAL_COMMUNITY): Payer: Medicare Other | Attending: Internal Medicine

## 2012-11-17 ENCOUNTER — Telehealth: Payer: Self-pay | Admitting: *Deleted

## 2012-11-18 ENCOUNTER — Encounter: Payer: Self-pay | Admitting: Internal Medicine

## 2012-11-18 ENCOUNTER — Ambulatory Visit (INDEPENDENT_AMBULATORY_CARE_PROVIDER_SITE_OTHER): Payer: Medicare Other | Admitting: Internal Medicine

## 2012-11-18 VITALS — BP 131/80 | HR 69 | Temp 97.7°F | Ht 63.0 in | Wt 156.5 lb

## 2012-11-18 DIAGNOSIS — I10 Essential (primary) hypertension: Secondary | ICD-10-CM

## 2012-11-18 DIAGNOSIS — M542 Cervicalgia: Secondary | ICD-10-CM

## 2012-11-18 MED ORDER — CYCLOBENZAPRINE HCL 10 MG PO TABS: 10.0000 mg | ORAL_TABLET | Freq: Three times a day (TID) | ORAL | Status: DC | PRN

## 2012-11-18 NOTE — Assessment & Plan Note (Signed)
Most likely muscle spasm from sleeping injury. No trauma. No signs of cervical disc disease. No sensory or motor changes. Unclear if patient has torticollis.  - Start Flexeril 10 mg 3 times a day along with Advil 400 mg 3 times a day for symptomatic management. - if no improvement and or worsening of symptoms in 2-3 days, patient will give [place] a call for an early appointment.

## 2012-11-18 NOTE — Progress Notes (Signed)
  Subjective:    Patient ID: Deborah [name], female    DOB: 11/05/1945, 67 y.o.   MRN: [id number]  HPIpatient is a pleasant 67 year woman with hypertension, CVA, asthma, osteoarthritis and other problems as per problem list who comes the clinic for acute onset left sided neck pain for past 4 days.  She woke of in the morning severe neck pain left side. The pain radiates to the back on left side but not to the arm or shoulder. She denies any sensory changes including numbness, tingling or any weakness. Limited range of motion of neck due to pain. Cannot perform lateral extension/flexion, but can perform limited flexion and extension and rotation although with worsening pain. Denies any trauma. Never had this kind of pain before.  She denies any fever, chills, nausea vomiting, abdominal pain, chest pain, short of breath, headache or diarrhea, palpitations.    Review of Systems    as per history of present illness. Objective:   Physical Exam  General: Patient limiting range of motion of neck due to pain. HEENT: PERRL, EOMI, no scleral icterus Cardiac: S1, S2,RRR, no rubs, murmurs or gallops Pulm: clear to auscultation bilaterally, moving normal volumes of air Abd: soft, nontender, nondistended, BS present Ext: warm and well perfused, no pedal edema Musculoskeletal: tenderness to palpation on lateral neck muscle. Limited range of motion of neck due to pain. No swelling or skin changes noted. Neuro: alert and oriented X3, cranial nerves II-XII grossly intact       Assessment & Plan:

## 2012-11-18 NOTE — Patient Instructions (Signed)
Please make followup appointment in 3-4 months.  Call us earlier if your neck pain does not get better or gets worse.  Start taking  Flexeril 10 mg 3 times a day as needed for muscle spasm and pain.  Also start taking Advil 400 mg 3 times a day with meals for 7-10 days for pain.  Continue taking all the medications regularly.

## 2012-11-18 NOTE — Telephone Encounter (Signed)
review 

## 2012-11-18 NOTE — Assessment & Plan Note (Signed)
BP Readings from Last 3 Encounters:  11/18/12 131/80  11/03/12 130/85  05/19/12 120/60    Lab Results  Component Value Date   NA 142 04/01/2011   K 3.5 04/01/2011   CREATININE 1.07 04/01/2011    Assessment: Blood pressure control:  At goal Progress toward BP goal:   stable Comments:   Plan: Medications:  continue current medications, amlodipine,  Prinzide Educational resources provided: Comptroller tools provided:   Other plans: No changes in medications.

## 2012-11-18 NOTE — Progress Notes (Signed)
Case discussed with Dr. Ravi Patel  at the time of the visit, immediately after the resident saw the patient.  I reviewed the resident's history and exam and pertinent patient test results.  I agree with the assessment, diagnosis and plan of care documented in the resident's note.  

## 2012-12-08 ENCOUNTER — Ambulatory Visit (HOSPITAL_COMMUNITY): Admission: RE | Admit: 2012-12-08 | Payer: Medicare Other | Source: Ambulatory Visit

## 2012-12-31 ENCOUNTER — Ambulatory Visit (HOSPITAL_COMMUNITY): Payer: Medicare Other

## 2013-02-04 ENCOUNTER — Encounter: Payer: Medicare Other | Admitting: Internal Medicine

## 2013-02-04 ENCOUNTER — Encounter: Payer: Self-pay | Admitting: Internal Medicine

## 2013-05-27 ENCOUNTER — Encounter: Payer: Self-pay | Admitting: Internal Medicine

## 2013-05-27 ENCOUNTER — Ambulatory Visit (INDEPENDENT_AMBULATORY_CARE_PROVIDER_SITE_OTHER): Payer: Medicare Other | Admitting: Internal Medicine

## 2013-05-27 VITALS — BP 120/77 | HR 83 | Temp 97.7°F | Ht 63.0 in | Wt 171.2 lb

## 2013-05-27 DIAGNOSIS — I10 Essential (primary) hypertension: Secondary | ICD-10-CM

## 2013-05-27 DIAGNOSIS — Z0000 Encounter for general adult medical examination without abnormal findings: Secondary | ICD-10-CM

## 2013-05-27 DIAGNOSIS — M1710 Unilateral primary osteoarthritis, unspecified knee: Secondary | ICD-10-CM

## 2013-05-27 DIAGNOSIS — Z23 Encounter for immunization: Secondary | ICD-10-CM

## 2013-05-27 DIAGNOSIS — M1712 Unilateral primary osteoarthritis, left knee: Secondary | ICD-10-CM

## 2013-05-27 DIAGNOSIS — M170 Bilateral primary osteoarthritis of knee: Secondary | ICD-10-CM

## 2013-05-27 LAB — BASIC METABOLIC PANEL WITH GFR
BUN: 15 mg/dL (ref 6–23)
Chloride: 104 mEq/L (ref 96–112)
GFR, Est Non African American: 39 mL/min — ABNORMAL LOW
Potassium: 3.7 mEq/L (ref 3.5–5.3)
Sodium: 141 mEq/L (ref 135–145)

## 2013-05-27 MED ORDER — TRAMADOL HCL 50 MG PO TABS: ORAL_TABLET | ORAL | Status: DC

## 2013-05-27 NOTE — Assessment & Plan Note (Signed)
BP well-controlled at 120/77 today.  Patient has not been taking Norvasc 10 mg daily as she ran out of the medication several months ago.  Continue current regimen of lisinopril/HCTZ 20-25 mg daily.    BMP at next visit.

## 2013-05-27 NOTE — Assessment & Plan Note (Addendum)
Patient has chronic osteoarthritis of both knees, pain was greatly improved on tramadol 50 mg once daily but she ran out of this medication several months ago.  She has seen sports medicine and received steroid injections in the past but prefers to resume tramadol as this provides sufficient pain relief.  On exam, full strength and sensation, range of motion, no concern for infection.  Rx for tramadol 50 mg once daily #30, refills #5.

## 2013-05-27 NOTE — Patient Instructions (Signed)
Please follow-up with Dr. Aundria Rud in 3-4 months.    Please take your medicines as prescribed.  Don't forget your follow-up appointment at [facility] on 12/19.    And don't forget to send your stool cards back in!  Wear and Tear Disorders of the Knee (Arthritis, Osteoarthritis) Everyone will experience wear and tear injuries (arthritis, osteoarthritis) of the knee. These are the changes we all get as we age. They come from the joint stress of daily living. The amount of cartilage damage in your knee and your symptoms determine if you need surgery. Mild problems require approximately two months recovery time. More severe problems take several months to recover. With mild problems, your surgeon may find worn and rough cartilage surfaces. With severe changes, your surgeon may find cartilage that has completely worn away and exposed the bone. Loose bodies of bone and cartilage, bone spurs (excess bone growth), and injuries to the menisci (cushions between the large bones of your leg) are also common. All of these problems can cause pain. For a mild wear and tear problem, rough cartilage may simply need to be shaved and smoothed. For more severe problems with areas of exposed bone, your surgeon may use an instrument for roughing up the bone surfaces to stimulate new cartilage growth. Loose bodies are usually removed. Torn menisci may be trimmed or repaired. ABOUT THE ARTHROSCOPIC PROCEDURE Arthroscopy is a surgical technique. It allows your orthopedic surgeon to diagnose and treat your knee injury with accuracy. The surgeon looks into your knee through a small scope. The scope is like a small (pencil-sized) telescope. Arthroscopy is less invasive than open knee surgery. You can expect a more rapid recovery. After the procedure, you will be moved to a recovery area until most of the effects of the medication have worn off. Your caregiver will discuss the test results with you. RECOVERY The severity of the arthritis  and the type of procedure performed will determine recovery time. Other important factors include age, physical condition, medical conditions, and the type of rehabilitation program. Strengthening your muscles after arthroscopy helps guarantee a better recovery. Follow your caregiver's instructions. Use crutches, rest, elevate, ice, and do knee exercises as instructed. Your caregivers will help you and instruct you with exercises and other physical therapy required to regain your mobility, muscle strength, and functioning following surgery. Only take over-the-counter or prescription medicines for pain, discomfort, or fever as directed by your caregiver.  SEEK MEDICAL CARE IF:   There is increased bleeding (more than a small spot) from the wound.  You notice redness, swelling, or increasing pain in the wound.  Pus is coming from wound.  You develop an unexplained oral temperature above 102 F (38.9 C) , or as your caregiver suggests.  You notice a foul smell coming from the wound or dressing.  You have severe pain with motion of the knee. SEEK IMMEDIATE MEDICAL CARE IF:   You develop a rash.  You have difficulty breathing.  You have any allergic problems. MAKE SURE YOU:   Understand these instructions.  Will watch your condition.  Will get help right away if you are not doing well or get worse. Document Released: 06/06/2000 Document Revised: 09/01/2011 Document Reviewed: 11/03/2007 [facility] Patient Information 2014 [place], [place].

## 2013-05-27 NOTE — Progress Notes (Signed)
Patient ID: Deborah [name], female   DOB: [birth year], 67 y.o.   MRN: [id number]   Subjective:   Patient ID: Deborah [name] female   DOB: [birth year] 67 y.o.   MRN: [id number]  HPI: Deborah [name] is a 67 y.o. woman with history of HTN, CVA, asthma, bipolar disorder, osteoarthritis of both knees who presents with constant left knee pain with ambulation.   Patient states that she ran out of tramadol several months ago and since that time has been having knee pain with ambulation. She thinks her knees may have been swollen as well and has applied a cream she purchased at [employer] store with significant relief. She has been seen by sports medicine in the past for similar complaints and has received steroid injections to both knees. However, she felt her pain was well-controlled on tramadol 50 mg by mouth once daily and prefers to resume this medication. No redness or drainage from the affected areas, no fevers.    She reports good medication compliance although she is no longer taking Norvasc 10 mg daily, and her blood pressure is well-controlled today.  She follows with [facility] for bipolar disorder and reports good compliance with her Prozac and Seroquel; she has a followup appointment there on 06/10/13.   Neck pain from last visit has resolved.  Past Medical History  Diagnosis Date  . Hypertension   . Hematuria   . Trichomonal vaginitis   . Asthma   . Anxiety   . Headache, chronic daily   . CVA (cerebral vascular accident)   . S/P TAH-BSO   . Tobacco abuse   . Cocaine abuse   . Alcohol abuse, in remission   . Degenerative joint disease     back  . Fracture of hand   . Inadequate material resources   . Mental/behavioral problem    Current Outpatient Prescriptions  Medication Sig Dispense Refill  . aspirin 81 MG tablet Take 81 mg by mouth daily.        Marland Kitchen FLUoxetine (PROZAC) 20 MG capsule Take 1 capsule (20 mg total) by mouth daily.  30 capsule  5  . lisinopril-hydrochlorothiazide  (PRINZIDE,ZESTORETIC) 20-25 MG per tablet Take 1 tablet by mouth daily.  30 tablet  5  . SEROQUEL XR 50 MG TB24 Take 2 tablets by mouth at bedtime.       Marland Kitchen lisinopril-hydrochlorothiazide (PRINZIDE,ZESTORETIC) 20-25 MG per tablet Take 1 tablet by mouth daily.  31 tablet  2  . traMADol (ULTRAM) 50 MG tablet take 1 tablet by mouth twice a day if needed for pain  30 tablet  5   Current Facility-Administered Medications  Medication Dose Route Frequency Provider Last Rate Last Dose  . methylPREDNISolone acetate (DEPO-MEDROL) injection 80 mg  80 mg Intra-articular Once Sherrell Puller, MD       Family History  Problem Relation Age of Onset  . Cancer Father     colon cancer  . Cancer Paternal Aunt     colon cancer    History   Social History  . Marital Status: Divorced    Spouse Name: N/A    Number of Children: N/A  . Years of Education: N/A   Social History Main Topics  . Smoking status: Current Some Day Smoker -- 0.10 packs/day    Types: Cigarettes    Last Attempt to Quit: 02/25/2011  . Smokeless tobacco: Current User  . Alcohol Use: No  . Drug Use: No  . Sexual Activity: None   Other Topics Concern  .  None   Social History Narrative  . None   Review of Systems: Review of Systems  Constitutional: Negative for fever, chills and malaise/fatigue.  Eyes: Negative for blurred vision.  Respiratory: Positive for cough.   Gastrointestinal: Negative for nausea, vomiting, abdominal pain, diarrhea, constipation and blood in stool.  Genitourinary: Negative for dysuria and frequency.  Musculoskeletal: Positive for joint pain. Negative for back pain and falls.  Neurological: Negative for dizziness, tingling, loss of consciousness and headaches.    Objective:  Physical Exam: Filed Vitals:   05/27/13 1437  BP: 120/77  Pulse: 83  Temp: 97.7 F (36.5 C)  TempSrc: Oral  Height: 5\' 3"  (1.6 m)  Weight: 171 lb 3.2 oz (77.656 kg)  SpO2: 99%   General: alert, cooperative, and in no  apparent distress HEENT: pupils equal round and reactive to light, vision grossly intact, oropharynx clear and non-erythematous  Neck: supple, no lymphadenopathy Lungs: clear to ascultation bilaterally, normal work of respiration, no wheezes, rales, ronchi Heart: regular rate and rhythm, no murmurs, gallops, or rubs Abdomen: soft, non-tender, non-distended, normal bowel sounds Extremities: bilateral lower extremities with 5/5 strength throughout, good ROM, sensation intact, no erythema or edema; 2+ DP/PT pulses bilaterally, no cyanosis, clubbing, or edema Neurologic: alert & oriented X3, cranial nerves II-XII intact, strength grossly intact, sensation intact to light touch  Assessment & Plan:  Patient discussed with Dr. Josem Kaufmann.  Please see problem-based assessment and plan.

## 2013-05-27 NOTE — Assessment & Plan Note (Addendum)
Flu shot today.  Patient declines colonoscopy at this time. She was given FOBT cards to complete and mail in, stamp provided.  She does have a family history of colon cancer (mother and paternal aunt).  Patient did not attend scheduled DEXA scan and is not interested in rescheduling.

## 2013-05-28 NOTE — Progress Notes (Signed)
I saw and evaluated the patient.  I personally confirmed the key portions of Dr. Roger's history and exam and reviewed pertinent patient test results.  The assessment, diagnosis, and plan were formulated together and I agree with the documentation in the resident's note. 

## 2013-06-24 ENCOUNTER — Other Ambulatory Visit (INDEPENDENT_AMBULATORY_CARE_PROVIDER_SITE_OTHER): Payer: Medicare Other

## 2013-06-24 DIAGNOSIS — Z1211 Encounter for screening for malignant neoplasm of colon: Secondary | ICD-10-CM

## 2013-06-24 LAB — POC HEMOCCULT BLD/STL (HOME/3-CARD/SCREEN)
Card #2 Fecal Occult Blod, POC: NEGATIVE
FECAL OCCULT BLD: NEGATIVE
Fecal Occult Blood, POC: NEGATIVE

## 2013-07-12 ENCOUNTER — Other Ambulatory Visit: Payer: Self-pay | Admitting: *Deleted

## 2013-07-12 DIAGNOSIS — I10 Essential (primary) hypertension: Secondary | ICD-10-CM

## 2013-07-13 MED ORDER — LISINOPRIL-HYDROCHLOROTHIAZIDE 20-25 MG PO TABS: 1.0000 | ORAL_TABLET | Freq: Every day | ORAL | Status: DC

## 2013-08-18 ENCOUNTER — Ambulatory Visit: Payer: Medicare Other | Admitting: Internal Medicine

## 2013-09-08 ENCOUNTER — Encounter: Payer: Self-pay | Admitting: Internal Medicine

## 2013-09-08 ENCOUNTER — Ambulatory Visit (INDEPENDENT_AMBULATORY_CARE_PROVIDER_SITE_OTHER): Payer: Medicare Other | Admitting: Internal Medicine

## 2013-09-08 VITALS — BP 132/85 | HR 73 | Temp 98.9°F | Ht 64.0 in | Wt 178.3 lb

## 2013-09-08 DIAGNOSIS — M170 Bilateral primary osteoarthritis of knee: Secondary | ICD-10-CM

## 2013-09-08 DIAGNOSIS — I6350 Cerebral infarction due to unspecified occlusion or stenosis of unspecified cerebral artery: Secondary | ICD-10-CM

## 2013-09-08 DIAGNOSIS — I10 Essential (primary) hypertension: Secondary | ICD-10-CM

## 2013-09-08 DIAGNOSIS — M1710 Unilateral primary osteoarthritis, unspecified knee: Secondary | ICD-10-CM

## 2013-09-08 DIAGNOSIS — F3289 Other specified depressive episodes: Secondary | ICD-10-CM

## 2013-09-08 DIAGNOSIS — F329 Major depressive disorder, single episode, unspecified: Secondary | ICD-10-CM

## 2013-09-08 DIAGNOSIS — M1712 Unilateral primary osteoarthritis, left knee: Secondary | ICD-10-CM

## 2013-09-08 DIAGNOSIS — IMO0002 Reserved for concepts with insufficient information to code with codable children: Secondary | ICD-10-CM

## 2013-09-08 DIAGNOSIS — Z0000 Encounter for general adult medical examination without abnormal findings: Secondary | ICD-10-CM

## 2013-09-08 DIAGNOSIS — Z8673 Personal history of transient ischemic attack (TIA), and cerebral infarction without residual deficits: Secondary | ICD-10-CM

## 2013-09-08 MED ORDER — TRAMADOL HCL 50 MG PO TABS: ORAL_TABLET | ORAL | Status: DC

## 2013-09-08 NOTE — Assessment & Plan Note (Signed)
Taking ASA daily.

## 2013-09-08 NOTE — Assessment & Plan Note (Addendum)
Patient willing to go for colonoscopy, placed referral to GI.  This will likely be her last screening colonoscopy given age and life expectancy.

## 2013-09-08 NOTE — Patient Instructions (Addendum)
Follow-up with Dr. Aundria Rudogers in 2 months.   Keep taking your blood pressure medicine as prescribed.  You should have plenty of refills on that medicine.    We are going to try to get records from [facility] because we need to know what your mental health diagnosis is before we prescribe you depression medicine.  If you would like to be seen there and request medication refills in the meantime, you may attend the walk in clinic.    We referred you to sports medicine today for your knee pain.  We also gave you a prescription for pain medicine called TRAMADOL.  Do not lose this prescription because we will not be able to replace it again.   We are referred you to a stomach doctor today for colonoscopy.  They will call you with the appointment time.   We are checking a blood test today, I will let you know if the results are abnormal.    Colonoscopy A colonoscopy is an exam to look at the entire large intestine (colon). This exam can help find problems such as tumors, polyps, inflammation, and areas of bleeding. The exam takes about 1 hour.  LET [facility] PROVID[facility] KNOW ABOUT:   Any allergies you have.  All medicines you are taking, including vitamins, herbs, eye drops, creams, and over-the-counter medicines.  Previous problems you or members of your family have had with the use of anesthetics.  Any blood disorders you have.  Previous surgeries you have had.  Medical conditions you have. RISKS AND COMPLICATIONS  Generally, this is a safe procedure. However, as with any procedure, complications can occur. Possible complications include:  Bleeding.  Tearing or rupture of the colon wall.  Reaction to medicines given during the exam.  Infection (rare). BEFORE THE PROCEDURE   Ask your health care provider about changing or stopping your regular medicines.  You may be prescribed an oral bowel prep. This involves drinking a large amount of medicated liquid, starting the day before your  procedure. The liquid will cause you to have multiple loose stools until your stool is almost clear or light green. This cleans out your colon in preparation for the procedure.  Do not eat or drink anything else once you have started the bowel prep, unless your health care provider tells you it is safe to do so.  Arrange for someone to drive you home after the procedure. PROCEDURE   You will be given medicine to help you relax (sedative).  You will lie on your side with your knees bent.  A long, flexible tube with a light and camera on the end (colonoscope) will be inserted through the rectum and into the colon. The camera sends video back to a computer screen as it moves through the colon. The colonoscope also releases carbon dioxide gas to inflate the colon. This helps your health care provider see the area better.  During the exam, your health care provider may take a small tissue sample (biopsy) to be examined under a microscope if any abnormalities are found.  The exam is finished when the entire colon has been viewed. AFTER THE PROCEDURE   Do not drive for 24 hours after the exam.  You may have a small amount of blood in your stool.  You may pass moderate amounts of gas and have mild abdominal cramping or bloating. This is caused by the gas used to inflate your colon during the exam.  Ask when your test results will be  ready and how you will get your results. Make sure you get your test results. Document Released: 06/06/2000 Document Revised: 03/30/2013 Document Reviewed: 02/14/2013 [facility] Patient Information 2014 [place], [place].

## 2013-09-08 NOTE — Assessment & Plan Note (Signed)
Patient has OA of both knees and continues to complain of bilateral knee pain.  Pain has been improved in past on tramadol 50 mg daily, but patient was unable to fill this after last visit due to financial constraints and now unable to locate prescription.  Tried to run patient's name in narcotics database, but system was down.  Provided repeat prescription for tramadol 50 mg daily prn #30 R#1 with understanding that we will not be able to replace it if she loses it again.  She has seen sports medicine and received steroid injections in past, would like us to refer her there again today.  Appointment scheduled with Dr. Margaretha Sheffieldraper prior to patient leaving clinic.

## 2013-09-08 NOTE — Assessment & Plan Note (Signed)
Patient requesting refill on Prozac today.  Previously [facility] but states she is not going back there.  States she has diagnosis of bipolar disorder but mainly feels depressed, denies any previous episodes of mania.  No SI/HI.   Placed social work consult to help obtain records from [facility] as I was not comfortable prescribing SSRI to patient with bipolar disorder.  Explained this plan to patient, and she was amenable.

## 2013-09-08 NOTE — Assessment & Plan Note (Signed)
BP controlled for age at 68132/85 today.  Continue lisinopril-HCTZ 20-25 mg daily.  BMP today.

## 2013-09-08 NOTE — Progress Notes (Signed)
Patient ID: Deborah [name], female   DOB: [birth year], 68 y.o.   MRN: [id number]   Subjective:   Patient ID: Deborah [name] female   DOB: Jul 24, 1945 68 y.o.   MRN: [id number]  HPI: Deborah [name] is a 68 y.o. woman with history of HTN, stroke, asthma, bipolar disorder, osteoarthritis of both knees who presents for routine follow-up.    Patient reports good compliance with lisinopril-HCTZ and ASA.  BP controlled at 132/85.  No headache, chest pain, nausea, dizziness.  However, she continues to have bilateral 3/10 knee pain that keeps her from getting around as much as usual.  She was unable to fill tramadol after last visit due to financial constraints and now cannot find prescription.  Interested in seeing sports medicine again as well.   She reported depression on nursing screen today.  Tells me she is depressed daily and not thinking as clearly since not taking her depression medicine (previously on Prozac, Seroquel per our records).  No SI/HI.  She had been following at [facility], states she is not going back there and would like [facility] to prescribe anti-depressant.    Now amenable to colonoscopy.    Past Medical History  Diagnosis Date  . Hypertension   . Hematuria   . Trichomonal vaginitis   . Asthma   . Anxiety   . Headache, chronic daily   . CVA (cerebral vascular accident)   . S/P TAH-BSO   . Tobacco abuse   . Cocaine abuse   . Alcohol abuse, in remission   . Degenerative joint disease     back  . Fracture of hand   . Inadequate material resources   . Mental/behavioral problem    Current Outpatient Prescriptions  Medication Sig Dispense Refill  . aspirin 81 MG tablet Take 81 mg by mouth daily.        Marland Kitchen lisinopril-hydrochlorothiazide (PRINZIDE,ZESTORETIC) 20-25 MG per tablet Take 1 tablet by mouth daily.  30 tablet  5  . FLUoxetine (PROZAC) 20 MG capsule Take 1 capsule (20 mg total) by mouth daily.  30 capsule  5  . SEROQUEL XR 50 MG TB24 Take 2 tablets by mouth at bedtime.         . traMADol (ULTRAM) 50 MG tablet take 1 tablet by mouth twice a day if needed for pain  30 tablet  1   Current Facility-Administered Medications  Medication Dose Route Frequency Provider Last Rate Last Dose  . methylPREDNISolone acetate (DEPO-MEDROL) injection 80 mg  80 mg Intra-articular Once Sherrell Puller, MD       Family History  Problem Relation Age of Onset  . Cancer Father     colon cancer  . Cancer Paternal Aunt     colon cancer   History   Social History  . Marital Status: Divorced    Spouse Name: N/A    Number of Children: N/A  . Years of Education: N/A   Social History Main Topics  . Smoking status: Former Smoker -- 0.10 packs/day    Types: Cigarettes    Quit date: 02/25/2011  . Smokeless tobacco: Current User  . Alcohol Use: No  . Drug Use: No  . Sexual Activity: None   Other Topics Concern  . None   Social History Narrative  . None   Review of Systems: Review of Systems  Constitutional: Negative for fever.  Eyes: Negative for blurred vision.  Respiratory: Negative for cough and shortness of breath.   Cardiovascular: Negative for chest pain.  Gastrointestinal:  Negative for nausea, vomiting, abdominal pain, diarrhea and constipation.  Genitourinary: Negative for dysuria.  Musculoskeletal: Positive for falls and joint pain.       Bilateral knee pain 3 falls last year   Neurological: Negative for loss of consciousness and headaches.    Objective:  Physical Exam: Filed Vitals:   09/08/13 1355  BP: 132/85  Pulse: 73  Temp: 98.9 F (37.2 C)  TempSrc: Oral  Height: 5\' 4"  (1.626 m)  Weight: 178 lb 4.8 oz (80.876 kg)  SpO2: 99%   General: alert, cooperative, and in no apparent distress HEENT: NCAT, vision grossly intact, oropharynx clear and non-erythematous  Neck: supple, no lymphadenopathy Lungs: clear to ascultation bilaterally, normal work of respiration, no wheezes, rales, ronchi Heart: regular rate and rhythm, no murmurs, gallops, or  rubs Abdomen: soft, non-tender, non-distended, normal bowel sounds Extremities: 2+ DP/PT pulses bilaterally, no cyanosis, clubbing, or edema Neurologic: alert & oriented X3, cranial nerves II-XII intact, strength grossly intact, sensation intact to light touch  Assessment & Plan:  Patient discussed with Dr. Rogelia BogaButcher.  Please see problem-based assessment and plan.

## 2013-09-09 LAB — BASIC METABOLIC PANEL WITH GFR
BUN: 20 mg/dL (ref 6–23)
CHLORIDE: 103 meq/L (ref 96–112)
CO2: 26 meq/L (ref 19–32)
Calcium: 10 mg/dL (ref 8.4–10.5)
Creat: 0.96 mg/dL (ref 0.50–1.10)
GFR, EST NON AFRICAN AMERICAN: 61 mL/min
GFR, Est African American: 70 mL/min
Glucose, Bld: 88 mg/dL (ref 70–99)
POTASSIUM: 4.1 meq/L (ref 3.5–5.3)
Sodium: 140 mEq/L (ref 135–145)

## 2013-09-09 NOTE — Progress Notes (Signed)
Case discussed with Dr. Rogers at the time of the visit.  We reviewed the resident's history and exam and pertinent patient test results.  I agree with the assessment, diagnosis, and plan of care documented in the resident's note. 

## 2013-09-14 ENCOUNTER — Encounter: Payer: Self-pay | Admitting: Sports Medicine

## 2013-09-14 ENCOUNTER — Ambulatory Visit (INDEPENDENT_AMBULATORY_CARE_PROVIDER_SITE_OTHER): Payer: Medicare Other | Admitting: Sports Medicine

## 2013-09-14 VITALS — BP 138/81 | Ht 64.0 in | Wt 175.0 lb

## 2013-09-14 DIAGNOSIS — M1710 Unilateral primary osteoarthritis, unspecified knee: Secondary | ICD-10-CM

## 2013-09-14 DIAGNOSIS — M25569 Pain in unspecified knee: Secondary | ICD-10-CM

## 2013-09-14 DIAGNOSIS — M25562 Pain in left knee: Principal | ICD-10-CM

## 2013-09-14 DIAGNOSIS — M25561 Pain in right knee: Secondary | ICD-10-CM

## 2013-09-14 DIAGNOSIS — IMO0002 Reserved for concepts with insufficient information to code with codable children: Secondary | ICD-10-CM

## 2013-09-14 DIAGNOSIS — M179 Osteoarthritis of knee, unspecified: Secondary | ICD-10-CM

## 2013-09-14 MED ORDER — METHYLPREDNISOLONE ACETATE 40 MG/ML IJ SUSP
40.0000 mg | Freq: Once | INTRAMUSCULAR | Status: AC
  Administered 2013-09-14: 40 mg via INTRA_ARTICULAR

## 2013-09-14 NOTE — Progress Notes (Addendum)
   Subjective:    Patient ID: Deborah [name], female    DOB: 1946/05/29, 68 y.o.   MRN: [id number]  HPI  [name] presents complaining of a 3 year history of bilaterally knee pain. She cannot recall any specific injury to the knees but they have been getting worse over the years. Left is worse than Right. Worse at night with pain waking her up and when walking. She said that the right has buckled but denies buckling or locking in the left. She states that the knees have gotten worse over the last year with approximately a 20 lb weight gain. Improved with Tramadol 50 MG and heat.    Review of Systems Negative apart from HPI     Objective:   Physical Exam Well-developed, well-nourished. No acute distress. Awake alert and oriented x3. Vital signs are reviewed.  Knee Inspection: Trace-1+ effusion in the left. No effusion on the right. No erythema. Palpation: Mild tenderness to palpation along the medial joint line bilaterally.  ROM: Range of motion is 0-120 bilaterally. Special tests: Knees are grossly stable to ligamentous exam. Neurovascularly intact distally. Walking with a limp.  Last X-rays were 2012 and showed tricompartmental osteoarthritis bilaterally    Assessment & Plan:    1. Bilateral knee pain secondary to DJD  --Bilateral, Lateral joint space injections of 40 MG Methylprednisolone. --Follow-up in 4 weeks. If pain persists I will start with getting updated standing x-rays of each knee.  Consent obtained and verified. Time-out conducted. Noted no overlying erythema, induration, or other signs of local infection. Skin prepped in a sterile fashion. Topical analgesic spray: Ethyl chloride. Joint: right knee Needle: 25g 1.5 inch Completed without difficulty. Meds: 3cc 1% xylocaine, 1cc (40mg ) depomedrol  Consent obtained and verified. Time-out conducted. Noted no overlying erythema, induration, or other signs of local infection. Skin prepped in a sterile  fashion. Topical analgesic spray: Ethyl chloride. Joint: left knee Needle: 25g 1.5 inch Completed without difficulty. Meds: 3cc 1% xylocaine, 1cc (40mg ) depomedrol  Advised to call if fevers/chills, erythema, induration, drainage, or persistent bleeding.   Advised to call if fevers/chills, erythema, induration, drainage, or persistent bleeding.   Seen with Howell RucksJessica Rein, MS4

## 2013-09-15 ENCOUNTER — Telehealth: Payer: Self-pay | Admitting: Licensed Clinical Social Worker

## 2013-09-15 ENCOUNTER — Encounter: Payer: Self-pay | Admitting: Licensed Clinical Social Worker

## 2013-09-15 NOTE — Telephone Encounter (Signed)
[name] was referred to CSW to obtain/request ROI for records from [facility].  CSW placed called to pt.  CSW left message requesting return call. CSW provided contact hours and phone number.  CSW will send out ROI and letter requesting ROI.  In addition, CSW will offer assistance with finding another behavioral health provider should patient desire.

## 2013-09-19 ENCOUNTER — Encounter: Payer: Self-pay | Admitting: *Deleted

## 2013-09-30 ENCOUNTER — Other Ambulatory Visit: Payer: Self-pay

## 2013-10-10 ENCOUNTER — Encounter: Payer: Self-pay | Admitting: Internal Medicine

## 2013-10-17 NOTE — Addendum Note (Signed)
Addended by: Neomia DearPOWERS, Nichalas Coin E on: 10/17/2013 04:50 PM   Modules accepted: Orders

## 2013-11-11 ENCOUNTER — Encounter: Payer: Medicare Other | Admitting: Internal Medicine

## 2013-11-28 ENCOUNTER — Other Ambulatory Visit: Payer: Self-pay | Admitting: *Deleted

## 2013-11-28 DIAGNOSIS — M1712 Unilateral primary osteoarthritis, left knee: Secondary | ICD-10-CM

## 2013-11-29 MED ORDER — TRAMADOL HCL 50 MG PO TABS: ORAL_TABLET | ORAL | Status: DC

## 2013-11-29 NOTE — Telephone Encounter (Signed)
Tramadol rx faxed to [facility].

## 2014-01-05 ENCOUNTER — Telehealth: Payer: Self-pay | Admitting: *Deleted

## 2014-01-05 NOTE — Telephone Encounter (Signed)
i have tried to call pt without answer, unable to leave message

## 2014-01-05 NOTE — Telephone Encounter (Signed)
Attempts to contact patient for follow-up following her 09/08/13 visit with Dr. Aundria Rudogers appear to have been unsuccessful, and she cancelled an appointment with Dr. Aundria Rudogers for 5/22.  Please see if we can get her in for a clinic visit today or tomorrow.

## 2014-01-06 NOTE — Telephone Encounter (Signed)
i have tried to call pt once again and left message

## 2014-01-11 NOTE — Telephone Encounter (Signed)
Have attempted several times to reach pt, no rtc from pt

## 2014-01-18 NOTE — Telephone Encounter (Signed)
Pt called and stated her phone had run out of minutes but she needed her medicine, appt was scheduled for 8/5 at 0815 dr Johna Rolesrabbani, states she wants BP medicine and tramadol

## 2014-01-25 ENCOUNTER — Ambulatory Visit: Payer: Medicare Other | Admitting: Internal Medicine

## 2014-01-30 ENCOUNTER — Encounter: Payer: Self-pay | Admitting: Internal Medicine

## 2014-01-30 ENCOUNTER — Encounter: Payer: Self-pay | Admitting: Licensed Clinical Social Worker

## 2014-01-30 ENCOUNTER — Ambulatory Visit (INDEPENDENT_AMBULATORY_CARE_PROVIDER_SITE_OTHER): Payer: PRIVATE HEALTH INSURANCE | Admitting: Internal Medicine

## 2014-01-30 VITALS — BP 191/104 | HR 70 | Temp 97.6°F | Ht 64.0 in | Wt 173.8 lb

## 2014-01-30 DIAGNOSIS — F3289 Other specified depressive episodes: Secondary | ICD-10-CM

## 2014-01-30 DIAGNOSIS — I6350 Cerebral infarction due to unspecified occlusion or stenosis of unspecified cerebral artery: Secondary | ICD-10-CM

## 2014-01-30 DIAGNOSIS — M170 Bilateral primary osteoarthritis of knee: Secondary | ICD-10-CM

## 2014-01-30 DIAGNOSIS — F329 Major depressive disorder, single episode, unspecified: Secondary | ICD-10-CM

## 2014-01-30 DIAGNOSIS — I10 Essential (primary) hypertension: Secondary | ICD-10-CM

## 2014-01-30 DIAGNOSIS — M1710 Unilateral primary osteoarthritis, unspecified knee: Secondary | ICD-10-CM

## 2014-01-30 MED ORDER — LISINOPRIL-HYDROCHLOROTHIAZIDE 20-25 MG PO TABS: 1.0000 | ORAL_TABLET | Freq: Every day | ORAL | Status: DC

## 2014-01-30 MED ORDER — TRAMADOL HCL 50 MG PO TABS: ORAL_TABLET | ORAL | Status: DC

## 2014-01-30 NOTE — Assessment & Plan Note (Addendum)
Assessment: Pt with probable major depressive disorder and questionable diagnosis of bipolar disorder compliant with SSRI therapy and noncompliant with anti-psychotic therapy who presents with depressed mood without SI.   Plan:  -Continue fluoxetine 20 mg daily  -Pt reports not taking seroquel XR 50 mg daily for past 2-3 months and refuses to take medication -Pt met with SW, Golden Hurter and given resources for mental health facilities  -Pt instructed of importance of following up with mental health services for management of her medications  -Refer to [facility] for support services

## 2014-01-30 NOTE — Progress Notes (Signed)
Patient ID: Deborah [name], female   DOB: 11-May-1946, 68 y.o.   MRN: [id number]    Subjective:   Patient ID: Deborah Deborah [name] female   DOB: 11-May-1946 68 y.o.   MRN: [id number]  HPI: [name] is a 68 y.o. woman with past medical history of hypertension, asthma, CVA, depression/anxiety, and knee OA who presents for routine clinic visit.   She reports running out of her blood pressure medication, lisinopril-HCTZ for about 1 week. She reports occasional headache and lightheadedness, but denies chest pain, palpitations, blurry vision, or LE edema.  She has quit taking seroquel approximately 2-3 months ago due to weight gain. She reports occasional depressed mood, short-term memory loss, difficulty concentrating, and loss of interest in activities,  but denies SI or loss of appetite. She does not follow with [facility] and does not want to return there because of unsatisfactory care. She is compliant with taking prozac. She denies episodes of mania or visual/auditory hallucinations.    She has chronic bilateral osteoarthritis and takes tramadol daily in addition to intraarticular corticosteroid injections every 3-4 months. Her pain is well-controlled. She denies recent injury, fall, or trauma.    Past Medical History  Diagnosis Date  . Hypertension   . Hematuria   . Trichomonal vaginitis   . Asthma   . Anxiety   . Headache, chronic daily   . CVA (cerebral vascular accident)   . S/P TAH-BSO   . Tobacco abuse   . Cocaine abuse   . Alcohol abuse, in remission   . Degenerative joint disease     back  . Fracture of hand   . Inadequate material resources   . Mental/behavioral problem    Current Outpatient Prescriptions  Medication Sig Dispense Refill  . aspirin 81 MG tablet Take 81 mg by mouth daily.        Marland Kitchen. FLUoxetine (PROZAC) 20 MG capsule Take 1 capsule (20 mg total) by mouth daily.  30 capsule  5  . lisinopril-hydrochlorothiazide (PRINZIDE,ZESTORETIC) 20-25 MG per tablet Take 1 tablet  by mouth daily.  30 tablet  5  . SEROQUEL XR 50 MG TB24 Take 2 tablets by mouth at bedtime.       . traMADol (ULTRAM) 50 MG tablet take 1 tablet by mouth twice a day if needed for pain  30 tablet  0   Current Facility-Administered Medications  Medication Dose Route Frequency Provider Last Rate Last Dose  . methylPREDNISolone acetate (DEPO-MEDROL) injection 80 mg  80 mg Intra-articular Once Sherrell PullerSonovia L Johnson, MD       Family History  Problem Relation Age of Onset  . Cancer Father     colon cancer  . Cancer Paternal Aunt     colon cancer   History   Social History  . Marital Status: Divorced    Spouse Name: N/A    Number of Children: N/A  . Years of Education: N/A   Social History Main Topics  . Smoking status: Former Smoker -- 0.10 packs/day    Types: Cigarettes    Quit date: 02/25/2011  . Smokeless tobacco: Current User  . Alcohol Use: No  . Drug Use: No  . Sexual Activity: None   Other Topics Concern  . None   Social History Narrative  . None   Review of Systems: Review of Systems  Constitutional: Negative for fever and chills.       Weight gain  Eyes: Negative for blurred vision.  Respiratory: Positive for cough. Negative for shortness of breath.  Cardiovascular: Negative for chest pain, palpitations and leg swelling.  Gastrointestinal: Negative for nausea, vomiting, abdominal pain, diarrhea and constipation.  Genitourinary: Negative for dysuria, urgency, frequency and hematuria.  Musculoskeletal: Positive for joint pain (bilateral knee). Negative for falls.  Neurological: Positive for dizziness and headaches. Negative for sensory change and focal weakness.  Psychiatric/Behavioral: Positive for depression and memory loss (short-term). Negative for suicidal ideas and hallucinations. The patient has insomnia.      Objective:  Physical Exam: Filed Vitals:   01/30/14 0941  BP: 191/104  Pulse: 70  Temp: 97.6 F (36.4 C)  TempSrc: Oral  Height: 5\' 4"  (1.626  m)  Weight: 173 lb 12.8 oz (78.835 kg)  SpO2: 100%    Physical Exam  Constitutional: She is oriented to person, place, and time. She appears well-developed and well-nourished. No distress.  HENT:  Head: Normocephalic and atraumatic.  Eyes: EOM are normal.  Neck: Normal range of motion. Neck supple.  Cardiovascular: Normal rate and regular rhythm.   Pulmonary/Chest: Effort normal and breath sounds normal. No respiratory distress. She has no wheezes. She has no rales.  Abdominal: Soft. Bowel sounds are normal. She exhibits no distension. There is no tenderness. There is no rebound and no guarding.  Musculoskeletal: Normal range of motion. She exhibits no edema and no tenderness.  Neurological: She is alert and oriented to person, place, and time.  Skin: Skin is warm and dry. No rash noted. She is not diaphoretic. No erythema. No pallor.  Psychiatric:  Short-term memory loss (asking same question again)  Depressed mood    Assessment & Plan:   Please see problem list for problem-based assessment and plan

## 2014-01-30 NOTE — Assessment & Plan Note (Addendum)
Assessment: Pt with bilateral knee osteoarthritis with well-controlled pain with narcotic and intraarticular injection therapy who presents with no recent falls, injuries/trauma, or locking.     Plan:  -Refill tramadol 50 mg BID PRN pain -Continue intraarticular knee injections Q 3-4 months

## 2014-01-30 NOTE — Patient Instructions (Signed)
-  I refilled your medications -Please see mental health services of your choosing  -Will see you back in 2 weeks to recheck your blood presure  General Instructions:   Thank you for bringing your medicines today. This helps us keep you safe from mistakes.   Progress Toward Treatment Goals:  No flowsheet data found.  Self Care Goals & Plans:  Self Care Goal 01/30/2014  Manage my medications take my medicines as prescribed; bring my medications to every visit; refill my medications on time; follow the sick day instructions if I am sick  Eat healthy foods eat more vegetables; eat fruit for snacks and desserts; eat baked foods instead of fried foods; eat smaller portions  Be physically active find an activity I enjoy    No flowsheet data found.   Care Management & Community Referrals:  No flowsheet data found.

## 2014-01-30 NOTE — Progress Notes (Signed)
CSW met with [name] following her [facility] appointment today.  Pt states she was not satisfied with [facility] services in the past and CSW was unclear as to when Deborah [name] stopped receiving care from [facility].  Pt has Medicare/Medicaid and is in agreement for referral to [facility] if they are able to come to her house for the assessment.  Pt did not want to go to any appointment alone.  Deborah [name] initially wanted to wait until her PCS aide could attend with her.  CSW will make referral to [facility] if they are able to assess in the home.  Pt states she is in need of PCS to assist with cleaning and dressing.  Pt states she is able to perform other ADL's.  CSW discussed the assessment process and pt is aware she will have an assessment with [facility].  Confirmed address and phone number.  CSW also discussed referral to [facility], as pt seem to need need constant reminder of what medications and which ones to pick up from pharmacy and which ones would need original prescription to fill.  Pt in agreement for referral to [facility] for additional support.

## 2014-01-30 NOTE — Assessment & Plan Note (Addendum)
Assessment: Pt with well-controlled hypertension compliant with two-class (ACEi & diuretic) anti-hypertensive therapy who presents with asymptomatic blood pressure of 191/104 after running out of medications for past week.   Plan: -BP 191/104 not at goal <140/90 -Refill lisinopril-HCTZ 20-25 mg daily  -Last BMP on 09/08/13 normal  -To return in 2 weeks for blood pressure recheck

## 2014-02-13 ENCOUNTER — Ambulatory Visit: Payer: PRIVATE HEALTH INSURANCE | Admitting: Internal Medicine

## 2014-03-06 ENCOUNTER — Encounter: Payer: Self-pay | Admitting: Internal Medicine

## 2014-03-06 ENCOUNTER — Ambulatory Visit: Payer: Medicare Other | Admitting: Internal Medicine

## 2014-05-31 ENCOUNTER — Encounter: Payer: PRIVATE HEALTH INSURANCE | Admitting: Internal Medicine

## 2014-06-30 DIAGNOSIS — H02834 Dermatochalasis of left upper eyelid: Secondary | ICD-10-CM | POA: Diagnosis not present

## 2014-06-30 DIAGNOSIS — H2513 Age-related nuclear cataract, bilateral: Secondary | ICD-10-CM | POA: Diagnosis not present

## 2014-06-30 DIAGNOSIS — H31012 Macula scars of posterior pole (postinflammatory) (post-traumatic), left eye: Secondary | ICD-10-CM | POA: Diagnosis not present

## 2014-06-30 DIAGNOSIS — H02831 Dermatochalasis of right upper eyelid: Secondary | ICD-10-CM | POA: Diagnosis not present

## 2014-06-30 DIAGNOSIS — H3582 Retinal ischemia: Secondary | ICD-10-CM | POA: Diagnosis not present

## 2014-07-03 DIAGNOSIS — H35033 Hypertensive retinopathy, bilateral: Secondary | ICD-10-CM | POA: Diagnosis not present

## 2014-07-03 DIAGNOSIS — H3589 Other specified retinal disorders: Secondary | ICD-10-CM | POA: Diagnosis not present

## 2014-07-03 DIAGNOSIS — H2513 Age-related nuclear cataract, bilateral: Secondary | ICD-10-CM | POA: Diagnosis not present

## 2014-07-03 DIAGNOSIS — H31002 Unspecified chorioretinal scars, left eye: Secondary | ICD-10-CM | POA: Diagnosis not present

## 2014-07-03 DIAGNOSIS — H3581 Retinal edema: Secondary | ICD-10-CM | POA: Diagnosis not present

## 2014-07-06 ENCOUNTER — Telehealth: Payer: Self-pay | Admitting: *Deleted

## 2014-07-06 ENCOUNTER — Telehealth: Payer: Self-pay | Admitting: Internal Medicine

## 2014-07-06 NOTE — Telephone Encounter (Signed)
Received a page from Dr. Dione BoozeGroat at [facility] concerning patient. He is concerned that patient has impending retinal artery occlusion and thinks she should be evaluated for GCA and have at least carotid dopplers done.  He gave no mention of revascularization as a possibility at this time.  He reports that he faxed a note to Dr. Zada GirtKazibwe, but no such note appears in his box at this time.  Will attempt to find notation.    I attempted to call patient at her provided number, straight to voicemail and voicemail full.  Attempted to call NOK, brother Oralia RudWillie Roy and phone rang with no answer.   I have discussed with attending in the clinic, Dr. Heide SparkNarendra and triage nurse Marin RobertsHelen Atkinson.  Myriam JacobsonHelen will attempt to contact patient again this afternoon to schedule an urgent appointment for further evaluation.   Debe CoderMULLEN, Tremeka Helbling, MD

## 2014-07-06 NOTE — Telephone Encounter (Signed)
Called pt per dr Criselda Peachesmullen to set up appt for tomorrow, pt's vmail full, it did allow me to send an sms? Alert and i did. Also tried to call pt's contact noted on chart, a brother, mr roy, unabe to reach him or leave message. Called [facility] and they have not had contact w/ pt since 2012. Will speak with shanag csw tomorrow and attempt other means of reaching pt

## 2014-07-07 NOTE — Telephone Encounter (Signed)
CSW placed call to pt's apartment manager, [address] #[phone].  Left message requesting assistance to reach [name].  CSW requesting if apartment staff could go to pt's apartment and provide message for pt to contact the [facility] at [phone] for an appointment within the next week.

## 2014-07-07 NOTE — Telephone Encounter (Signed)
i have now called [address] and lm for manager to call me back asap.

## 2014-07-19 NOTE — Telephone Encounter (Signed)
i have called numerous times, continues to go to vmail, i have called the manager of the apt office and left messages, no return calls

## 2014-07-20 NOTE — Telephone Encounter (Signed)
i still cannot contact this pt, i have tried many times by phone and called her apt [employer] manager, left messages for the manager and have not rec'd a call back, until the pt calls our office i am at a loss

## 2014-07-21 NOTE — Telephone Encounter (Signed)
Thanks for trying.   FYI Alexa.  We could not get in touch with this lady re: her eye issues.

## 2014-07-25 ENCOUNTER — Telehealth: Payer: Self-pay | Admitting: Internal Medicine

## 2014-07-25 NOTE — Telephone Encounter (Signed)
Call to patient to confirm appointment for 07/26/14 at 3:15. Unable to leave message mail box full

## 2014-07-26 ENCOUNTER — Encounter: Payer: Self-pay | Admitting: Internal Medicine

## 2014-07-26 ENCOUNTER — Encounter: Payer: Self-pay | Admitting: Pharmacist

## 2014-07-26 ENCOUNTER — Other Ambulatory Visit (HOSPITAL_COMMUNITY): Payer: Self-pay | Admitting: Internal Medicine

## 2014-07-26 ENCOUNTER — Ambulatory Visit (INDEPENDENT_AMBULATORY_CARE_PROVIDER_SITE_OTHER): Payer: Medicare Other | Admitting: Internal Medicine

## 2014-07-26 VITALS — BP 131/66 | HR 75 | Temp 98.3°F | Ht 64.0 in | Wt 171.1 lb

## 2014-07-26 DIAGNOSIS — H34231 Retinal artery branch occlusion, right eye: Secondary | ICD-10-CM | POA: Diagnosis not present

## 2014-07-26 DIAGNOSIS — M1990 Unspecified osteoarthritis, unspecified site: Secondary | ICD-10-CM

## 2014-07-26 DIAGNOSIS — Z23 Encounter for immunization: Secondary | ICD-10-CM

## 2014-07-26 DIAGNOSIS — Z7982 Long term (current) use of aspirin: Secondary | ICD-10-CM

## 2014-07-26 DIAGNOSIS — I10 Essential (primary) hypertension: Secondary | ICD-10-CM | POA: Diagnosis not present

## 2014-07-26 DIAGNOSIS — Z0000 Encounter for general adult medical examination without abnormal findings: Secondary | ICD-10-CM

## 2014-07-26 DIAGNOSIS — M150 Primary generalized (osteo)arthritis: Secondary | ICD-10-CM

## 2014-07-26 DIAGNOSIS — H3411 Central retinal artery occlusion, right eye: Secondary | ICD-10-CM

## 2014-07-26 DIAGNOSIS — M159 Polyosteoarthritis, unspecified: Secondary | ICD-10-CM

## 2014-07-26 DIAGNOSIS — F32A Depression, unspecified: Secondary | ICD-10-CM

## 2014-07-26 DIAGNOSIS — F329 Major depressive disorder, single episode, unspecified: Secondary | ICD-10-CM

## 2014-07-26 DIAGNOSIS — I6350 Cerebral infarction due to unspecified occlusion or stenosis of unspecified cerebral artery: Secondary | ICD-10-CM

## 2014-07-26 DIAGNOSIS — Z8673 Personal history of transient ischemic attack (TIA), and cerebral infarction without residual deficits: Secondary | ICD-10-CM | POA: Diagnosis not present

## 2014-07-26 DIAGNOSIS — M316 Other giant cell arteritis: Secondary | ICD-10-CM

## 2014-07-26 LAB — BASIC METABOLIC PANEL WITH GFR
BUN: 19 mg/dL (ref 6–23)
CO2: 28 mEq/L (ref 19–32)
Calcium: 9.3 mg/dL (ref 8.4–10.5)
Chloride: 106 mEq/L (ref 96–112)
Creat: 0.97 mg/dL (ref 0.50–1.10)
GFR, Est African American: 68 mL/min
GFR, Est Non African American: 59 mL/min — ABNORMAL LOW
GLUCOSE: 103 mg/dL — AB (ref 70–99)
Potassium: 3.5 mEq/L (ref 3.5–5.3)
Sodium: 146 mEq/L — ABNORMAL HIGH (ref 135–145)

## 2014-07-26 LAB — SEDIMENTATION RATE: Sed Rate: 18 mm/hr (ref 0–22)

## 2014-07-26 MED ORDER — FLUOXETINE HCL 20 MG PO CAPS: 20.0000 mg | ORAL_CAPSULE | Freq: Every day | ORAL | Status: DC

## 2014-07-26 MED ORDER — QUETIAPINE FUMARATE ER 50 MG PO TB24: 100.0000 mg | ORAL_TABLET | Freq: Every day | ORAL | Status: DC

## 2014-07-26 MED ORDER — TRAMADOL HCL 50 MG PO TABS: ORAL_TABLET | ORAL | Status: DC

## 2014-07-26 MED ORDER — LISINOPRIL-HYDROCHLOROTHIAZIDE 20-25 MG PO TABS: 1.0000 | ORAL_TABLET | Freq: Every day | ORAL | Status: DC

## 2014-07-26 MED ORDER — ASPIRIN 81 MG PO TABS: 81.0000 mg | ORAL_TABLET | Freq: Every day | ORAL | Status: DC

## 2014-07-26 NOTE — Progress Notes (Signed)
CLINICAL PHARMACY ADHERENCE SUPPORT Deborah [name] is a 69 y.o. female who was reviewed by clinical pharmacy for medication adherence.   Last BP 01/30/2014 191/104, HR 70  Medication Refills Per [facility], last refill was October 2015 for 30 day supply of lisinopril/HCTZ  Notified PCP of potential ongoing medication non-adherence. Please let me know if I can be of further assistance in patient's care.

## 2014-07-26 NOTE — Patient Instructions (Signed)
General Instructions:   Thank you for bringing your medicines today. This helps us keep you safe from mistakes.   FOLLOW UP WITH DR. Dione BoozeGROAT ON August 09, 2014 AT 1:30 PM.  Central Retinal Artery Occlusion Central retinal artery occlusion is when the artery that supplies blood to the retina is blocked. The retina is the layer of cells at the back of the eye that let us see light and color. These cells send light images to the brain by way of the optic nerve. The optic nerve connects directly to the brain from the back of the eye. If the central retinal artery is blocked, cells in the retina needed for vision die. A central retinal artery occlusion is a stroke of the retina and usually results in blindness of the affected eye. About 14% of people have a small extra artery (cilioretinal artery) coming off the central retinal artery and supplies blood to a key part of the retina. These people are very lucky if they have a central retinal artery occlusion because they may retain some vision. CAUSES  There are many different things that could cause the blockage such as:  Hardening of the arteries (arteriosclerosis).  High blood pressure.  High pressure in the eye from long occurring (chronic) or sudden (acute) glaucoma.  Swelling of the optic nerve. This can happen from inflammation of the nerve itself or from high pressure inside the head around the brain.  A small piece of fat traveling in the blood stream (fat embolus).  A blood clot in the artery.  A condition called arteritis (a response to injury or infection by an artery).  Migraine headache. This condition is more likely to happen in someone with:  Diabetes.  High blood pressure.  An abnormal heart rhythm.  Certain types of arthritis.  Certain blood conditions.  Certain heart valve problems.  Narrowing of the main arteries in the neck that supply blood to the brain (the carotid arteries). SYMPTOMS   Sudden, painless,  total loss of vision in one eye. There are no symptoms before it happens.  If only a branch of this artery is blocked, there may be partial loss of vision coming from the area of the retina that is not receiving blood. DIAGNOSIS  An ophthalmologist can usually diagnose this condition by examining the inside of the eye. Some of the usual findings of swelling only appear for a short time. By the time they go away, the retinal cells are damaged for good and cannot be restored.  TREATMENT  In very rare cases, sudden lowering of pressure in the eye by using medicines and actually draining some of the eye fluids can restore blood flow. This is rarely successful. Treatments that may be used are:  Medicines that lower the pressure inside the eye.  Medicines that can improve blood supply to the retina.  Oxygen with a small amount of carbon dioxide. It is important to get as much oxygen as possible to the retina. The carbon dioxide can help open blood vessels and let more oxygen and blood get to the retina. SEEK IMMEDIATE MEDICAL CARE IF:  You suddenly have a total loss of vision in one eye. It may not be possible to restore the vision that has been lost. However, it is important to know what the cause of the central retinal artery occlusion was so that any underlying disease can be treated right away. Document Released: 09/21/2000 Document Revised: 10/24/2013 Document Reviewed: 07/26/2007 [facility] Patient Information 2015 [employer] [place].  This information is not intended to replace advice given to you by your health care provider. Make sure you discuss any questions you have with your health care provider.

## 2014-07-26 NOTE — Progress Notes (Signed)
   Subjective:    Patient ID: Deborah [name], female    DOB: May 29, 1946, 69 y.o.   MRN: [id number]  HPI [name] is a 69 yo female with PMHx of CVA, HTN, Asthma, Depression, DJD and possible GCA with impending retinal artery occlusion who presents today for follow up.   Review of Systems  General: Admits to fever, chills HEENT: Left eye blurry vision Respiratory: Denies SOB, cough   Cardiovascular: Denies chest pain and palpitations  Gastrointestinal: Denies nausea, vomiting, abdominal pain, diarrhea, constipation, blood in stool and abdominal distention.  Genitourinary: Denies dysuria, urgency, frequency, hematuria, suprapubic pain and flank pain. Endocrine: Denies hot or cold intolerance, polyuria, and polydipsia. Musculoskeletal: Denies myalgias, back pain, joint swelling, arthralgias and gait problem.  Skin: Denies pallor, rash and wounds.  Neurological: Admits to headache and tenderness over temporals. Denies dizziness, weakness, lightheadedness, numbness, Psychiatric/Behavioral: Denies mood changes, confusion, nervousness, sleep disturbance and agitation.  Past Medical History  Diagnosis Date  . Hypertension   . Hematuria   . Trichomonal vaginitis   . Asthma   . Anxiety   . Headache, chronic daily   . CVA (cerebral vascular accident)   . S/P TAH-BSO   . Tobacco abuse   . Cocaine abuse   . Alcohol abuse, in remission   . Degenerative joint disease     back  . Fracture of hand   . Inadequate material resources   . Mental/behavioral problem    Outpatient Encounter Prescriptions as of 07/26/2014  Medication Sig  . aspirin 81 MG tablet Take 81 mg by mouth daily.    Marland Kitchen. FLUoxetine (PROZAC) 20 MG capsule Take 1 capsule (20 mg total) by mouth daily.  Marland Kitchen. lisinopril-hydrochlorothiazide (PRINZIDE,ZESTORETIC) 20-25 MG per tablet Take 1 tablet by mouth daily.  . SEROQUEL XR 50 MG TB24 Take 2 tablets by mouth at bedtime.   . traMADol (ULTRAM) 50 MG tablet take 1 tablet by mouth twice  a day if needed for pain      Objective:   Physical Exam Filed Vitals:   07/26/14 1523  BP: 131/66  Pulse: 75  Temp: 98.3 F (36.8 C)  TempSrc: Oral  Height: 5\' 4"  (1.626 m)  Weight: 171 lb 1.6 oz (77.61 kg)  SpO2: 100%   General: Vital signs reviewed.  Patient is well-developed and well-nourished, in no acute distress and cooperative with exam.  Head: Tenderness on palpation of left temporal.  Eyes: EOMI, conjunctivae normal, no scleral icterus, PERRLA, retinal vessels well visualized, no evidence of tear or bleed.  Neck: No carotid bruit present.  Cardiovascular: RRR Pulmonary/Chest: Clear to auscultation bilaterally, no wheezes, rales, or rhonchi. Abdominal: Soft, non-tender, non-distended, BS +, no masses, organomegaly, or guarding present.  Extremities: No lower extremity edema bilaterally.  Skin: Warm, dry and intact. No rashes or erythema. Psychiatric: Normal mood and affect. speech and behavior is normal. Cognition and memory are normal.     Assessment & Plan:   Please see problem based assessment and plan.

## 2014-07-27 ENCOUNTER — Ambulatory Visit (HOSPITAL_COMMUNITY): Payer: Medicare Other

## 2014-07-27 LAB — C-REACTIVE PROTEIN: CRP: <0.5 mg/dL — ABNORMAL LOW (ref <0.60)

## 2014-07-27 NOTE — Assessment & Plan Note (Signed)
Patient received Prevnar vaccine.

## 2014-07-27 NOTE — Assessment & Plan Note (Signed)
Ruled out. ESR and CRP normal. Given history of vision change, blurry vision in right eye with decreased blood flow to right eye and with complaints of headache, fever, chills and tenderness on palpation of left temporal, we needed to rule out GCA/Temporal arteritis. Given lab results, patient did not require steroids and we will continue work up as listed under retinal artery occlusion.

## 2014-07-27 NOTE — Assessment & Plan Note (Signed)
Patient recently seen by Dr. Katy Fitch who was concerned for impending retinal occlusion of right eye given symptoms of blurry vision, decreased vision, and findings of decreased blood flow to entire right eye. Dr. Katy Fitch [facility] after appointment. Dr. Katy Fitch and [facility] staff both attempted to notify patient several times, but were unable to get a hold of her. Patient presented incidentally today for routine follow up. Patient continues to have blurry vision in right eye, unchanged from prior. She admitted to headache, tenderness over left temporal, and fever and chills. She denied jaw claudication. I discussed this case with Dr. Katy Fitch and my attendings and we felt that patient warranted STAT ESR and CRP to evaluate for GCA/Temporal arteritis as a cause. ESR returned as normal, and patient was discharged with plans below for further work up. CRP normal. BMET normal  Plan: -Carotid dopplers on 07/27/14 -Continued work up for causes of arteritis including ANA at next visit -Follow up with Dr. Katy Fitch on 08/09/14 -Follow up with Dr. Posey Pronto, retinal specialist, September 04, 2014 at 2 pm

## 2014-07-27 NOTE — Assessment & Plan Note (Signed)
Prior history of CVA. Patient is compliant with ASA 81 mg daily, but is not on statin. Unable to address during this visit.   Plan: -Consider addition of statin at follow up visit -Continue ASA 81 mg daily

## 2014-07-27 NOTE — Assessment & Plan Note (Signed)
BP Readings from Last 3 Encounters:  07/26/14 131/66  01/30/14 191/104  09/14/13 138/81    Lab Results  Component Value Date   NA 146* 07/26/2014   K 3.5 07/26/2014   CREATININE 0.97 07/26/2014    Assessment: Blood pressure control:  Controlled Progress toward BP goal:   At goal Comments: Compliant with lisinopril-HCTZ 20-25 mg daily  Plan: Medications:  continue current medications Other plans: Rechecked BMET which was within normal limits

## 2014-07-27 NOTE — Assessment & Plan Note (Signed)
Refilled Tramadol 50 mg BID prn pain.

## 2014-07-28 ENCOUNTER — Ambulatory Visit (HOSPITAL_COMMUNITY)
Admission: RE | Admit: 2014-07-28 | Discharge: 2014-07-28 | Disposition: A | Discharge status: Home / Self Care | Payer: Medicare Other | Source: Ambulatory Visit | Attending: Internal Medicine | Admitting: Internal Medicine

## 2014-07-28 DIAGNOSIS — H3411 Central retinal artery occlusion, right eye: Secondary | ICD-10-CM

## 2014-07-28 DIAGNOSIS — I6523 Occlusion and stenosis of bilateral carotid arteries: Secondary | ICD-10-CM | POA: Insufficient documentation

## 2014-07-28 NOTE — Progress Notes (Signed)
Bilateral carotid artery duplex completed:  1-39% ICA stenosis.  Vertebral artery flow is antegrade.     

## 2014-07-31 NOTE — Progress Notes (Signed)
Case discussed with Dr. Richardson at the time of the visit.  We reviewed the resident's history and exam and pertinent patient test results.  I agree with the assessment, diagnosis, and plan of care documented in the resident's note. 

## 2014-08-04 ENCOUNTER — Ambulatory Visit: Payer: Medicare Other | Admitting: Internal Medicine

## 2014-08-30 ENCOUNTER — Encounter: Payer: Self-pay | Admitting: *Deleted

## 2014-12-07 ENCOUNTER — Other Ambulatory Visit: Payer: Self-pay | Admitting: Internal Medicine

## 2014-12-07 DIAGNOSIS — Z1231 Encounter for screening mammogram for malignant neoplasm of breast: Secondary | ICD-10-CM

## 2014-12-11 ENCOUNTER — Ambulatory Visit: Payer: Medicare Other

## 2014-12-12 ENCOUNTER — Ambulatory Visit: Payer: Medicare Other

## 2015-03-14 ENCOUNTER — Encounter: Payer: Medicare Other | Admitting: Internal Medicine

## 2015-03-14 ENCOUNTER — Encounter: Payer: Self-pay | Admitting: Internal Medicine

## 2015-05-02 ENCOUNTER — Encounter: Payer: Self-pay | Admitting: Internal Medicine

## 2015-05-08 ENCOUNTER — Encounter: Payer: Self-pay | Admitting: Student

## 2015-05-21 ENCOUNTER — Ambulatory Visit: Payer: Medicare Other | Admitting: Internal Medicine

## 2015-05-21 ENCOUNTER — Encounter: Payer: Self-pay | Admitting: Internal Medicine

## 2015-08-15 ENCOUNTER — Encounter: Payer: Medicare Other | Admitting: Internal Medicine

## 2015-09-04 ENCOUNTER — Telehealth: Payer: Self-pay | Admitting: Internal Medicine

## 2015-09-04 ENCOUNTER — Other Ambulatory Visit: Payer: Self-pay | Admitting: Internal Medicine

## 2015-09-04 NOTE — Telephone Encounter (Signed)
APPT. REMINDER CALL, LMTCB °

## 2015-09-05 ENCOUNTER — Encounter: Payer: Self-pay | Admitting: Internal Medicine

## 2015-09-05 ENCOUNTER — Ambulatory Visit (INDEPENDENT_AMBULATORY_CARE_PROVIDER_SITE_OTHER): Payer: Commercial Managed Care - HMO | Admitting: Internal Medicine

## 2015-09-05 VITALS — BP 191/100 | HR 68 | Temp 98.0°F | Ht 64.0 in | Wt 166.2 lb

## 2015-09-05 DIAGNOSIS — Z7982 Long term (current) use of aspirin: Secondary | ICD-10-CM

## 2015-09-05 DIAGNOSIS — Z8673 Personal history of transient ischemic attack (TIA), and cerebral infarction without residual deficits: Secondary | ICD-10-CM | POA: Diagnosis not present

## 2015-09-05 DIAGNOSIS — F329 Major depressive disorder, single episode, unspecified: Secondary | ICD-10-CM

## 2015-09-05 DIAGNOSIS — Z0000 Encounter for general adult medical examination without abnormal findings: Secondary | ICD-10-CM

## 2015-09-05 DIAGNOSIS — F32A Depression, unspecified: Secondary | ICD-10-CM

## 2015-09-05 DIAGNOSIS — Z23 Encounter for immunization: Secondary | ICD-10-CM

## 2015-09-05 DIAGNOSIS — Z1211 Encounter for screening for malignant neoplasm of colon: Secondary | ICD-10-CM

## 2015-09-05 DIAGNOSIS — Z79899 Other long term (current) drug therapy: Secondary | ICD-10-CM

## 2015-09-05 DIAGNOSIS — Z131 Encounter for screening for diabetes mellitus: Secondary | ICD-10-CM

## 2015-09-05 DIAGNOSIS — I10 Essential (primary) hypertension: Secondary | ICD-10-CM

## 2015-09-05 LAB — GLUCOSE, CAPILLARY: GLUCOSE-CAPILLARY: 69 mg/dL (ref 65–99)

## 2015-09-05 LAB — POCT GLYCOSYLATED HEMOGLOBIN (HGB A1C): Hemoglobin A1C: 5.9

## 2015-09-05 MED ORDER — ATORVASTATIN CALCIUM 40 MG PO TABS: 40.0000 mg | ORAL_TABLET | Freq: Every day | ORAL | Status: DC

## 2015-09-05 MED ORDER — FLUOXETINE HCL 20 MG PO CAPS: 20.0000 mg | ORAL_CAPSULE | Freq: Every day | ORAL | Status: DC

## 2015-09-05 MED ORDER — LISINOPRIL-HYDROCHLOROTHIAZIDE 20-25 MG PO TABS: 1.0000 | ORAL_TABLET | Freq: Every day | ORAL | Status: DC

## 2015-09-05 MED ORDER — QUETIAPINE FUMARATE ER 50 MG PO TB24: 100.0000 mg | ORAL_TABLET | Freq: Every day | ORAL | Status: DC

## 2015-09-05 NOTE — Assessment & Plan Note (Signed)
BP Readings from Last 3 Encounters:  09/05/15 191/100  07/26/14 131/66  01/30/14 191/104    Lab Results  Component Value Date   NA 146* 07/26/2014   K 3.5 07/26/2014   CREATININE 0.97 07/26/2014    Assessment: Blood pressure control:  Uncontrolled Progress toward BP goal:   Deteriorated Comments: Patient has been out of her lisinopril-HCTZ 20-25 mg daily for one month which she was previously well controlled on. Patient admits to headache but denies vision changes, chest pain or shortness of breath.   Plan: Medications:  refilled medication Educational resources provided:  Educated on importance of not running out of medications Other plans: Follow up in 2-4 weeks for BP recheck

## 2015-09-05 NOTE — Assessment & Plan Note (Signed)
Patient admits to history of depression. Per notes, patient appears to have diagnosis of bipolar disorder and depression and previously followed with [facility]. She requests refills on Seroquel 100 mg QHS and fluoxetine 20 mg daily.   Plan: -Continue current medications -[facility]

## 2015-09-05 NOTE — Progress Notes (Signed)
Subjective:    Patient ID: Deborah [name], female    DOB: 1946-02-13, 70 y.o.   MRN: [id number]  HPI Deborah [name] is a 70 y.o. female with PMHx of HTN, Tobacco abuse, Depression who presents to the clinic for HTN. Please see A&P for the status of the patient's chronic medical problems.   Past Medical History  Diagnosis Date  . Hypertension   . Hematuria   . Trichomonal vaginitis   . Asthma   . Anxiety   . Headache, chronic daily   . CVA (cerebral vascular accident) (HCC)   . S/P TAH-BSO   . Tobacco abuse   . Cocaine abuse   . Alcohol abuse, in remission   . Degenerative joint disease     back  . Fracture of hand   . Inadequate material resources   . Mental/behavioral problem     Outpatient Encounter Prescriptions as of 09/05/2015  Medication Sig  . aspirin 81 MG tablet Take 1 tablet (81 mg total) by mouth daily.  Marland Kitchen atorvastatin (LIPITOR) 40 MG tablet Take 1 tablet (40 mg total) by mouth daily.  Marland Kitchen FLUoxetine (PROZAC) 20 MG capsule Take 1 capsule (20 mg total) by mouth daily.  Marland Kitchen lisinopril-hydrochlorothiazide (PRINZIDE,ZESTORETIC) 20-25 MG tablet Take 1 tablet by mouth daily.  . QUEtiapine (SEROQUEL XR) 50 MG TB24 24 hr tablet Take 2 tablets (100 mg total) by mouth at bedtime.  . [DISCONTINUED] FLUoxetine (PROZAC) 20 MG capsule Take 1 capsule (20 mg total) by mouth daily.  . [DISCONTINUED] lisinopril-hydrochlorothiazide (PRINZIDE,ZESTORETIC) 20-25 MG per tablet Take 1 tablet by mouth daily.  . [DISCONTINUED] QUEtiapine (SEROQUEL XR) 50 MG TB24 24 hr tablet Take 2 tablets (100 mg total) by mouth at bedtime.   Facility-Administered Encounter Medications as of 09/05/2015  Medication  . methylPREDNISolone acetate (DEPO-MEDROL) injection 80 mg    Family History  Problem Relation Age of Onset  . Cancer Father     colon cancer  . Cancer Paternal Aunt     colon cancer    Social History   Social History  . Marital Status: Divorced    Spouse Name: N/A  . Number of Children:  N/A  . Years of Education: N/A   Occupational History  . Not on file.   Social History Main Topics  . Smoking status: Former Smoker -- 0.10 packs/day    Types: Cigarettes    Quit date: 02/25/2011  . Smokeless tobacco: Current User     Comment: quit x 2 months.  . Alcohol Use: No  . Drug Use: No  . Sexual Activity: Not on file   Other Topics Concern  . Not on file   Social History Narrative   Review of Systems General: Denies fever, chills, fatigue, change in appetite  HEENT: Admits to headache. Denies change in vision. Respiratory: Denies SOB, cough, DOE, chest tightness   Cardiovascular: Denies chest pain and palpitations.  Gastrointestinal: Denies diarrhea, constipation, blood in stool  Endocrine: Denies polyuria, and polydipsia. Neurological: Denies dizziness, weakness, lightheadedness, numbness, seizures Psychiatric/Behavioral: Denies mood changes, and agitation.    Objective:   Physical Exam Filed Vitals:   09/05/15 1547  BP: 191/100  Pulse: 68  Temp: 98 F (36.7 C)  TempSrc: Oral  Height:  (1.626 m)  Weight: 166 lb 3.2 oz (75.388 kg)  SpO2: 100%   General: Vital signs reviewed.  Patient is in no acute distress and cooperative with exam.  Head: Normocephalic and atraumatic. Eyes: EOMI, conjunctivae normal, no scleral icterus.  Neck: Supple, trachea midline, no carotid bruit present.  Cardiovascular: RRR, S1 normal, S2 normal. Pulmonary/Chest: Clear to auscultation bilaterally, no wheezes, rales, or rhonchi. Abdominal: Soft, non-tender, non-distended, BS + Extremities: No lower extremity edema bilaterally Neurological: A&O x3, moving all extremities, sensory intact to light touch bilaterally.  Skin: Warm, dry and intact. No rashes or erythema. Psychiatric: Normal mood and affect. speech and behavior is normal. Cognition and memory are normal.     Assessment & Plan:   Please see problem based assessment and plan.

## 2015-09-05 NOTE — Patient Instructions (Signed)
TAKE LISINOPRIL-HYDROCHLOROTHIAZIDE 20-25 MG DAILY FOR BLOOD PRESSURE.  TAKE FLUOXETINE 20 MG DAILY AND SEROQUEL 100 MG AT NIGHT.  TAKE ATORVASTATIN 40 MG DAILY FOR CHOLESTEROL AND HISTORY OF STROKE.  TAKE ASPIRIN 81 MG DAILY FOR HISTORY OF STROKE.  PLEASE SCHEDULE YOUR SCREENING MAMMOGRAM.  PLEASE COMPLETE THE STOOL CARDS FOR THE SCREENING FOR COLON CANCER.  WE WILL TEST YOUR "A1C" TODAY FOR DIABETES.

## 2015-09-05 NOTE — Assessment & Plan Note (Signed)
Patient reports history of CVA when living in [place]. She has been on ASA 81 mg daily since and hypertensive medications, but no statin.  Plan: -Add atorvastatin 40 mg daily -Continue ASA 81 mg daily -Continue lisinopril/HCTZ

## 2015-09-05 NOTE — Assessment & Plan Note (Signed)
Mammogram: Normal 2013, needs repeat, patient will schedule Colonoscopy: Patient prefers FOBT cards negative 2015, provided with repeat stool cards Flu shot: Received on 09/05/15 A1c: 5.9

## 2015-09-07 NOTE — Progress Notes (Signed)
Internal Medicine Clinic Attending  Case discussed with Dr. Burns soon after the resident saw the patient.  We reviewed the resident's history and exam and pertinent patient test results.  I agree with the assessment, diagnosis, and plan of care documented in the resident's note. 

## 2015-10-09 ENCOUNTER — Telehealth: Payer: Self-pay | Admitting: Internal Medicine

## 2015-10-09 NOTE — Telephone Encounter (Signed)
APPT. REMINDER CALL, LMTCB °

## 2015-10-10 ENCOUNTER — Ambulatory Visit (INDEPENDENT_AMBULATORY_CARE_PROVIDER_SITE_OTHER): Payer: Medicare Other | Admitting: Internal Medicine

## 2015-10-10 ENCOUNTER — Encounter: Payer: Self-pay | Admitting: Internal Medicine

## 2015-10-10 VITALS — BP 153/73 | HR 73 | Temp 98.6°F | Ht 64.0 in | Wt 158.5 lb

## 2015-10-10 DIAGNOSIS — I10 Essential (primary) hypertension: Secondary | ICD-10-CM | POA: Diagnosis not present

## 2015-10-10 DIAGNOSIS — F32A Depression, unspecified: Secondary | ICD-10-CM

## 2015-10-10 DIAGNOSIS — L298 Other pruritus: Secondary | ICD-10-CM

## 2015-10-10 DIAGNOSIS — F329 Major depressive disorder, single episode, unspecified: Secondary | ICD-10-CM

## 2015-10-10 DIAGNOSIS — Z0000 Encounter for general adult medical examination without abnormal findings: Secondary | ICD-10-CM

## 2015-10-10 DIAGNOSIS — L209 Atopic dermatitis, unspecified: Secondary | ICD-10-CM | POA: Insufficient documentation

## 2015-10-10 MED ORDER — HYDROCORTISONE 2.5 % EX OINT: TOPICAL_OINTMENT | Freq: Two times a day (BID) | CUTANEOUS | Status: DC

## 2015-10-10 NOTE — Assessment & Plan Note (Signed)
Patient complains of a 2 month history of a pruritic rash on the right side of her neck. She denies history of rash. She denies fever or chills. She has not been wearing any new jewelry around her neck nor does she normally wear any necklaces. On exam, right side of neck is dry, hyperpigmented, scaling with a lichenified appearance. No evidence of vesicles or erythema. Rash does not appear fungal in nature. Most consistent with atopic dermatitis.  Plan: Hydrocortisone 2.5% cream BID to area (avoid face)

## 2015-10-10 NOTE — Patient Instructions (Signed)
CONTINUE CURRENT MEDICATIONS.  USE HYDROCORTISONE 2.5% CREAM OR OINTMENT ON YOUR NECK ONLY FOR THE RASH. YOU CAN GET THIS OVER THE COUNTER.  FOLLOW UP IN 3 MONTHS.   *PLEASE REMEMBER TO SCHEDULE YOUR MAMMOGRAM AND COMPLETE YOUR STOOL CARDS.*  FOLLOW UP WITH [facility].

## 2015-10-10 NOTE — Assessment & Plan Note (Addendum)
BP Readings from Last 3 Encounters:  10/10/15 153/73  09/05/15 191/100  07/26/14 131/66    Lab Results  Component Value Date   NA 146* 07/26/2014   K 3.5 07/26/2014   CREATININE 0.97 07/26/2014    Assessment: Blood pressure control:  Just above goal Progress toward BP goal:   Improved Comments: Previous BP was 191/100 as patient had run out of her lisinopril-HCTZ 20-25 mg daily before last visit. Medications were refilled and compliance was emphasized.   Plan: Medications:  Continue current medications.  Educational resources provided:   Self management tools provided:  Decreased sodium intake, increased exercise Other plans: Follow up in 3 months. If still elevated, consider increasing medication.

## 2015-10-10 NOTE — Assessment & Plan Note (Signed)
Encouraged patient to complete stool cards and to schedule mammogram.

## 2015-10-10 NOTE — Progress Notes (Signed)
Subjective:    Patient ID: Deborah [name], female    DOB: 1946/01/31, 70 y.o.   MRN: [id number]  HPI Deborah [name] is a 70 y.o. female with PMHx of HTN, Tobacco Abuse, Depression who presents to the clinic for follow up for HTN. Please see A&P for the status of the patient's chronic medical problems.   Past Medical History  Diagnosis Date  . Hypertension   . Hematuria   . Trichomonal vaginitis   . Asthma   . Anxiety   . Headache, chronic daily   . CVA (cerebral vascular accident) (HCC)   . S/P TAH-BSO   . Tobacco abuse   . Cocaine abuse   . Alcohol abuse, in remission   . Degenerative joint disease     back  . Fracture of hand   . Inadequate material resources   . Mental/behavioral problem     Outpatient Encounter Prescriptions as of 10/10/2015  Medication Sig  . aspirin 81 MG tablet Take 1 tablet (81 mg total) by mouth daily.  Marland Kitchen atorvastatin (LIPITOR) 40 MG tablet Take 1 tablet (40 mg total) by mouth daily.  Marland Kitchen FLUoxetine (PROZAC) 20 MG capsule Take 1 capsule (20 mg total) by mouth daily.  . hydrocortisone 2.5 % ointment Apply topically 2 (two) times daily.  Marland Kitchen lisinopril-hydrochlorothiazide (PRINZIDE,ZESTORETIC) 20-25 MG tablet Take 1 tablet by mouth daily.  . QUEtiapine (SEROQUEL XR) 50 MG TB24 24 hr tablet Take 2 tablets (100 mg total) by mouth at bedtime.  . [DISCONTINUED] methylPREDNISolone acetate (DEPO-MEDROL) injection 80 mg    No facility-administered encounter medications on file as of 10/10/2015.    Family History  Problem Relation Age of Onset  . Cancer Father     colon cancer  . Cancer Paternal Aunt     colon cancer    Social History   Social History  . Marital Status: Divorced    Spouse Name: N/A  . Number of Children: N/A  . Years of Education: N/A   Occupational History  . Not on file.   Social History Main Topics  . Smoking status: Former Smoker -- 0.10 packs/day    Types: Cigarettes    Quit date: 02/25/2011  . Smokeless tobacco: Current  User     Comment: quit x 2 months.  . Alcohol Use: No  . Drug Use: No  . Sexual Activity: Not on file   Other Topics Concern  . Not on file   Social History Narrative   Review of Systems General: Denies fatigue, change in appetite  Respiratory: Denies SOB, cough, DOE.   Cardiovascular: Denies chest pain and palpitations.  Gastrointestinal: Denies abdominal pain, diarrhea, constipation.  Musculoskeletal: Admits to arthralgia. Denies myalgias, back pain, joint swelling Skin: Admits to rash. Denies erythema. Neurological: Denies dizziness, headaches, weakness, lightheadedness Psychiatric/Behavioral: Denies mood changes, nervousness     Objective:   Physical Exam Filed Vitals:   10/10/15 1510  BP: 153/73  Pulse: 73  Temp: 98.6 F (37 C)  TempSrc: Oral  Height:  (1.626 m)  Weight: 158 lb 8 oz (71.895 kg)  SpO2: 100%   General: Vital signs reviewed.  Patient is well-developed and well-nourished, in no acute distress and cooperative with exam.  Cardiovascular: RRR, S1 normal, S2 normal, no murmurs, gallops, or rubs. Pulmonary/Chest: Clear to auscultation bilaterally, no wheezes, rales, or rhonchi. Abdominal: Soft, non-tender, non-distended, BS + Extremities: No lower extremity edema bilaterally Neurological: A&O  Skin: Dry, hyperpigmented, scaling rash on lateral right side of neck with  lichenification. No erythema. No vesicles. Psychiatric: Normal mood and affect. speech and behavior is normal. Cognition and memory are normal.      Assessment & Plan:   Please see problem based assessment and plan.

## 2015-10-10 NOTE — Assessment & Plan Note (Signed)
Patient was unable to obtain Seroquel due to the price and was previously able to receive these medications [facility] of her depression and medications.

## 2015-10-11 NOTE — Progress Notes (Signed)
Medicine attending: Medical history, presenting problems, physical findings, and medications, reviewed with resident physician Dr Alexa Burns on the day of the patient visit and I concur with her evaluation and management plan. 

## 2015-10-24 ENCOUNTER — Encounter: Payer: Self-pay | Admitting: *Deleted

## 2015-10-25 ENCOUNTER — Telehealth: Payer: Self-pay | Admitting: *Deleted

## 2015-10-25 NOTE — Telephone Encounter (Signed)
Patient arrived stating she had lost her [facility] information, hemoccult stool card, and prescription from last visit. Replacement stool card and [facility] information given and informed patient that prescription for hydrocortisone cream was called into her pharmacy.

## 2016-01-09 ENCOUNTER — Encounter: Payer: Medicare Other | Admitting: Internal Medicine

## 2016-01-30 ENCOUNTER — Encounter: Payer: Medicare Other | Admitting: Internal Medicine

## 2016-02-26 ENCOUNTER — Telehealth: Payer: Self-pay | Admitting: Internal Medicine

## 2016-02-26 NOTE — Telephone Encounter (Signed)
APT. REMINDER CALL, NO ANSWER, NO VOICEMAIL °

## 2016-02-27 ENCOUNTER — Encounter: Payer: Self-pay | Admitting: Internal Medicine

## 2016-02-27 ENCOUNTER — Encounter: Payer: Medicare Other | Admitting: Internal Medicine

## 2016-03-04 ENCOUNTER — Ambulatory Visit (INDEPENDENT_AMBULATORY_CARE_PROVIDER_SITE_OTHER): Payer: Medicare Other | Admitting: Internal Medicine

## 2016-03-04 ENCOUNTER — Encounter: Payer: Self-pay | Admitting: Internal Medicine

## 2016-03-04 DIAGNOSIS — Z9114 Patient's other noncompliance with medication regimen: Secondary | ICD-10-CM | POA: Diagnosis not present

## 2016-03-04 DIAGNOSIS — I10 Essential (primary) hypertension: Secondary | ICD-10-CM | POA: Diagnosis not present

## 2016-03-04 DIAGNOSIS — M545 Low back pain, unspecified: Secondary | ICD-10-CM | POA: Insufficient documentation

## 2016-03-04 DIAGNOSIS — F329 Major depressive disorder, single episode, unspecified: Secondary | ICD-10-CM

## 2016-03-04 DIAGNOSIS — F32A Depression, unspecified: Secondary | ICD-10-CM

## 2016-03-04 DIAGNOSIS — I6350 Cerebral infarction due to unspecified occlusion or stenosis of unspecified cerebral artery: Secondary | ICD-10-CM

## 2016-03-04 DIAGNOSIS — Z8673 Personal history of transient ischemic attack (TIA), and cerebral infarction without residual deficits: Secondary | ICD-10-CM

## 2016-03-04 DIAGNOSIS — Z87891 Personal history of nicotine dependence: Secondary | ICD-10-CM

## 2016-03-04 DIAGNOSIS — Z0000 Encounter for general adult medical examination without abnormal findings: Secondary | ICD-10-CM

## 2016-03-04 MED ORDER — QUETIAPINE FUMARATE 50 MG PO TABS: 50.0000 mg | ORAL_TABLET | Freq: Two times a day (BID) | ORAL | 3 refills | Status: DC

## 2016-03-04 MED ORDER — IBUPROFEN 400 MG PO TABS: 400.0000 mg | ORAL_TABLET | Freq: Four times a day (QID) | ORAL | 2 refills | Status: AC | PRN

## 2016-03-04 MED ORDER — FLUOXETINE HCL 20 MG PO CAPS: 20.0000 mg | ORAL_CAPSULE | Freq: Every day | ORAL | 3 refills | Status: DC

## 2016-03-04 MED ORDER — LISINOPRIL-HYDROCHLOROTHIAZIDE 20-25 MG PO TABS: 1.0000 | ORAL_TABLET | Freq: Every day | ORAL | 3 refills | Status: DC

## 2016-03-04 MED ORDER — ATORVASTATIN CALCIUM 40 MG PO TABS: 40.0000 mg | ORAL_TABLET | Freq: Every day | ORAL | 3 refills | Status: DC

## 2016-03-04 MED ORDER — ASPIRIN 81 MG PO TABS: 81.0000 mg | ORAL_TABLET | Freq: Every day | ORAL | 11 refills | Status: DC

## 2016-03-04 NOTE — Patient Instructions (Addendum)
It was pleasure taking care of you today. Please restart taking her medicine. Take ibuprofen as needed for your pain. I am changing your Seroquel to a regular one, so you can get it with your insurance. That is the same medicine you were taking before but now you have to take it 2 times a day instead of just once. Please follow-up in about a month for your blood pressure checkup.

## 2016-03-04 NOTE — Progress Notes (Signed)
   CC: Lower backache, and not feeling well.  HPI:  Deborah [name] is a 70 y.o.with PMH as listed below, presented to the clinic today with complaint of lower back pain for 2 weeks. Pain is non radiating, increased with walking and movement and ease off on laying down. She denies any fever, chills, increased in frequency, urgency or dysuria. She also denies any tingling, numbness or any focal weakness. She also complained of intermittent generalized itching of her upper extremities. She also complained of intermittent burning pain in her feet. She stopped taking all her medicines for about a month stating that the [facility] makes her feel really bad and she don't want to take any medicine. On further questioning she admits feeling better with Prozac and Seroquel. But she states that [facility], but she don't remember the name and did not bring any medicine with her. She is willing  to restart her medicines if we prescribed her the same Prozac and Seroquel along with her blood pressure medicine Past Medical History:  Diagnosis Date  . Alcohol abuse, in remission   . Anxiety   . Asthma   . Cocaine abuse   . CVA (cerebral vascular accident) (HCC)   . Degenerative joint disease    back  . Fracture of hand   . Headache, chronic daily   . Hematuria   . Hypertension   . Inadequate material resources   . Mental/behavioral problem   . S/P TAH-BSO   . Tobacco abuse   . Trichomonal vaginitis     Review of Systems: As per HPI.  Physical Exam:  Vitals:   03/04/16 0901  BP: (!) 183/98  Pulse: 91  Temp: 98.1 F (36.7 C)  TempSrc: Oral  SpO2: 100%  Weight: 146 lb 3.2 oz (66.3 kg)   Gen. lean anxious looking lady, no acute distress. Skin. Dry, some hyperpigmentation without any excoriation marks. No erythema. Musculoskeletal. Tenderness along the lumbar spinal and paraspinal region. No erythema, no edema and no change in  temperature. Straight leg raise causes pain in her back. Lungs. Clear bilaterally. CV. RRR. No r/m/g Abdomen. Soft, nontender, bowel sounds positive Neuro. Alert and oriented 3. Strength 5/5 bilaterally. Sensations intact.. No focal deficit.   Assessment & Plan:   See Encounters Tab for problem based charting.  Patient seen with Dr. Josem KaufmannKlima

## 2016-03-04 NOTE — Assessment & Plan Note (Signed)
She presented to the clinic today with complaint of lower back pain for 2 weeks. Pain is non radiating, increased with walking and movement and ease off on laying down. She denies any fever, chills, increased in frequency, urgency or dysuria. She also denies any tingling, numbness or any focal weakness.  Her exam shows Tenderness along the lumbar spinal and paraspinal region. No erythema, no edema and no change in temperature. Straight leg raise causes pain in her back.  I think stopping her depression medicine is also responsible for her generalized aches and pain and not feeling well. Plan. -Ibuprofen 400 mg every 6 hourly for pain as needed. -Restart her Prozac and Seroquel.

## 2016-03-04 NOTE — Assessment & Plan Note (Signed)
She has well-controlled symptoms on Prozac 20 mg and Seroquel ER 100 mg daily. She stopped taking all her medicines for about a month stating that [facility] her feel really bad and she don't want to take any medicine. She is willing  to restart her medicines if we prescribed her the same Prozac and Seroquel. She also states that she cannot afford the cost of Seroquel.  -We talked with her insurance and they told us that they will only cover regular Seroquel not the ER one. We changed her Seroquel to regular 50 mg twice a day and represcribed her Prozac 20 mg daily.

## 2016-03-04 NOTE — Assessment & Plan Note (Signed)
BP Readings from Last 3 Encounters:  03/04/16 (!) 183/98  10/10/15 (!) 153/73  09/05/15 (!) 191/100   She was not taking her medicine for about a month now. Plan. We discussed about taking her medicine on a regular basis. We sent in a new prescription for the same dose of lisinopril- hydrochlorothiazide. Reassess during her next follow-up visit about her compliance.

## 2016-03-04 NOTE — Progress Notes (Signed)
I saw and evaluated the patient.  I personally confirmed the key portions of Dr. Amin's history and exam and reviewed pertinent patient test results.  The assessment, diagnosis, and plan were formulated together and I agree with the documentation in the resident's note. 

## 2016-03-04 NOTE — Assessment & Plan Note (Signed)
She was given a flu shot today. 

## 2016-04-09 ENCOUNTER — Encounter: Payer: Medicare Other | Admitting: Internal Medicine

## 2016-04-09 ENCOUNTER — Encounter: Payer: Self-pay | Admitting: Internal Medicine

## 2016-04-30 ENCOUNTER — Encounter: Payer: Self-pay | Admitting: Internal Medicine

## 2016-04-30 ENCOUNTER — Ambulatory Visit (INDEPENDENT_AMBULATORY_CARE_PROVIDER_SITE_OTHER): Payer: Medicare Other | Admitting: Internal Medicine

## 2016-04-30 VITALS — BP 128/71 | HR 62 | Temp 98.3°F | Ht 64.0 in | Wt 146.4 lb

## 2016-04-30 DIAGNOSIS — F329 Major depressive disorder, single episode, unspecified: Secondary | ICD-10-CM | POA: Diagnosis not present

## 2016-04-30 DIAGNOSIS — Z79899 Other long term (current) drug therapy: Secondary | ICD-10-CM | POA: Diagnosis not present

## 2016-04-30 DIAGNOSIS — Z87891 Personal history of nicotine dependence: Secondary | ICD-10-CM

## 2016-04-30 DIAGNOSIS — Z1231 Encounter for screening mammogram for malignant neoplasm of breast: Secondary | ICD-10-CM

## 2016-04-30 DIAGNOSIS — Z0000 Encounter for general adult medical examination without abnormal findings: Secondary | ICD-10-CM

## 2016-04-30 DIAGNOSIS — R634 Abnormal weight loss: Secondary | ICD-10-CM | POA: Insufficient documentation

## 2016-04-30 DIAGNOSIS — F32A Depression, unspecified: Secondary | ICD-10-CM

## 2016-04-30 DIAGNOSIS — I10 Essential (primary) hypertension: Secondary | ICD-10-CM

## 2016-04-30 DIAGNOSIS — Z1211 Encounter for screening for malignant neoplasm of colon: Secondary | ICD-10-CM

## 2016-04-30 NOTE — Assessment & Plan Note (Signed)
Mammogram: Ordered Colon Cancer Screening: Given stool cards Flu vaccine: Received 2017 Pap Smear: s/p TAH/BSO

## 2016-04-30 NOTE — Assessment & Plan Note (Addendum)
Patient complains of poor appetite for the last 2 months. She has had a notable weight gain from 2012 to 2016 then weight loss from 2015 (174 lbs) to now (146 lbs), but has been stable over the last 3 months. She admits to night sweats. She denies fever, chills, dysphagia, abdominal pain, nausea, vomiting, early satiety, bloating, blood in her stool or diarrhea. No lymphadenopathy on exam. Poor appetite and weight loss may be correlated to depression and/or being off of her antidepressants (Seroquel and Prozac). We will do a work up for underlying malignancy in the meantime.   Plan: -CBC -CMET -Hepatitis C ab -HIV -Stool cards -Mammogram -TSH

## 2016-04-30 NOTE — Assessment & Plan Note (Signed)
BP Readings from Last 3 Encounters:  04/30/16 128/71  03/04/16 (!) 183/98  10/10/15 (!) 153/73    Lab Results  Component Value Date   NA 146 (H) 07/26/2014   K 3.5 07/26/2014   CREATININE 0.97 07/26/2014    Assessment: Blood pressure control:  Controlled Progress toward BP goal:   At goal Comments: Compliant with lisinopril-HCTZ 20-25 mg QD.  Plan: Medications:  continue current medications Other plans: Follow up in 3 months.

## 2016-04-30 NOTE — Assessment & Plan Note (Signed)
Patient states one of her medications- either the Seroquel of Prozac- makes her feel funny. She is unable to describe this further or to state which one makes her feel this way. She is not taking either medication. Patient [facility] as she feels they do not prescribe her the right medications.   Plan: -Patient to call the clinic with which medication gives her side effects

## 2016-04-30 NOTE — Patient Instructions (Signed)
PLEASE RETURN TO THE CLINIC TO COMPLETE YOUR LAB WORK. YOU DO NOT HAVE TO SEE ME, JUST STOP BY TO HAVE IT CHECKED.  CONTINUE TAKING ALL OF YOUR MEDICATIONS. PLEASE CALL ME WITH WHICH MEDICATIONS (SEROQUEL- QUETIAPINE AND FLUOXETINE- PROZAC) THAT YOU ARE TAKING AND WHICH ONES YOU LIKE OR DON'T LIKE.   PLEASE COMPLETE YOUR MAMMOGRAM AND STOOL CARDS.

## 2016-04-30 NOTE — Progress Notes (Signed)
    CC: Follow up for HTN  HPI: Deborah [name] is a 70 y.o. female with PMHx of HTN, DJD, atopic dermatitis, asthma, depression, h/o CVA, and OA who presents to the clinic for follow up for HTN. Please see problem oriented assessment and plan.  Patient denies chest pain and shortness of breath. She admits to decreased appetite and weight loss.   Past Medical History:  Diagnosis Date  . Alcohol abuse, in remission   . Anxiety   . Asthma   . Cocaine abuse   . CVA (cerebral vascular accident) (HCC)   . Degenerative joint disease    back  . Fracture of hand   . Headache, chronic daily   . Hematuria   . Hypertension   . Inadequate material resources   . Mental/behavioral problem   . S/P TAH-BSO   . Tobacco abuse   . Trichomonal vaginitis     Review of Systems: Please see pertinent ROS reviewed in HPI and problem based charting.   Physical Exam: Vitals:   04/30/16 1350  BP: (!) 149/77  Pulse: 70  Temp: 98.3 F (36.8 C)  TempSrc: Oral  SpO2: 100%  Weight: 146 lb 6.4 oz (66.4 kg)  Height: 5\' 4"  (1.626 m)   General: Vital signs reviewed.  Patient is chronically ill appearing, but younger than stated age, in no acute distress and cooperative with exam.  Head: Normocephalic and atraumatic. Eyes: PERRL, conjunctivae normal, no scleral icterus.  Neck: Supple, trachea midline, no carotid bruit present. No cervical lymphadenopathy. Cardiovascular: RRR, S1 normal, S2 normal, no murmurs, gallops, or rubs. Pulmonary/Chest: Clear to auscultation bilaterally, no wheezes, rales, or rhonchi. Abdominal: Soft, non-tender, non-distended, BS + Extremities: No lower extremity edema bilaterally  Assessment & Plan:  See encounters tab for problem based medical decision making. Patient discussed with Dr. Criselda PeachesMullen

## 2016-05-01 ENCOUNTER — Ambulatory Visit: Payer: Medicare Other | Admitting: Podiatry

## 2016-05-02 NOTE — Progress Notes (Signed)
Internal Medicine Clinic Attending  Case discussed with Dr. Burns soon after the resident saw the patient.  We reviewed the resident's history and exam and pertinent patient test results.  I agree with the assessment, diagnosis, and plan of care documented in the resident's note. 

## 2016-05-09 ENCOUNTER — Encounter: Payer: Medicare Other | Admitting: Podiatry

## 2016-05-09 NOTE — Progress Notes (Signed)
This encounter was created in error - please disregard.

## 2016-05-19 ENCOUNTER — Other Ambulatory Visit: Payer: Medicare Other

## 2016-05-19 DIAGNOSIS — Z1211 Encounter for screening for malignant neoplasm of colon: Secondary | ICD-10-CM

## 2016-05-20 ENCOUNTER — Ambulatory Visit
Admission: RE | Admit: 2016-05-20 | Discharge: 2016-05-20 | Disposition: A | Discharge status: Home / Self Care | Payer: Medicare Other | Source: Ambulatory Visit | Attending: Internal Medicine | Admitting: Internal Medicine

## 2016-05-20 DIAGNOSIS — Z1231 Encounter for screening mammogram for malignant neoplasm of breast: Secondary | ICD-10-CM

## 2016-05-22 NOTE — Progress Notes (Signed)
This encounter was created in error - please disregard.

## 2016-06-27 ENCOUNTER — Ambulatory Visit: Payer: Medicare Other

## 2016-06-27 ENCOUNTER — Encounter: Payer: Self-pay | Admitting: Internal Medicine

## 2016-07-02 ENCOUNTER — Encounter: Payer: Self-pay | Admitting: Internal Medicine

## 2016-08-07 ENCOUNTER — Other Ambulatory Visit: Payer: Self-pay | Admitting: Internal Medicine

## 2016-10-23 ENCOUNTER — Ambulatory Visit: Payer: Medicare Other | Admitting: Nurse Practitioner

## 2017-03-06 DIAGNOSIS — J4520 Mild intermittent asthma, uncomplicated: Secondary | ICD-10-CM | POA: Diagnosis not present

## 2017-03-06 DIAGNOSIS — M1990 Unspecified osteoarthritis, unspecified site: Secondary | ICD-10-CM | POA: Diagnosis not present

## 2017-03-06 DIAGNOSIS — R7309 Other abnormal glucose: Secondary | ICD-10-CM | POA: Diagnosis not present

## 2017-03-06 DIAGNOSIS — Z13220 Encounter for screening for lipoid disorders: Secondary | ICD-10-CM | POA: Diagnosis not present

## 2017-03-06 DIAGNOSIS — Z1329 Encounter for screening for other suspected endocrine disorder: Secondary | ICD-10-CM | POA: Diagnosis not present

## 2017-03-06 DIAGNOSIS — Z130 Encounter for screening for diseases of the blood and blood-forming organs and certain disorders involving the immune mechanism: Secondary | ICD-10-CM | POA: Diagnosis not present

## 2017-03-06 DIAGNOSIS — I10 Essential (primary) hypertension: Secondary | ICD-10-CM | POA: Diagnosis not present

## 2017-07-06 ENCOUNTER — Observation Stay (HOSPITAL_COMMUNITY)
Admission: EM | Admit: 2017-07-06 | Discharge: 2017-07-06 | Discharge status: Against Medical Advice | Payer: Medicare Other | Attending: Internal Medicine | Admitting: Internal Medicine

## 2017-07-06 ENCOUNTER — Observation Stay (HOSPITAL_BASED_OUTPATIENT_CLINIC_OR_DEPARTMENT_OTHER): Payer: Medicare Other

## 2017-07-06 ENCOUNTER — Encounter (HOSPITAL_COMMUNITY): Payer: Self-pay | Admitting: Emergency Medicine

## 2017-07-06 ENCOUNTER — Emergency Department (HOSPITAL_COMMUNITY): Payer: Medicare Other

## 2017-07-06 DIAGNOSIS — F1410 Cocaine abuse, uncomplicated: Secondary | ICD-10-CM | POA: Diagnosis not present

## 2017-07-06 DIAGNOSIS — M170 Bilateral primary osteoarthritis of knee: Secondary | ICD-10-CM | POA: Diagnosis not present

## 2017-07-06 DIAGNOSIS — R4020 Unspecified coma: Secondary | ICD-10-CM | POA: Diagnosis present

## 2017-07-06 DIAGNOSIS — E876 Hypokalemia: Secondary | ICD-10-CM | POA: Diagnosis not present

## 2017-07-06 DIAGNOSIS — F329 Major depressive disorder, single episode, unspecified: Secondary | ICD-10-CM | POA: Diagnosis not present

## 2017-07-06 DIAGNOSIS — R51 Headache: Secondary | ICD-10-CM

## 2017-07-06 DIAGNOSIS — E119 Type 2 diabetes mellitus without complications: Secondary | ICD-10-CM | POA: Insufficient documentation

## 2017-07-06 DIAGNOSIS — F1011 Alcohol abuse, in remission: Secondary | ICD-10-CM | POA: Diagnosis not present

## 2017-07-06 DIAGNOSIS — R10819 Abdominal tenderness, unspecified site: Secondary | ICD-10-CM

## 2017-07-06 DIAGNOSIS — F419 Anxiety disorder, unspecified: Secondary | ICD-10-CM | POA: Insufficient documentation

## 2017-07-06 DIAGNOSIS — Z79899 Other long term (current) drug therapy: Secondary | ICD-10-CM | POA: Diagnosis not present

## 2017-07-06 DIAGNOSIS — I10 Essential (primary) hypertension: Secondary | ICD-10-CM | POA: Diagnosis present

## 2017-07-06 DIAGNOSIS — Z8673 Personal history of transient ischemic attack (TIA), and cerebral infarction without residual deficits: Secondary | ICD-10-CM | POA: Insufficient documentation

## 2017-07-06 DIAGNOSIS — Z90722 Acquired absence of ovaries, bilateral: Secondary | ICD-10-CM | POA: Diagnosis not present

## 2017-07-06 DIAGNOSIS — Z9079 Acquired absence of other genital organ(s): Secondary | ICD-10-CM | POA: Insufficient documentation

## 2017-07-06 DIAGNOSIS — Z90710 Acquired absence of both cervix and uterus: Secondary | ICD-10-CM | POA: Insufficient documentation

## 2017-07-06 DIAGNOSIS — R8271 Bacteriuria: Secondary | ICD-10-CM | POA: Insufficient documentation

## 2017-07-06 DIAGNOSIS — R5383 Other fatigue: Secondary | ICD-10-CM | POA: Diagnosis not present

## 2017-07-06 DIAGNOSIS — R002 Palpitations: Secondary | ICD-10-CM | POA: Diagnosis not present

## 2017-07-06 DIAGNOSIS — R55 Syncope and collapse: Secondary | ICD-10-CM

## 2017-07-06 DIAGNOSIS — Z7982 Long term (current) use of aspirin: Secondary | ICD-10-CM | POA: Insufficient documentation

## 2017-07-06 DIAGNOSIS — Z87891 Personal history of nicotine dependence: Secondary | ICD-10-CM | POA: Diagnosis not present

## 2017-07-06 DIAGNOSIS — R402441 Other coma, without documented Glasgow coma scale score, or with partial score reported, in the field [EMT or ambulance]: Secondary | ICD-10-CM | POA: Diagnosis not present

## 2017-07-06 LAB — COMPREHENSIVE METABOLIC PANEL
ALT: 20 U/L (ref 14–54)
AST: 28 U/L (ref 15–41)
Albumin: 3.7 g/dL (ref 3.5–5.0)
Alkaline Phosphatase: 84 U/L (ref 38–126)
Anion gap: 13 (ref 5–15)
BILIRUBIN TOTAL: 0.6 mg/dL (ref 0.3–1.2)
BUN: 10 mg/dL (ref 6–20)
CO2: 21 mmol/L — ABNORMAL LOW (ref 22–32)
CREATININE: 0.97 mg/dL (ref 0.44–1.00)
Calcium: 8.7 mg/dL — ABNORMAL LOW (ref 8.9–10.3)
Chloride: 97 mmol/L — ABNORMAL LOW (ref 101–111)
GFR calc Af Amer: >60 mL/min (ref >60)
GFR, EST NON AFRICAN AMERICAN: 57 mL/min — AB (ref >60)
GLUCOSE: 112 mg/dL — AB (ref 65–99)
Potassium: 2.9 mmol/L — ABNORMAL LOW (ref 3.5–5.1)
Sodium: 131 mmol/L — ABNORMAL LOW (ref 135–145)
Total Protein: 6.7 g/dL (ref 6.5–8.1)

## 2017-07-06 LAB — RAPID URINE DRUG SCREEN, HOSP PERFORMED
Amphetamines: NOT DETECTED
BARBITURATES: NOT DETECTED
Benzodiazepines: NOT DETECTED
Cocaine: POSITIVE — AB
Opiates: NOT DETECTED
TETRAHYDROCANNABINOL: NOT DETECTED

## 2017-07-06 LAB — TROPONIN I: Troponin I: <0.03 ng/mL (ref <0.03)

## 2017-07-06 LAB — URINALYSIS, ROUTINE W REFLEX MICROSCOPIC
Bilirubin Urine: NEGATIVE
Glucose, UA: NEGATIVE mg/dL
Ketones, ur: NEGATIVE mg/dL
Nitrite: POSITIVE — AB
Protein, ur: 30 mg/dL — AB
SPECIFIC GRAVITY, URINE: 1.014 (ref 1.005–1.030)
pH: 6 (ref 5.0–8.0)

## 2017-07-06 LAB — CBC WITH DIFFERENTIAL/PLATELET
BASOS ABS: 0 10*3/uL (ref 0.0–0.1)
Basophils Relative: 0 %
Eosinophils Absolute: 0 10*3/uL (ref 0.0–0.7)
Eosinophils Relative: 0 %
HEMATOCRIT: 37.8 % (ref 36.0–46.0)
Hemoglobin: 12.7 g/dL (ref 12.0–15.0)
LYMPHS PCT: 11 %
Lymphs Abs: 0.5 10*3/uL — ABNORMAL LOW (ref 0.7–4.0)
MCH: 30.9 pg (ref 26.0–34.0)
MCHC: 33.6 g/dL (ref 30.0–36.0)
MCV: 92 fL (ref 78.0–100.0)
Monocytes Absolute: 0.3 10*3/uL (ref 0.1–1.0)
Monocytes Relative: 6 %
NEUTROS ABS: 4 10*3/uL (ref 1.7–7.7)
Neutrophils Relative %: 83 %
PLATELETS: 279 10*3/uL (ref 150–400)
RBC: 4.11 MIL/uL (ref 3.87–5.11)
RDW: 13.9 % (ref 11.5–15.5)
WBC: 4.8 10*3/uL (ref 4.0–10.5)

## 2017-07-06 LAB — ECHOCARDIOGRAM COMPLETE
E decel time: 250 msec
E/e' ratio: 12.33
FS: 42 % (ref 28–44)
IVS/LV PW RATIO, ED: 0.92
LA ID, A-P, ES: 32 mm
LA diam end sys: 32 mm
LA diam index: 1.87 cm/m2
LA vol A4C: 44.2 ml
LA vol index: 27 mL/m2
LA vol: 46.2 mL
LV E/e' medial: 12.33
LV E/e'average: 12.33
LV PW d: 9.24 mm — AB (ref 0.6–1.1)
LV e' LATERAL: 6.74 cm/s
LVOT SV: 106 mL
LVOT VTI: 30.6 cm
LVOT area: 3.46 cm2
LVOT diameter: 21 mm
LVOT peak grad rest: 14 mmHg
LVOT peak vel: 185 cm/s
Lateral S' vel: 13.5 cm/s
MV Dec: 250
MV Peak grad: 3 mmHg
MV pk A vel: 120 m/s
MV pk E vel: 83.1 m/s
TAPSE: 18.1 mm
TDI e' lateral: 6.74
TDI e' medial: 6.09

## 2017-07-06 LAB — TSH: TSH: 2.189 u[IU]/mL (ref 0.350–4.500)

## 2017-07-06 LAB — CBG MONITORING, ED: GLUCOSE-CAPILLARY: 76 mg/dL (ref 65–99)

## 2017-07-06 LAB — AMMONIA: AMMONIA: 43 umol/L — AB (ref 9–35)

## 2017-07-06 LAB — SALICYLATE LEVEL

## 2017-07-06 LAB — ACETAMINOPHEN LEVEL

## 2017-07-06 LAB — MAGNESIUM: Magnesium: 1.8 mg/dL (ref 1.7–2.4)

## 2017-07-06 LAB — ETHANOL

## 2017-07-06 MED ORDER — ENOXAPARIN SODIUM 40 MG/0.4ML ~~LOC~~ SOLN: 40.0000 mg | SUBCUTANEOUS | Status: DC

## 2017-07-06 MED ORDER — ASPIRIN EC 325 MG PO TBEC
325.0000 mg | DELAYED_RELEASE_TABLET | Freq: Every day | ORAL | Status: DC
  Administered 2017-07-06: 325 mg via ORAL
  Filled 2017-07-06: qty 1

## 2017-07-06 MED ORDER — ACETAMINOPHEN 650 MG RE SUPP: 650.0000 mg | Freq: Four times a day (QID) | RECTAL | Status: DC | PRN

## 2017-07-06 MED ORDER — ACETAMINOPHEN 325 MG PO TABS
650.0000 mg | ORAL_TABLET | Freq: Four times a day (QID) | ORAL | Status: DC | PRN
  Administered 2017-07-06: 650 mg via ORAL
  Filled 2017-07-06: qty 2

## 2017-07-06 MED ORDER — DEXTROSE 5 % IV SOLN
1.0000 g | Freq: Once | INTRAVENOUS | Status: AC
  Administered 2017-07-06: 1 g via INTRAVENOUS
  Filled 2017-07-06: qty 10

## 2017-07-06 MED ORDER — POTASSIUM CHLORIDE CRYS ER 20 MEQ PO TBCR
40.0000 meq | EXTENDED_RELEASE_TABLET | Freq: Two times a day (BID) | ORAL | Status: DC
  Administered 2017-07-06: 40 meq via ORAL
  Filled 2017-07-06: qty 2

## 2017-07-06 NOTE — ED Notes (Signed)
MD talking to patient at Nurses station about staying in the hospital.

## 2017-07-06 NOTE — ED Notes (Signed)
Internal medicine paged to RN Windell MouldingRuth per her request

## 2017-07-06 NOTE — ED Notes (Signed)
Pt wanted to leave and was given information by MD and RN about risks of leaving. Pt still wanted to leave. Pt signed AMA form. VS stable and pt aware of what was going on.

## 2017-07-06 NOTE — ED Notes (Signed)
Consulting MD at bedside

## 2017-07-06 NOTE — ED Notes (Signed)
Heart healthy lunch tray ordered @ 1050 

## 2017-07-06 NOTE — ED Notes (Signed)
Pt voided in the bed pan.

## 2017-07-06 NOTE — Progress Notes (Signed)
  Echocardiogram 2D Echocardiogram has been performed.  Delcie RochENNINGTON, Estalee Mccandlish 07/06/2017, 11:42 AM

## 2017-07-06 NOTE — ED Notes (Addendum)
Patient states "I feel better and I am going to go home". When RN walked in the room patient is dressed and putting on her shoes. RN asked patient how she is getting home she states I will catch the bus. RN paged admitting MD

## 2017-07-06 NOTE — ED Triage Notes (Addendum)
Pt presents to ER from home with [facility] for AMS/LOC lasting approx 10 mins per bystanders on scene; pt was sitting in common area acting normally for up to 2 hr prior to this event; EMS arrived on scene to find patient slumped in chair, diaphoretic, HR 56, BP 98/68, RR 12, 100%, CBG 118; EMS inserted NPA and patient became alert, GCS 15; vital signs improved to HR 80 regular, BP 140/76, 97%, RR20; pupils initially 3 and now pinpoint; track marks noted on upper extremities, known history of drug abuse EMS also noted that pt reported fall couple days ago with headache presently, no other focal deficits noted

## 2017-07-06 NOTE — ED Provider Notes (Signed)
[facility] EMERGENCY DEPARTMENT Provider Note   CSN: [id number] Arrival date & time: 07/06/17  0300     History   Chief Complaint Chief Complaint  Patient presents with  . Loss of Consciousness    HPI chart review shows patient with Deborah [name] is a 72 y.o. female.  Patient presents from her apartment complex with apparent loss of consciousness.  By EMS report she was acting normally while speaking with people in the common area.  On EMS arrival she was found slumped in a chair and diaphoretic.  CBG was 118.  EMS was inserted nasal airway and patient became more alert.  She is now awake and alert.  She is oriented to person and place only.  She does not recall what happened.  No reported seizure activity.  No tongue biting or bowel or bladder incontinence.  Patient denies any chronic medical conditions or medications.  She is a diabetic however she states she is not on medications for it.  No recent illnesses with fever, chills, vomiting, nausea, chest pain shortness of breath or cough.  No focal neurological deficits.  Chart review shows patient with history of alcohol abuse and drug use in the past.  She denies any of this recently.   The history is provided by the patient.  Loss of Consciousness   Associated symptoms include weakness. Pertinent negatives include abdominal pain, chest pain, dizziness, light-headedness, seizures and vomiting.    Past Medical History:  Diagnosis Date  . Alcohol abuse, in remission   . Anxiety   . Asthma   . Cocaine abuse (HCC)   . CVA (cerebral vascular accident) (HCC)   . Degenerative joint disease    back  . Fracture of hand   . Headache, chronic daily   . Hematuria   . Hypertension   . Inadequate material resources   . Mental/behavioral problem   . S/P TAH-BSO   . Tobacco abuse   . Trichomonal vaginitis     Patient Active Problem List   Diagnosis Date Noted  . Loss of weight 04/30/2016  . Lower back pain  03/04/2016  . Atopic dermatitis 10/10/2015  . History of CVA (cerebrovascular accident) 09/05/2015  . Health maintenance examination 10/29/2011  . Primary osteoarthritis of both knees 02/28/2011  . Depression 08/10/2009  . DJD (degenerative joint disease) 07/03/2006  . Essential hypertension 06/08/2006  . ASTHMA 06/08/2006  . TAH/BSO, HX OF 06/08/2006    Past Surgical History:  Procedure Laterality Date  . ABDOMINAL HYSTERECTOMY  04/13/1989   Per Dr. Lilian Kapur. Abdominal hysterectomy and bilateral salpingo-oophorectomy    OB History    No data available       Home Medications    Prior to Admission medications   Medication Sig Start Date End Date Taking? Authorizing Provider  aspirin 81 MG tablet Take 1 tablet (81 mg total) by mouth daily. 03/04/16   Arnetha Courser, MD  atorvastatin (LIPITOR) 40 MG tablet Take 1 tablet (40 mg total) by mouth daily. 03/04/16   Arnetha Courser, MD  FLUoxetine (PROZAC) 20 MG capsule Take 1 capsule (20 mg total) by mouth daily. 03/04/16   Arnetha Courser, MD  hydrocortisone 2.5 % ointment Apply topically 2 (two) times daily. 10/10/15   Burns, Tinnie Gens, MD  lisinopril-hydrochlorothiazide (PRINZIDE,ZESTORETIC) 20-25 MG tablet Take 1 tablet by mouth daily. 03/04/16   Arnetha Courser, MD  QUEtiapine (SEROQUEL) 50 MG tablet Take 1 tablet (50 mg total) by mouth 2 (two) times daily. 03/04/16  Arnetha CourserAmin, Sumayya, MD    Family History Family History  Problem Relation Age of Onset  . Cancer Father        colon cancer  . Cancer Paternal Aunt        colon cancer    Social History Social History   Tobacco Use  . Smoking status: Former Smoker    Packs/day: 0.10    Types: Cigarettes    Last attempt to quit: 02/25/2011    Years since quitting: 6.3  . Smokeless tobacco: Former NeurosurgeonUser  . Tobacco comment: quit x 2 months.  Substance Use Topics  . Alcohol use: No    Alcohol/week: 0.0 oz  . Drug use: No     Allergies   Patient has no known allergies.   Review of  Systems Review of Systems  Constitutional: Positive for activity change, appetite change and fatigue.  Respiratory: Negative for cough and shortness of breath.   Cardiovascular: Positive for syncope. Negative for chest pain.  Gastrointestinal: Negative for abdominal pain and vomiting.  Genitourinary: Negative for dysuria, hematuria, vaginal bleeding and vaginal discharge.  Musculoskeletal: Negative for arthralgias and myalgias.  Neurological: Positive for syncope and weakness. Negative for dizziness, seizures, speech difficulty and light-headedness.   all other systems are negative except as noted in the HPI and PMH.     Physical Exam Updated Vital Signs BP (!) 147/91 (BP Location: Right Arm)   Pulse 70   Temp 99.5 F (37.5 C) (Temporal)   Resp 18   SpO2 99%   Physical Exam  Constitutional: She is oriented to person, place, and time. She appears well-developed and well-nourished. No distress.  Fatigued appearing, oriented to person and place only  HENT:  Head: Normocephalic and atraumatic.  Mouth/Throat: Oropharynx is clear and moist. No oropharyngeal exudate.  Eyes: Conjunctivae and EOM are normal. Pupils are equal, round, and reactive to light.  Neck: Normal range of motion. Neck supple.  No meningismus.  Cardiovascular: Normal rate, regular rhythm, normal heart sounds and intact distal pulses.  No murmur heard. Pulmonary/Chest: Effort normal and breath sounds normal. No respiratory distress.  Abdominal: Soft. There is no tenderness. There is no rebound and no guarding.  Musculoskeletal: Normal range of motion. She exhibits no edema or tenderness.  Neurological: She is alert and oriented to person, place, and time. No cranial nerve deficit. She exhibits normal muscle tone. Coordination normal.  CN 2-12 intact, no ataxia on finger to nose, no nystagmus, 5/5 strength throughout, no pronator drift, Romberg negative, normal gait.   Skin: Skin is warm. Capillary refill takes less  than 2 seconds.  Psychiatric: She has a normal mood and affect. Her behavior is normal.  Nursing note and vitals reviewed.    ED Treatments / Results  Labs (all labs ordered are listed, but only abnormal results are displayed) Labs Reviewed  CBC WITH DIFFERENTIAL/PLATELET - Abnormal; Notable for the following components:      Result Value   Lymphs Abs 0.5 (*)    All other components within normal limits  COMPREHENSIVE METABOLIC PANEL - Abnormal; Notable for the following components:   Sodium 131 (*)    Potassium 2.9 (*)    Chloride 97 (*)    CO2 21 (*)    Glucose, Bld 112 (*)    Calcium 8.7 (*)    GFR calc non Af Amer 57 (*)    All other components within normal limits  RAPID URINE DRUG SCREEN, HOSP PERFORMED - Abnormal; Notable for the following components:  Cocaine POSITIVE (*)    All other components within normal limits  ACETAMINOPHEN LEVEL - Abnormal; Notable for the following components:   Acetaminophen (Tylenol), Serum <10 (*)    All other components within normal limits  URINALYSIS, ROUTINE W REFLEX MICROSCOPIC - Abnormal; Notable for the following components:   APPearance CLOUDY (*)    Hgb urine dipstick LARGE (*)    Protein, ur 30 (*)    Nitrite POSITIVE (*)    Leukocytes, UA TRACE (*)    Bacteria, UA MANY (*)    Squamous Epithelial / LPF 6-30 (*)    All other components within normal limits  AMMONIA - Abnormal; Notable for the following components:   Ammonia 43 (*)    All other components within normal limits  URINE CULTURE  TROPONIN I  ETHANOL  SALICYLATE LEVEL  TSH  MAGNESIUM    EKG  EKG Interpretation  Date/Time:  Monday July 06 2017 03:01:53 EST Ventricular Rate:  73 PR Interval:    QRS Duration: 86 QT Interval:  368 QTC Calculation: 406 R Axis:   56 Text Interpretation:  Sinus rhythm Biatrial enlargement Probable left ventricular hypertrophy Anterior Q waves, possibly due to LVH Nonspecific T abnormalities, lateral leads Nonspecific T  wave abnormality Confirmed by Glynn Octave [id number]) on 07/06/2017 3:25:57 AM       Radiology Dg Chest 2 View  Result Date: 07/06/2017 CLINICAL DATA:  72 year old female with altered mental status. EXAM: CHEST  2 VIEW COMPARISON:  Chest radiograph dated 01/03/2011 FINDINGS: Lungs are clear. There is no pleural effusion or pneumothorax. Top-normal cardiac size. Probable right hilar calcified granuloma. No acute osseous pathology. IMPRESSION: No active cardiopulmonary disease. Electronically Signed   By: Elgie Collard M.D.   On: 07/06/2017 03:55   Ct Head Wo Contrast  Result Date: 07/06/2017 CLINICAL DATA:  Altered level of consciousness. History of hypertension, substance abuse, stroke. EXAM: CT HEAD WITHOUT CONTRAST TECHNIQUE: Contiguous axial images were obtained from the base of the skull through the vertex without intravenous contrast. COMPARISON:  CT HEAD August 28, 2005 FINDINGS: BRAIN: No intraparenchymal hemorrhage, mass effect nor midline shift. The ventricles and sulci are normal for age. Patchy to confluent supratentorial white matter hypodensities. No acute large vascular territory infarcts. No abnormal extra-axial fluid collections. Basal cisterns are patent. VASCULAR: Mild to moderate calcific atherosclerosis of the carotid siphons. SKULL: No skull fracture. Severe LEFT greater than RIGHT temporomandibular osteoarthrosis. No significant scalp soft tissue swelling. SINUSES/ORBITS: Moderate paranasal sinusitis. Small LEFT ethmoid osteoma. Mastoid air cells are well aerated. Included ocular globes and orbital contents are non-suspicious. OTHER: None. IMPRESSION: 1. No acute intracranial process. 2. Moderate chronic small vessel ischemic disease. Electronically Signed   By: Awilda Metro M.D.   On: 07/06/2017 03:58    Procedures Procedures (including critical care time)  Medications Ordered in ED Medications - No data to display   Initial Impression / Assessment and Plan / ED  Course  I have reviewed the triage vital signs and the nursing notes.  Pertinent labs & imaging results that were available during my care of the patient were reviewed by me and considered in my medical decision making (see chart for details).     Loss of consciousness. Back to baseline now. Patient does not recall episode or any preceding dizziness or lightheadedness. EKG nsr. No seizure activity.  Non focal neuro exam. CT head stable. UDS with cocaine. +UTI. Culture sent. Labs with mild hypokalemia.  Syncope? Seizure also considered. Substance abuse likely playing  a role. Remains confused. Not oriented to time. Unclear baseline.  Admission d/w internal medicine residents. Final Clinical Impressions(s) / ED Diagnoses   Final diagnoses:  Loss of consciousness (HCC)  Cocaine abuse [facility])    ED Discharge Orders    None       Glynn Octave, MD 07/06/17 [phone]

## 2017-07-06 NOTE — Progress Notes (Signed)
   Subjective: [name] was seen resting in her bed this morning. She states that she has been having increased headaches lately and notes having palpitations.  Objective:  Vital signs in last 24 hours: Vitals:   07/06/17 0330 07/06/17 0415 07/06/17 0445 07/06/17 0515  BP: (!) 153/74 (!) 153/81 (!) 142/75 132/82  Pulse: 76 74 71 71  Resp: 19 20 17 17   Temp:      TempSrc:      SpO2: 99% 99% 98% 100%   Physical Exam  Constitutional: She appears well-developed and well-nourished. No distress.  HENT:  Head: Normocephalic and atraumatic.  Eyes: Conjunctivae are normal.  Cardiovascular: Normal rate, regular rhythm, normal heart sounds and intact distal pulses.  Respiratory: Effort normal and breath sounds normal. No respiratory distress. She has no wheezes.  GI: Soft. Bowel sounds are normal. She exhibits no distension. There is no tenderness.  Musculoskeletal: She exhibits no edema.  Neurological: She is alert.  Skin: She is not diaphoretic.  Psychiatric: She has a normal mood and affect. Her behavior is normal. Judgment and thought content normal.    Assessment/Plan:  72 y.o f who presented post a syncopal episode prior to admission.   Syncope The exact cause of the patient's syncope is not definitively known. UDS returned positive for cocaine which places the patient at risk for vasospasms that can prompt a syncopal event. She may benefit from a vasodilator.   The patient reports that she has been having palpitations. Telemetry does not note any abnormal rhythms. Echo will be ordered to check for structural abnormalities.   The patient is less likely to have had a stroke as CT scan was negative. She staes that she has been having headaches which maybe vasospasmotic. She maybe having atypical migraines that can prompt a syncopal event.   -Echo pending -PT and OT eval -continue cardiac monitoring  ? UTI The patient's ua showed positive nitrites, trace leukocytes, many  bacteria. The patient is asymptomatic  -pending urine culture  Hypokalemia The patient's potassium was 2.9 on admission and she was repleted.   -pending bmp that is scheduled for 12pm 1/14 -Mg normal at 1.8  Essential Hypertension The patient's blood pressure today has ranged 140-165/78-88. The patient does not take any bp meds at home.   -Monitor and give hydralazine if >180/100   Dispo: Anticipated discharge in approximately 2-3 day(s).   Lorenso CourierVahini Daphene Chisholm, MD Internal Medicine PGY1 Pager:[phone] 07/06/2017, 10:22 AM

## 2017-07-06 NOTE — H&P (Signed)
Date: 07/06/2017               Patient Name:  Deborah [name] MRN: [id number]  DOB: 07/24/1945 Age / Sex: 72 y.o., female   PCP: Patient, No Pcp Per         Medical Service: Internal Medicine Teaching Service         Attending Physician: Dr. Sandre Kitty, Elwin Mocha, MD    First Contact: Dr. Delma Officer Pager: [phone]  Second Contact: Dr. Vincente Liberty Pager: [phone]       After Hours (After 5p/  First Contact Pager: [phone]  weekends / holidays): Second Contact Pager: [phone]   Chief Complaint: Loss of consciousness   History of Present Illness:  72 yo female with PMHx significant for HTN and depression presenting to [facility] for loss of consciousness. Patient was socializing with people in the common area of her apartment complex and suddenly became unresponsive. Bystanders called EMS who found the patient slumped over in a chair and diaphoretic. Per triage note she was altered for about 10 minutes, but was in normal state of health prior to the event. No seizure activity noted by bystanders. EMS inserted a nasal airway and the patient became more alert. Vital signs were stable: HR 56, BP 96/68, RR 12, 100% RA, CBG 118; improved to HR 80. BP 140/76, O2 sat 97%, RR 20. Upon arrival to [facility] was back to baseline and alert.  The patient has no recollection of this event and does not remember getting transported by EMS to the hospital. Patient not the best historian, but denies CP, SOB, N/V/D surrounding the event or earlier in the day. She does endorse a frontal headache and dizziness. Denies dysuria, hematuria, or changes in urinary frequency. Endorses normal PO intake. She does not take any medications. Denies illicit drug use or alcohol use. This has never happened to the patient before.   ED course: Vital signs stable, mildly hypertensive 147/91 Labs: Na 131, K 2.9, Cr 0.97; UDS + for cocaine; CBC WNL; UA dirty; ethanol, acetaminophen, and salicylate level negative, ammonia level mildly elevate 43, LFTs  WNL, troponin negative Meds: 1x dose of Ceftriaxone Imaging: CT scan of head was negative, CXR negative    Meds:  No outpatient medications have been marked as taking for the 07/06/17 encounter [facility] Encounter).     Allergies: Allergies as of 07/06/2017  . (No Known Allergies)   Past Medical History:  Diagnosis Date  . Alcohol abuse, in remission   . Anxiety   . Asthma   . Cocaine abuse (HCC)   . CVA (cerebral vascular accident) (HCC)   . Degenerative joint disease    back  . Fracture of hand   . Headache, chronic daily   . Hematuria   . Hypertension   . Inadequate material resources   . Mental/behavioral problem   . S/P TAH-BSO   . Tobacco abuse   . Trichomonal vaginitis     Family History:  Family History  Problem Relation Age of Onset  . Cancer Father        colon cancer  . Cancer Paternal Aunt        colon cancer   Social History:  Social History   Tobacco Use  . Smoking status: Former Smoker    Packs/day: 0.10    Types: Cigarettes    Last attempt to quit: 02/25/2011    Years since quitting: 6.3  . Smokeless tobacco: Former Neurosurgeon  . Tobacco comment: quit x 2  months.  Substance Use Topics  . Alcohol use: No    Alcohol/week: 0.0 oz  . Drug use: No    Review of Systems: A complete ROS was negative except as per HPI.   Physical Exam: Blood pressure 132/82, pulse 71, temperature 99.5 F (37.5 C), temperature source Temporal, resp. rate 17, SpO2 100 %. Physical Exam  Constitutional: She appears lethargic. No distress.  Chronically ill appearing   HENT:  Head: Normocephalic and atraumatic.  Mouth/Throat: Oropharynx is clear and moist.  Eyes: Conjunctivae and EOM are normal. No scleral icterus.  Pinpoint pupils bilaterally   Neck: Normal range of motion. Neck supple.  Cardiovascular: Normal rate, regular rhythm, normal heart sounds and intact distal pulses. Exam reveals no gallop and no friction rub.  No murmur heard. Pulmonary/Chest: Effort  normal and breath sounds normal.  Abdominal: Soft. Bowel sounds are normal. There is tenderness.  Neurological: She has normal strength. She appears lethargic. No cranial nerve deficit.  Alert and oriented to person and place. Did not know year, month, or date.     EKG: personally reviewed my interpretation is sinus rhythm, T wave inversions V4, V5, anterior q waves   CXR: personally reviewed my interpretation negative for acute cardiopulmonary disease  Assessment & Plan by Problem: Active Problems:   Loss of consciousness (HCC)  Loss of Consciousness Unclear etiology at this time. With positive UDS substance use is high on differential.Troponin negative, EKG without ST elevations. Seizure unlikely given no seizure like activity reported from bystanders. No gross focal neurologic deficits on exam, CT scan negative for acute intracranial abnormality. Patient back to baseline (AO to person and place, not time). With no apparent prodromal symptoms concerning for arrythmia.  -Cardiac monitoring -Can consider ECHO   Asymptomatic bacteruria UA dirty, received 1x dose of ceftriaxone in ED.  -Urine Cx to be collected  -Abx not continued at this time as patient asymptomatic, but poor historian would reassess   Hypokalemia -K 2.9 on admission  -40 meq potassium Bid x 2 doses -Repeat BMET scheduled @ 12PM -Follow up magnesium   HTN BP normotensive 132/82. Patient not taking any medications currently.  -Will continue to monitor.    Dispo: Admit patient to Observation with expected length of stay less than 2 midnights.  Signed: Toney RakesLacroce, Samantha J, MD 07/06/2017, 6:13 AM  Pager: [phone]

## 2017-07-08 LAB — URINE CULTURE: Culture: >=100000 — AB

## 2017-07-09 ENCOUNTER — Telehealth: Payer: Self-pay | Admitting: *Deleted

## 2017-07-09 NOTE — Telephone Encounter (Signed)
Post ED Visit - Positive Culture Follow-up: Unsuccessful Patient Follow-up  Culture assessed and recommendations reviewed by:  []  Enzo BiNathan Batchelder, Pharm.D. []  Celedonio MiyamotoJeremy Frens, Pharm.D., BCPS AQ-ID []  Garvin FilaMike Maccia, Pharm.D., BCPS []  Georgina PillionElizabeth Martin, [address] BoulevardPharm.D., BCPS []  WinneconneMinh Pham, Vermont[place]arm.D., BCPS, AAHIVP []  Estella HuskMichelle Turner, Pharm.D., BCPS, AAHIVP []  Lysle Pearlachel Rumbarger, PharmD, BCPS []  Casilda Carlsaylor Stone, PharmD, BCPS []  Pollyann SamplesAndy Johnston, PharmD, BCPS Blake DivineShannon Parkey, Pharm D  Positive urine culture  [x]  Patient discharged without antimicrobial prescription and treatment is now indicated []  Organism is resistant to prescribed ED discharge antimicrobial []  Patient with positive blood cultures   Unable to contact patient after 3 attempts, letter will be sent to address on file  Lysle PearlRobertson, Dinari Stgermaine Talley 07/09/2017, 10:16 AM
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
## 2017-07-09 NOTE — Progress Notes (Signed)
ED Antimicrobial Stewardship Positive Culture Follow Up   Deborah [name] is an 72 y.o. female who presented to [facility] 07[facility]/2017 with a chief complaint of  Chief Complaint  Patient presents with  . Loss of Consciousness    Recent Results (from the past 720 hour(s))  Urine Culture     Status: Abnormal   Collection Time: 07/06/17  4:05 AM  Result Value Ref Range Status   Specimen Description URINE, RANDOM  Final   Special Requests NONE  Final   Culture >=100,000 COLONIES/mL ESCHERICHIA COLI (A)  Final   Report Status 07/08/2017 FINAL  Final   Organism ID, Bacteria ESCHERICHIA COLI (A)  Final      Susceptibility   Escherichia coli - MIC*    AMPICILLIN <=2 SENSITIVE Sensitive     CEFAZOLIN <=4 SENSITIVE Sensitive     CEFTRIAXONE <=1 SENSITIVE Sensitive     CIPROFLOXACIN <=0.25 SENSITIVE Sensitive     GENTAMICIN <=1 SENSITIVE Sensitive     IMIPENEM <=0.25 SENSITIVE Sensitive     NITROFURANTOIN <=16 SENSITIVE Sensitive     TRIMETH/SULFA <=20 SENSITIVE Sensitive     AMPICILLIN/SULBACTAM <=2 SENSITIVE Sensitive     PIP/TAZO <=4 SENSITIVE Sensitive     Extended ESBL NEGATIVE Sensitive     * >=100,000 COLONIES/mL ESCHERICHIA COLI     [x]  Patient discharged originally without antimicrobial agent and treatment is now indicated  New antibiotic prescription: keflex 500mg  PO BID x 5 days   ED Provider: Shirlyn GoltzEmily Shrosbree, PA-C   Blake DivineShannon Denym Christenberry 07/09/2017, 9:40 AM Infectious Diseases Pharmacist Phone# [phone]

## 2017-09-25 ENCOUNTER — Encounter (HOSPITAL_COMMUNITY): Payer: Self-pay

## 2017-09-25 ENCOUNTER — Other Ambulatory Visit: Payer: Self-pay

## 2017-09-25 ENCOUNTER — Emergency Department (HOSPITAL_COMMUNITY)
Admission: EM | Admit: 2017-09-25 | Discharge: 2017-09-25 | Disposition: A | Discharge status: Home / Self Care | Payer: Medicare Other | Attending: Physician Assistant | Admitting: Physician Assistant

## 2017-09-25 ENCOUNTER — Emergency Department (HOSPITAL_COMMUNITY): Payer: Medicare Other

## 2017-09-25 DIAGNOSIS — W1830XA Fall on same level, unspecified, initial encounter: Secondary | ICD-10-CM | POA: Diagnosis not present

## 2017-09-25 DIAGNOSIS — Y92481 Parking lot as the place of occurrence of the external cause: Secondary | ICD-10-CM | POA: Diagnosis not present

## 2017-09-25 DIAGNOSIS — W19XXXA Unspecified fall, initial encounter: Secondary | ICD-10-CM

## 2017-09-25 DIAGNOSIS — Z590 Homelessness: Secondary | ICD-10-CM | POA: Insufficient documentation

## 2017-09-25 DIAGNOSIS — Y999 Unspecified external cause status: Secondary | ICD-10-CM | POA: Insufficient documentation

## 2017-09-25 DIAGNOSIS — I6782 Cerebral ischemia: Secondary | ICD-10-CM | POA: Diagnosis not present

## 2017-09-25 DIAGNOSIS — R109 Unspecified abdominal pain: Secondary | ICD-10-CM | POA: Diagnosis not present

## 2017-09-25 DIAGNOSIS — M25512 Pain in left shoulder: Secondary | ICD-10-CM | POA: Diagnosis not present

## 2017-09-25 DIAGNOSIS — Y939 Activity, unspecified: Secondary | ICD-10-CM | POA: Insufficient documentation

## 2017-09-25 DIAGNOSIS — S0990XA Unspecified injury of head, initial encounter: Secondary | ICD-10-CM | POA: Diagnosis not present

## 2017-09-25 DIAGNOSIS — R220 Localized swelling, mass and lump, head: Secondary | ICD-10-CM | POA: Diagnosis not present

## 2017-09-25 DIAGNOSIS — Z043 Encounter for examination and observation following other accident: Secondary | ICD-10-CM | POA: Diagnosis not present

## 2017-09-25 DIAGNOSIS — Z87891 Personal history of nicotine dependence: Secondary | ICD-10-CM | POA: Insufficient documentation

## 2017-09-25 DIAGNOSIS — T148XXA Other injury of unspecified body region, initial encounter: Secondary | ICD-10-CM | POA: Diagnosis not present

## 2017-09-25 LAB — CBC WITH DIFFERENTIAL/PLATELET
BASOS ABS: 0 10*3/uL (ref 0.0–0.1)
BASOS PCT: 0 %
Eosinophils Absolute: 0.2 10*3/uL (ref 0.0–0.7)
Eosinophils Relative: 3 %
HCT: 37.4 % (ref 36.0–46.0)
Hemoglobin: 12.5 g/dL (ref 12.0–15.0)
LYMPHS PCT: 44 %
Lymphs Abs: 2.5 10*3/uL (ref 0.7–4.0)
MCH: 31.6 pg (ref 26.0–34.0)
MCHC: 33.4 g/dL (ref 30.0–36.0)
MCV: 94.4 fL (ref 78.0–100.0)
MONO ABS: 0.4 10*3/uL (ref 0.1–1.0)
Monocytes Relative: 6 %
Neutro Abs: 2.6 10*3/uL (ref 1.7–7.7)
Neutrophils Relative %: 47 %
PLATELETS: 274 10*3/uL (ref 150–400)
RBC: 3.96 MIL/uL (ref 3.87–5.11)
RDW: 14.5 % (ref 11.5–15.5)
WBC: 5.7 10*3/uL (ref 4.0–10.5)

## 2017-09-25 LAB — COMPREHENSIVE METABOLIC PANEL
ALBUMIN: 3.7 g/dL (ref 3.5–5.0)
ALT: 20 U/L (ref 14–54)
ANION GAP: 9 (ref 5–15)
AST: 32 U/L (ref 15–41)
Alkaline Phosphatase: 103 U/L (ref 38–126)
BUN: 20 mg/dL (ref 6–20)
CHLORIDE: 109 mmol/L (ref 101–111)
CO2: 27 mmol/L (ref 22–32)
Calcium: 9.4 mg/dL (ref 8.9–10.3)
Creatinine, Ser: 1.02 mg/dL — ABNORMAL HIGH (ref 0.44–1.00)
GFR calc non Af Amer: 53 mL/min — ABNORMAL LOW (ref >60)
GLUCOSE: 131 mg/dL — AB (ref 65–99)
POTASSIUM: 3.3 mmol/L — AB (ref 3.5–5.1)
SODIUM: 145 mmol/L (ref 135–145)
Total Bilirubin: 0.7 mg/dL (ref 0.3–1.2)
Total Protein: 7.1 g/dL (ref 6.5–8.1)

## 2017-09-25 LAB — CBG MONITORING, ED: GLUCOSE-CAPILLARY: 96 mg/dL (ref 65–99)

## 2017-09-25 LAB — LIPASE, BLOOD: LIPASE: 27 U/L (ref 11–51)

## 2017-09-25 LAB — AMMONIA: Ammonia: 64 umol/L — ABNORMAL HIGH (ref 9–35)

## 2017-09-25 NOTE — ED Triage Notes (Addendum)
BIB EMS, Pt fell yesterday in homeless shelter parking lot. No obvious deformity or injury observed. Pt reports hitting her head. Pt denies anticoagulant use. Pt denies neck pain. Pt A +OX4, ambulatory independently. When asked in triage if she is having pain pts states "No"

## 2017-09-25 NOTE — Discharge Instructions (Addendum)
Please return with any concerns.  Your ammonia is a little bit elevated.  Please have it rechecked as an outpatient and be sure to stay hydrated.

## 2017-09-25 NOTE — ED Notes (Addendum)
Patient is having a difficult time staying awake during this RN's interview and assessment.

## 2017-09-25 NOTE — ED Notes (Signed)
Pt given [place] sandwich, sprite, graham crackers, and bus pass.

## 2017-09-25 NOTE — ED Provider Notes (Signed)
[facility]-EMERGENCY DEPT Provider Note   CSN: [id number] Arrival date & time: 09/25/17  0745     History   Chief Complaint Chief Complaint  Patient presents with  . Fall    HPI Deborah [name] is a 72 y.o. female.  HPI   Patient is a 72 year old female with past history significant for alcohol and cocaine abuse, trichomonas, and, chronic homelessness.  Patient's presenting today with unclear chief complaint.  She reported to EMS that she had fallen yesterday.  As she said "I am here for stomach pain"patient sleeping in the hallway and expresses the wish to continue to sleep in the hallway not be bothered by staff.  Patient is arousable, but obviously very fatigued.  She reports that she did not sleep at all last night.  .  Patient really refusing to cooperate with exam or interview at this time.  Past Medical History:  Diagnosis Date  . Alcohol abuse, in remission   . Anxiety   . Asthma   . Cocaine abuse (HCC)   . CVA (cerebral vascular accident) (HCC)   . Degenerative joint disease    back  . Fracture of hand   . Headache, chronic daily   . Hematuria   . Hypertension   . Inadequate material resources   . Mental/behavioral problem   . S/P TAH-BSO   . Tobacco abuse   . Trichomonal vaginitis     Patient Active Problem List   Diagnosis Date Noted  . Loss of consciousness (HCC) 07/06/2017  . Loss of weight 04/30/2016  . Lower back pain 03/04/2016  . Atopic dermatitis 10/10/2015  . History of CVA (cerebrovascular accident) 09/05/2015  . Health maintenance examination 10/29/2011  . Primary osteoarthritis of both knees 02/28/2011  . Depression 08/10/2009  . DJD (degenerative joint disease) 07/03/2006  . Essential hypertension 06/08/2006  . ASTHMA 06/08/2006  . TAH/BSO, HX OF 06/08/2006    Past Surgical History:  Procedure Laterality Date  . ABDOMINAL HYSTERECTOMY  04/13/1989   Per Dr. Lilian Kapur. Abdominal hysterectomy and bilateral  salpingo-oophorectomy     OB History   None      Home Medications    Prior to Admission medications   Medication Sig Start Date End Date Taking? Authorizing Provider  aspirin 81 MG tablet Take 1 tablet (81 mg total) by mouth daily. Patient not taking: Reported on 07/06/2017 03/04/16   Arnetha Courser, MD  atorvastatin (LIPITOR) 40 MG tablet Take 1 tablet (40 mg total) by mouth daily. Patient not taking: Reported on 07/06/2017 03/04/16   Arnetha Courser, MD  FLUoxetine (PROZAC) 20 MG capsule Take 1 capsule (20 mg total) by mouth daily. Patient not taking: Reported on 07/06/2017 03/04/16   Arnetha Courser, MD  hydrocortisone 2.5 % ointment Apply topically 2 (two) times daily. Patient not taking: Reported on 07/06/2017 10/10/15   Servando Snare, MD  lisinopril-hydrochlorothiazide (PRINZIDE,ZESTORETIC) 20-25 MG tablet Take 1 tablet by mouth daily. Patient not taking: Reported on 07/06/2017 03/04/16   Arnetha Courser, MD  QUEtiapine (SEROQUEL) 50 MG tablet Take 1 tablet (50 mg total) by mouth 2 (two) times daily. Patient not taking: Reported on 07/06/2017 03/04/16   Arnetha Courser, MD    Family History Family History  Problem Relation Age of Onset  . Cancer Father        colon cancer  . Cancer Paternal Aunt        colon cancer    Social History Social History   Tobacco Use  . Smoking  status: Former Smoker    Packs/day: 0.10    Types: Cigarettes    Last attempt to quit: 02/25/2011    Years since quitting: 6.5  . Smokeless tobacco: Former NeurosurgeonUser  . Tobacco comment: quit x 2 months.  Substance Use Topics  . Alcohol use: No    Alcohol/week: 0.0 oz  . Drug use: No     Allergies   Patient has no known allergies.   Review of Systems Review of Systems  Unable to perform ROS: Other     Physical Exam Updated Vital Signs BP (!) 142/74 (BP Location: Right Arm)   Pulse 83   Temp 98.4 F (36.9 C) (Oral)   Resp 18   SpO2 100%   Physical Exam  Constitutional: She appears well-developed  and well-nourished.  Sleeping soundly in the hallway.  HENT:  Head: Normocephalic and atraumatic.  Eyes: Pupils are equal, round, and reactive to light. Conjunctivae are normal. Right eye exhibits no discharge. Left eye exhibits no discharge.  Neck: Neck supple.  Cardiovascular: Normal rate.  Pulmonary/Chest: Effort normal.  Abdominal: Soft. There is no tenderness.  Musculoskeletal: She exhibits no edema.  Neurological:  Patient appears neurologically intact.  Skin: Skin is warm and dry. No rash noted. She is not diaphoretic.  Psychiatric:  Irritable with staff for waking her.  Nursing note and vitals reviewed.    ED Treatments / Results  Labs (all labs ordered are listed, but only abnormal results are displayed) Labs Reviewed - No data to display  EKG None  Radiology No results found.  Procedures Procedures (including critical care time)  Medications Ordered in ED Medications - No data to display   Initial Impression / Assessment and Plan / ED Course  I have reviewed the triage vital signs and the nursing notes.  Pertinent labs & imaging results that were available during my care of the patient were reviewed by me and considered in my medical decision making (see chart for details).    Patient is a 72 year old female with past history significant for alcohol and cocaine abuse, trichomonas, and, chronic homelessness.  Patient's presenting today with unclear chief complaint.  She reported to EMS that she had fallen yesterday.  As she said "I am here for stomach pain"patient sleeping in the hallway and expresses the wish to continue to sleep in the hallway not be bothered by staff.  Patient is arousable, but obviously very fatigued.  She reports that she did not sleep at all last night.  .  Patient really refusing to cooperate with exam or interview at this time  9:04 AM Currently patient appears nontoxic.  We will let patient rest in our facility since it is raining  outside.  Will do CT head because of reported history of fall.  Otherwise when patient awakens, will feed, and complete a better interview.  At 1 PM patient was still sleepy so he decided to do labs.  3:54 PM Patient now looks completely normal.  She is alert talkative.  Labs are reassuring.  Will discharge home.  Final Clinical Impressions(s) / ED Diagnoses   Final diagnoses:  None    ED Discharge Orders    None       Abelino DerrickMackuen, Biran Mayberry Lyn, MD 09/25/17 1554

## 2017-09-25 NOTE — ED Notes (Signed)
Pt. Ambulated with one-person non weight-bearing assistance.

## 2017-09-25 NOTE — ED Notes (Signed)
Patient transported to CT 

## 2017-09-25 NOTE — ED Notes (Signed)
Patient awake and states that she feels better.

## 2017-09-25 NOTE — ED Notes (Signed)
Patient given sandwich and water to eat. Will ambulate patient once she is done with her food.

## 2017-09-25 NOTE — ED Notes (Signed)
Patient states she is missing her black purse that she claims she had when she came in. This RN and secretary Clydie BraunKaren do not recall seeing a black pocket book in patient's belongings. [facility] was called to see if her pocket book was left on the truck before [facility].

## 2017-09-25 NOTE — ED Notes (Signed)
Patient is asleep and resting comfortably.  

## 2017-09-28 ENCOUNTER — Encounter (HOSPITAL_COMMUNITY): Payer: Self-pay

## 2017-09-28 ENCOUNTER — Emergency Department (HOSPITAL_COMMUNITY): Payer: Medicare Other

## 2017-09-28 ENCOUNTER — Other Ambulatory Visit: Payer: Self-pay

## 2017-09-28 ENCOUNTER — Emergency Department (HOSPITAL_COMMUNITY)
Admission: EM | Admit: 2017-09-28 | Discharge: 2017-09-28 | Discharge status: Against Medical Advice | Payer: Medicare Other | Attending: Emergency Medicine | Admitting: Emergency Medicine

## 2017-09-28 DIAGNOSIS — J45909 Unspecified asthma, uncomplicated: Secondary | ICD-10-CM | POA: Insufficient documentation

## 2017-09-28 DIAGNOSIS — Z79899 Other long term (current) drug therapy: Secondary | ICD-10-CM | POA: Insufficient documentation

## 2017-09-28 DIAGNOSIS — I10 Essential (primary) hypertension: Secondary | ICD-10-CM | POA: Diagnosis not present

## 2017-09-28 DIAGNOSIS — Z7982 Long term (current) use of aspirin: Secondary | ICD-10-CM | POA: Diagnosis not present

## 2017-09-28 DIAGNOSIS — R079 Chest pain, unspecified: Secondary | ICD-10-CM | POA: Diagnosis not present

## 2017-09-28 DIAGNOSIS — Z87891 Personal history of nicotine dependence: Secondary | ICD-10-CM | POA: Diagnosis not present

## 2017-09-28 DIAGNOSIS — R072 Precordial pain: Secondary | ICD-10-CM | POA: Insufficient documentation

## 2017-09-28 LAB — URINALYSIS, ROUTINE W REFLEX MICROSCOPIC
BACTERIA UA: NONE SEEN
Bilirubin Urine: NEGATIVE
Glucose, UA: NEGATIVE mg/dL
Ketones, ur: NEGATIVE mg/dL
Leukocytes, UA: NEGATIVE
Nitrite: NEGATIVE
PROTEIN: NEGATIVE mg/dL
SPECIFIC GRAVITY, URINE: 1.025 (ref 1.005–1.030)
pH: 5 (ref 5.0–8.0)

## 2017-09-28 LAB — BASIC METABOLIC PANEL
Anion gap: 10 (ref 5–15)
BUN: 16 mg/dL (ref 6–20)
CALCIUM: 9.5 mg/dL (ref 8.9–10.3)
CHLORIDE: 106 mmol/L (ref 101–111)
CO2: 26 mmol/L (ref 22–32)
CREATININE: 0.94 mg/dL (ref 0.44–1.00)
GFR calc non Af Amer: 59 mL/min — ABNORMAL LOW (ref >60)
GLUCOSE: 101 mg/dL — AB (ref 65–99)
Potassium: 3.6 mmol/L (ref 3.5–5.1)
Sodium: 142 mmol/L (ref 135–145)

## 2017-09-28 LAB — CBC
HCT: 35.7 % — ABNORMAL LOW (ref 36.0–46.0)
Hemoglobin: 11.5 g/dL — ABNORMAL LOW (ref 12.0–15.0)
MCH: 30.6 pg (ref 26.0–34.0)
MCHC: 32.2 g/dL (ref 30.0–36.0)
MCV: 94.9 fL (ref 78.0–100.0)
PLATELETS: 312 10*3/uL (ref 150–400)
RBC: 3.76 MIL/uL — ABNORMAL LOW (ref 3.87–5.11)
RDW: 14.6 % (ref 11.5–15.5)
WBC: 5.4 10*3/uL (ref 4.0–10.5)

## 2017-09-28 LAB — CBG MONITORING, ED: GLUCOSE-CAPILLARY: 87 mg/dL (ref 65–99)

## 2017-09-28 LAB — I-STAT TROPONIN, ED: TROPONIN I, POC: 0 ng/mL (ref 0.00–0.08)

## 2017-09-28 NOTE — ED Provider Notes (Signed)
[facility] EMERGENCY [facility]PARTMENT Provider Note   CSN: [id number] Arrival date & time: 09/28/17  1153     History   Chief Complaint Chief Complaint  Patient presents with  . Blood Sugar Problem  . Chest Pain    HPI Deborah [name] is a 72 y.o. female.  The history is provided by the patient. No language interpreter was used.  Chest Pain      Deborah [name] is a 72 y.o. female who presents to the Emergency Department complaining of need diabetic medications. She presents to the emergency department for diabetic medications. She states that she has diabetes and was recently diagnosed with this that was a long emergency department. She states that her sister died from diabetes and she is concerned she has it as well. She wants to be started on medications but does not have a family doctor. She complains of frequent falls as well as intermittent ongoing chest pain. She denies any alcohol, alcohol, drug use. It's that she currently lives alone and does not have any family or friends that can be contacted for additional information. She does denies any SI, HI. She states that if she cannot have diabetic medications then she will leave and refuses any other evaluation.  Past Medical History:  Diagnosis Date  . Alcohol abuse, in remission   . Anxiety   . Asthma   . Cocaine abuse (HCC)   . CVA (cerebral vascular accident) (HCC)   . Degenerative joint disease    back  . Fracture of hand   . Headache, chronic daily   . Hematuria   . Hypertension   . Inadequate material resources   . Mental/behavioral problem   . S/P TAH-BSO   . Tobacco abuse   . Trichomonal vaginitis     Patient Active Problem List   Diagnosis Date Noted  . Loss of consciousness (HCC) 07/06/2017  . Loss of weight 04/30/2016  . Lower back pain 03/04/2016  . Atopic dermatitis 10/10/2015  . History of CVA (cerebrovascular accident) 09/05/2015  . Health maintenance examination 10/29/2011  . Primary  osteoarthritis of both knees 02/28/2011  . Depression 08/10/2009  . DJD (degenerative joint disease) 07/03/2006  . Essential hypertension 06/08/2006  . ASTHMA 06/08/2006  . TAH/BSO, HX OF 06/08/2006    Past Surgical History:  Procedure Laterality Date  . ABDOMINAL HYSTERECTOMY  04/13/1989   Per Dr. Lilian KapurMcDonald. Abdominal hysterectomy and bilateral salpingo-oophorectomy     OB History   None      Home Medications    Prior to Admission medications   Medication Sig Start Date End Date Taking? Authorizing Provider  aspirin 81 MG tablet Take 1 tablet (81 mg total) by mouth daily. Patient not taking: Reported on 07/06/2017 03/04/16   Arnetha CourserAmin, Sumayya, MD  atorvastatin (LIPITOR) 40 MG tablet Take 1 tablet (40 mg total) by mouth daily. Patient not taking: Reported on 07/06/2017 03/04/16   Arnetha CourserAmin, Sumayya, MD  FLUoxetine (PROZAC) 20 MG capsule Take 1 capsule (20 mg total) by mouth daily. Patient not taking: Reported on 07/06/2017 03/04/16   Arnetha CourserAmin, Sumayya, MD  hydrocortisone 2.5 % ointment Apply topically 2 (two) times daily. Patient not taking: Reported on 07/06/2017 10/10/15   Servando SnareBurns, Alexa R, MD  lisinopril-hydrochlorothiazide (PRINZIDE,ZESTORETIC) 20-25 MG tablet Take 1 tablet by mouth daily. Patient not taking: Reported on 07/06/2017 03/04/16   Arnetha CourserAmin, Sumayya, MD  QUEtiapine (SEROQUEL) 50 MG tablet Take 1 tablet (50 mg total) by mouth 2 (two) times daily. Patient not taking:  Reported on 07/06/2017 03/04/16   Arnetha Courser, MD    Family History Family History  Problem Relation Age of Onset  . Cancer Father        colon cancer  . Cancer Paternal Aunt        colon cancer    Social History Social History   Tobacco Use  . Smoking status: Former Smoker    Packs/day: 0.10    Types: Cigarettes    Last attempt to quit: 02/25/2011    Years since quitting: 6.5  . Smokeless tobacco: Former Neurosurgeon  . Tobacco comment: quit x 2 months.  Substance Use Topics  . Alcohol use: No    Alcohol/week: 0.0 oz    . Drug use: No     Allergies   Patient has no known allergies.   Review of Systems Review of Systems  Cardiovascular: Positive for chest pain.  All other systems reviewed and are negative.    Physical Exam Updated Vital Signs BP (!) 192/80 (BP Location: Right Arm)   Pulse 77   Temp 98.1 F (36.7 C) (Oral)   Resp 16   Ht 5\' 4"  (1.626 m)   Wt 68 kg (150 lb)   SpO2 100%   BMI 25.75 kg/m   Physical Exam  Constitutional: She is oriented to person, place, and time. She appears well-developed and well-nourished.  HENT:  Head: Normocephalic and atraumatic.  Cardiovascular: Normal rate and regular rhythm.  No murmur heard. Pulmonary/Chest: Effort normal and breath sounds normal. No respiratory distress.  Abdominal: Soft. There is no tenderness. There is no rebound and no guarding.  Musculoskeletal: She exhibits no edema or tenderness.  Neurological: She is alert and oriented to person, place, and time.  Skin: Skin is warm and dry.  Psychiatric:  Anxious appearing. Denies SI, HI, hallucinations.  Nursing note and vitals reviewed.    ED Treatments / Results  Labs (all labs ordered are listed, but only abnormal results are displayed) Labs Reviewed  BASIC METABOLIC PANEL - Abnormal; Notable for the following components:      Result Value   Glucose, Bld 101 (*)    GFR calc non Af Amer 59 (*)    All other components within normal limits  CBC - Abnormal; Notable for the following components:   RBC 3.76 (*)    Hemoglobin 11.5 (*)    HCT 35.7 (*)    All other components within normal limits  URINALYSIS, ROUTINE W REFLEX MICROSCOPIC - Abnormal; Notable for the following components:   Hgb urine dipstick MODERATE (*)    Squamous Epithelial / LPF 0-5 (*)    All other components within normal limits  I-STAT TROPONIN, ED  CBG MONITORING, ED    EKG EKG Interpretation  Date/Time:  Monday September 28 2017 13:32:21 EDT Ventricular Rate:  68 PR Interval:  140 QRS  Duration: 86 QT Interval:  404 QTC Calculation: 429 R Axis:   36 Text Interpretation:  Normal sinus rhythm Right atrial enlargement Septal infarct , age undetermined Abnormal ECG Confirmed by Tilden Fossa [phone]) on 09/28/2017 7:15:48 PM   Radiology Dg Chest 2 View  Result Date: 09/28/2017 CLINICAL DATA:  Chest pain. EXAM: CHEST - 2 VIEW COMPARISON:  Radiographs of July 06, 2017. FINDINGS: The heart size and mediastinal contours are within normal limits. Both lungs are clear. No pneumothorax or pleural effusion is noted. The visualized skeletal structures are unremarkable. IMPRESSION: No active cardiopulmonary disease. Electronically Signed   By: Lupita Raider, M.D.  On: 09/28/2017 14:43    Procedures Procedures (including critical care time)  Medications Ordered in ED Medications - No data to display   Initial Impression / Assessment and Plan / ED Course  I have reviewed the triage vital signs and the nursing notes.  Pertinent labs & imaging results that were available during my care of the patient were reviewed by me and considered in my medical decision making (see chart for details).     Patient in the emergency department requesting diabetic medications. Blood sugar is within normal limits. Discussed with patient we will not start diabetic medications in the emergency department based on her history and lab values. Do recommend evaluation of for her reports of intermittent chest pain and hypertension and patient refuses further evaluation. She also declines discussions with family or friends and she states that she has a caregiver. She is not actively psychotic or a danger to herself and she was discharged against medical advice.  Final Clinical Impressions(s) / ED Diagnoses   Final diagnoses:  None    ED Discharge Orders    None       Tilden Fossa, MD 09/28/17 1949

## 2017-09-28 NOTE — ED Triage Notes (Signed)
Pt states that the last time she was seen she was told she may have diabetes and she is here today to get on medication for diabetes. Pt does not have formal diabetes diagnosis.

## 2017-09-28 NOTE — ED Notes (Signed)
Patient repeatedly states that she needs her medicine and that she doesn't want to die.

## 2017-09-28 NOTE — ED Notes (Signed)
Patient is homeless

## 2017-09-28 NOTE — ED Notes (Signed)
After speaking with provider, patient states that she didn't like what she said and only wants her medicine. Attempted to redirect patient with no result. Patient insisted and leaving without paperwork and was walked to lobby. [facility] because we "are not helping her"

## 2017-09-29 ENCOUNTER — Other Ambulatory Visit: Payer: Self-pay

## 2017-10-26 ENCOUNTER — Emergency Department (HOSPITAL_COMMUNITY): Payer: Medicare Other

## 2017-10-26 ENCOUNTER — Other Ambulatory Visit: Payer: Self-pay

## 2017-10-26 ENCOUNTER — Encounter (HOSPITAL_COMMUNITY): Payer: Self-pay

## 2017-10-26 ENCOUNTER — Emergency Department (HOSPITAL_COMMUNITY)
Admission: EM | Admit: 2017-10-26 | Discharge: 2017-10-26 | Disposition: A | Discharge status: Home / Self Care | Payer: Medicare Other | Attending: Emergency Medicine | Admitting: Emergency Medicine

## 2017-10-26 DIAGNOSIS — Z7982 Long term (current) use of aspirin: Secondary | ICD-10-CM | POA: Diagnosis not present

## 2017-10-26 DIAGNOSIS — Z5900 Homelessness unspecified: Secondary | ICD-10-CM

## 2017-10-26 DIAGNOSIS — R079 Chest pain, unspecified: Secondary | ICD-10-CM | POA: Diagnosis not present

## 2017-10-26 DIAGNOSIS — R0602 Shortness of breath: Secondary | ICD-10-CM | POA: Diagnosis not present

## 2017-10-26 DIAGNOSIS — J45909 Unspecified asthma, uncomplicated: Secondary | ICD-10-CM | POA: Diagnosis not present

## 2017-10-26 DIAGNOSIS — R0789 Other chest pain: Secondary | ICD-10-CM | POA: Diagnosis not present

## 2017-10-26 DIAGNOSIS — Z79899 Other long term (current) drug therapy: Secondary | ICD-10-CM | POA: Insufficient documentation

## 2017-10-26 DIAGNOSIS — F1410 Cocaine abuse, uncomplicated: Secondary | ICD-10-CM

## 2017-10-26 DIAGNOSIS — I10 Essential (primary) hypertension: Secondary | ICD-10-CM | POA: Insufficient documentation

## 2017-10-26 DIAGNOSIS — Z8673 Personal history of transient ischemic attack (TIA), and cerebral infarction without residual deficits: Secondary | ICD-10-CM | POA: Diagnosis not present

## 2017-10-26 DIAGNOSIS — I213 ST elevation (STEMI) myocardial infarction of unspecified site: Secondary | ICD-10-CM | POA: Diagnosis not present

## 2017-10-26 DIAGNOSIS — Z87891 Personal history of nicotine dependence: Secondary | ICD-10-CM | POA: Insufficient documentation

## 2017-10-26 DIAGNOSIS — M25569 Pain in unspecified knee: Secondary | ICD-10-CM | POA: Diagnosis not present

## 2017-10-26 LAB — URINALYSIS, ROUTINE W REFLEX MICROSCOPIC
BILIRUBIN URINE: NEGATIVE
Bacteria, UA: NONE SEEN
Glucose, UA: NEGATIVE mg/dL
Ketones, ur: NEGATIVE mg/dL
LEUKOCYTES UA: NEGATIVE
Nitrite: NEGATIVE
PH: 5 (ref 5.0–8.0)
Protein, ur: NEGATIVE mg/dL
Specific Gravity, Urine: 1.026 (ref 1.005–1.030)

## 2017-10-26 LAB — RAPID URINE DRUG SCREEN, HOSP PERFORMED
Amphetamines: NOT DETECTED
BENZODIAZEPINES: NOT DETECTED
Barbiturates: NOT DETECTED
COCAINE: POSITIVE — AB
OPIATES: NOT DETECTED
TETRAHYDROCANNABINOL: NOT DETECTED

## 2017-10-26 LAB — CBC
HCT: 36.4 % (ref 36.0–46.0)
HEMOGLOBIN: 11.7 g/dL — AB (ref 12.0–15.0)
MCH: 30.4 pg (ref 26.0–34.0)
MCHC: 32.1 g/dL (ref 30.0–36.0)
MCV: 94.5 fL (ref 78.0–100.0)
PLATELETS: 385 10*3/uL (ref 150–400)
RBC: 3.85 MIL/uL — ABNORMAL LOW (ref 3.87–5.11)
RDW: 14.5 % (ref 11.5–15.5)
WBC: 6.4 10*3/uL (ref 4.0–10.5)

## 2017-10-26 LAB — BASIC METABOLIC PANEL
ANION GAP: 9 (ref 5–15)
BUN: 23 mg/dL — AB (ref 6–20)
CALCIUM: 9.3 mg/dL (ref 8.9–10.3)
CO2: 28 mmol/L (ref 22–32)
CREATININE: 1.06 mg/dL — AB (ref 0.44–1.00)
Chloride: 107 mmol/L (ref 101–111)
GFR calc Af Amer: 59 mL/min — ABNORMAL LOW (ref >60)
GFR, EST NON AFRICAN AMERICAN: 51 mL/min — AB (ref >60)
Glucose, Bld: 79 mg/dL (ref 65–99)
Potassium: 3.9 mmol/L (ref 3.5–5.1)
Sodium: 144 mmol/L (ref 135–145)

## 2017-10-26 LAB — I-STAT TROPONIN, ED
TROPONIN I, POC: 0 ng/mL (ref 0.00–0.08)
Troponin i, poc: 0 ng/mL (ref 0.00–0.08)

## 2017-10-26 LAB — ETHANOL: Alcohol, Ethyl (B): <10 mg/dL (ref <10)

## 2017-10-26 NOTE — Discharge Instructions (Signed)
Do not use cocaine. 

## 2017-10-26 NOTE — ED Provider Notes (Signed)
[facility] EMERGENCY DEPARTMENT Provider Note   CSN: [id number] Arrival date & time: 10/26/17  1732     History   Chief Complaint Chief Complaint  Patient presents with  . Chest Pain    HPI Marykatherine Sherwood is a 72 y.o. female.  Pt presents to the ED today with cp.  The pt said she's had cp for the past few months.  Pt was given asa by ems.  Pain is gone now.  Pt is a poor historian and does not want to give much hx.      Past Medical History:  Diagnosis Date  . Alcohol abuse, in remission   . Anxiety   . Asthma   . Cocaine abuse (HCC)   . CVA (cerebral vascular accident) (HCC)   . Degenerative joint disease    back  . Fracture of hand   . Headache, chronic daily   . Hematuria   . Hypertension   . Inadequate material resources   . Mental/behavioral problem   . S/P TAH-BSO   . Tobacco abuse   . Trichomonal vaginitis     Patient Active Problem List   Diagnosis Date Noted  . Loss of consciousness (HCC) 07/06/2017  . Loss of weight 04/30/2016  . Lower back pain 03/04/2016  . Atopic dermatitis 10/10/2015  . History of CVA (cerebrovascular accident) 09/05/2015  . Health maintenance examination 10/29/2011  . Primary osteoarthritis of both knees 02/28/2011  . Depression 08/10/2009  . DJD (degenerative joint disease) 07/03/2006  . Essential hypertension 06/08/2006  . ASTHMA 06/08/2006  . TAH/BSO, HX OF 06/08/2006    Past Surgical History:  Procedure Laterality Date  . ABDOMINAL HYSTERECTOMY  04/13/1989   Per Dr. Lilian Kapur. Abdominal hysterectomy and bilateral salpingo-oophorectomy     OB History   None      Home Medications    Prior to Admission medications   Medication Sig Start Date End Date Taking? Authorizing Provider  aspirin 81 MG tablet Take 1 tablet (81 mg total) by mouth daily. Patient not taking: Reported on 07/06/2017 03/04/16   Arnetha Courser, MD  atorvastatin (LIPITOR) 40 MG tablet Take 1 tablet (40 mg total) by mouth  daily. Patient not taking: Reported on 07/06/2017 03/04/16   Arnetha Courser, MD  FLUoxetine (PROZAC) 20 MG capsule Take 1 capsule (20 mg total) by mouth daily. Patient not taking: Reported on 07/06/2017 03/04/16   Arnetha Courser, MD  hydrocortisone 2.5 % ointment Apply topically 2 (two) times daily. Patient not taking: Reported on 07/06/2017 10/10/15   Servando Snare, MD  lisinopril-hydrochlorothiazide (PRINZIDE,ZESTORETIC) 20-25 MG tablet Take 1 tablet by mouth daily. Patient not taking: Reported on 07/06/2017 03/04/16   Arnetha Courser, MD  QUEtiapine (SEROQUEL) 50 MG tablet Take 1 tablet (50 mg total) by mouth 2 (two) times daily. Patient not taking: Reported on 07/06/2017 03/04/16   Arnetha Courser, MD    Family History Family History  Problem Relation Age of Onset  . Cancer Father        colon cancer  . Cancer Paternal Aunt        colon cancer    Social History Social History   Tobacco Use  . Smoking status: Former Smoker    Packs/day: 0.10    Types: Cigarettes    Last attempt to quit: 02/25/2011    Years since quitting: 6.6  . Smokeless tobacco: Former Neurosurgeon  . Tobacco comment: quit x 2 months.  Substance Use Topics  . Alcohol use: No  Alcohol/week: 0.0 oz  . Drug use: No     Allergies   Patient has no known allergies.   Review of Systems Review of Systems  Cardiovascular: Positive for chest pain.  All other systems reviewed and are negative.    Physical Exam Updated Vital Signs BP 138/74   Pulse 80   Temp 98.6 F (37 C) (Oral)   Resp 16   Ht  (1.626 m)   Wt 68 kg (150 lb)   SpO2 100%   BMI 25.75 kg/m   Physical Exam  Constitutional: She is oriented to person, place, and time. She appears well-developed and well-nourished.  HENT:  Head: Normocephalic and atraumatic.  Eyes: Pupils are equal, round, and reactive to light. EOM are normal.  Neck: Normal range of motion. Neck supple.  Cardiovascular: Normal rate, regular rhythm, intact distal pulses and  normal pulses.  Pulmonary/Chest: Effort normal and breath sounds normal.  Abdominal: Soft. Bowel sounds are normal.  Musculoskeletal: Normal range of motion.       Right lower leg: Normal.       Left lower leg: Normal.  Neurological: She is alert and oriented to person, place, and time.  Skin: Skin is warm and dry. Capillary refill takes less than 2 seconds.  Psychiatric: She has a normal mood and affect.  Nursing note and vitals reviewed.    ED Treatments / Results  Labs (all labs ordered are listed, but only abnormal results are displayed) Labs Reviewed  BASIC METABOLIC PANEL - Abnormal; Notable for the following components:      Result Value   BUN 23 (*)    Creatinine, Ser 1.06 (*)    GFR calc non Af Amer 51 (*)    GFR calc Af Amer 59 (*)    All other components within normal limits  CBC - Abnormal; Notable for the following components:   RBC 3.85 (*)    Hemoglobin 11.7 (*)    All other components within normal limits  URINALYSIS, ROUTINE W REFLEX MICROSCOPIC - Abnormal; Notable for the following components:   Hgb urine dipstick MODERATE (*)    All other components within normal limits  RAPID URINE DRUG SCREEN, HOSP PERFORMED - Abnormal; Notable for the following components:   Cocaine POSITIVE (*)    All other components within normal limits  ETHANOL  I-STAT TROPONIN, ED  I-STAT TROPONIN, ED    EKG EKG Interpretation  Date/Time:  Monday Oct 26 2017 17:34:01 EDT Ventricular Rate:  70 PR Interval:    QRS Duration: 85 QT Interval:  390 QTC Calculation: 421 R Axis:   36 Text Interpretation:  Sinus rhythm Right atrial enlargement Left ventricular hypertrophy Anterior infarct, old No significant change since last tracing Confirmed by Jacalyn Lefevre [phone]) on 10/26/2017 5:42:42 PM   Radiology Dg Chest 2 View  Result Date: 10/26/2017 CLINICAL DATA:  Chest pain.  Short of breath. EXAM: CHEST - 2 VIEW COMPARISON:  09/28/2017 FINDINGS: Normal heart size. Lungs clear. No  pneumothorax. No pleural effusion. Chronic appearing left mid posterolateral rib deformity. IMPRESSION: No active cardiopulmonary disease. Electronically Signed   By: Jolaine Click M.D.   On: 10/26/2017 18:43    Procedures Procedures (including critical care time)  Medications Ordered in ED Medications - No data to display   Initial Impression / Assessment and Plan / ED Course  I have reviewed the triage vital signs and the nursing notes.  Pertinent labs & imaging results that were available during my care of the  patient were reviewed by me and considered in my medical decision making (see chart for details).     CP has been going on for 3 months.  2 negative troponins.  Pt is + for cocaine.  She is told to stop.  She is encouraged to establish primary care.  Return if worse.  Final Clinical Impressions(s) / ED Diagnoses   Final diagnoses:  Atypical chest pain  Cocaine abuse [facility])  Homeless    ED Discharge Orders    None       Jacalyn Lefevre, MD 10/26/17 2222

## 2017-10-26 NOTE — ED Triage Notes (Signed)
Pt brought in by [facility] for intermittent CP and SOB x3 months ago. Pt states pain is on the left side and sometimes radiates to the right. Pt endorses bilateral knee swelling as well. Pt given  aspirin PTA. Pt initially rated pain 3/10, but during transport 0/10. Pt in NAD during triage.

## 2017-11-18 DIAGNOSIS — L309 Dermatitis, unspecified: Secondary | ICD-10-CM | POA: Diagnosis not present

## 2018-01-10 ENCOUNTER — Emergency Department (HOSPITAL_COMMUNITY)
Admission: EM | Admit: 2018-01-10 | Discharge: 2018-01-10 | Disposition: A | Discharge status: Home / Self Care | Payer: Medicare Other | Attending: Emergency Medicine | Admitting: Emergency Medicine

## 2018-01-10 ENCOUNTER — Other Ambulatory Visit: Payer: Self-pay

## 2018-01-10 ENCOUNTER — Encounter (HOSPITAL_COMMUNITY): Payer: Self-pay

## 2018-01-10 ENCOUNTER — Emergency Department (HOSPITAL_COMMUNITY): Payer: Medicare Other

## 2018-01-10 DIAGNOSIS — S92332A Displaced fracture of third metatarsal bone, left foot, initial encounter for closed fracture: Secondary | ICD-10-CM | POA: Diagnosis not present

## 2018-01-10 DIAGNOSIS — S92322A Displaced fracture of second metatarsal bone, left foot, initial encounter for closed fracture: Secondary | ICD-10-CM | POA: Insufficient documentation

## 2018-01-10 DIAGNOSIS — S92352A Displaced fracture of fifth metatarsal bone, left foot, initial encounter for closed fracture: Secondary | ICD-10-CM | POA: Diagnosis not present

## 2018-01-10 DIAGNOSIS — S93125A Dislocation of metatarsophalangeal joint of left lesser toe(s), initial encounter: Secondary | ICD-10-CM | POA: Insufficient documentation

## 2018-01-10 DIAGNOSIS — S92515A Nondisplaced fracture of proximal phalanx of left lesser toe(s), initial encounter for closed fracture: Secondary | ICD-10-CM | POA: Diagnosis not present

## 2018-01-10 DIAGNOSIS — Z87891 Personal history of nicotine dependence: Secondary | ICD-10-CM | POA: Insufficient documentation

## 2018-01-10 DIAGNOSIS — Y999 Unspecified external cause status: Secondary | ICD-10-CM | POA: Diagnosis not present

## 2018-01-10 DIAGNOSIS — I10 Essential (primary) hypertension: Secondary | ICD-10-CM | POA: Diagnosis not present

## 2018-01-10 DIAGNOSIS — S92342A Displaced fracture of fourth metatarsal bone, left foot, initial encounter for closed fracture: Secondary | ICD-10-CM | POA: Insufficient documentation

## 2018-01-10 DIAGNOSIS — Y9301 Activity, walking, marching and hiking: Secondary | ICD-10-CM | POA: Diagnosis not present

## 2018-01-10 DIAGNOSIS — R109 Unspecified abdominal pain: Secondary | ICD-10-CM | POA: Diagnosis not present

## 2018-01-10 DIAGNOSIS — J45909 Unspecified asthma, uncomplicated: Secondary | ICD-10-CM | POA: Insufficient documentation

## 2018-01-10 DIAGNOSIS — S99922A Unspecified injury of left foot, initial encounter: Secondary | ICD-10-CM | POA: Diagnosis present

## 2018-01-10 DIAGNOSIS — T07XXXA Unspecified multiple injuries, initial encounter: Secondary | ICD-10-CM | POA: Diagnosis not present

## 2018-01-10 DIAGNOSIS — Y929 Unspecified place or not applicable: Secondary | ICD-10-CM | POA: Diagnosis not present

## 2018-01-10 DIAGNOSIS — S92909A Unspecified fracture of unspecified foot, initial encounter for closed fracture: Secondary | ICD-10-CM

## 2018-01-10 MED ORDER — HYDROCODONE-ACETAMINOPHEN 5-325 MG PO TABS
1.0000 | ORAL_TABLET | Freq: Once | ORAL | Status: DC
  Filled 2018-01-10: qty 1

## 2018-01-10 MED ORDER — LIDOCAINE HCL (PF) 1 % IJ SOLN
30.0000 mL | Freq: Once | INTRAMUSCULAR | Status: AC
  Administered 2018-01-10: 30 mL
  Filled 2018-01-10: qty 30

## 2018-01-10 MED ORDER — OXYCODONE-ACETAMINOPHEN 5-325 MG PO TABS: 1.0000 | ORAL_TABLET | ORAL | 0 refills | Status: DC | PRN

## 2018-01-10 MED ORDER — OXYCODONE-ACETAMINOPHEN 5-325 MG PO TABS
1.0000 | ORAL_TABLET | Freq: Once | ORAL | Status: AC
  Administered 2018-01-10: 1 via ORAL
  Filled 2018-01-10: qty 1

## 2018-01-10 MED ORDER — OXYCODONE-ACETAMINOPHEN 5-325 MG PO TABS
1.0000 | ORAL_TABLET | ORAL | Status: DC | PRN
  Administered 2018-01-10: 1 via ORAL
  Filled 2018-01-10: qty 1

## 2018-01-10 NOTE — ED Notes (Signed)
Xray tech taking portable xray at bedside
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
## 2018-01-10 NOTE — Discharge Instructions (Signed)
Schedule to see Dr. Hewitt for evaluation  

## 2018-01-10 NOTE — ED Notes (Signed)
Patient verbalizes understanding of discharge instructions. Opportunity for questioning and answers were provided. Armband removed by staff, pt discharged from ED.  

## 2018-01-10 NOTE — Progress Notes (Signed)
Orthopedic Tech Progress Note Patient Details:  Deborah LevelsCleve [name] [birth year] [id number]  Ortho Devices Type of Ortho Device: Ace wrap, CAM walker Ortho Device/Splint Location: lle Ortho Device/Splint Interventions: Application   Post Interventions Patient Tolerated: Well Instructions Provided: Care of device Pt unable to use crutches RN and Doctor notified  Nikki DomCrawford, Gerrianne Aydelott 01/10/2018, 4:50 PM

## 2018-01-29 DIAGNOSIS — M79672 Pain in left foot: Secondary | ICD-10-CM | POA: Diagnosis not present

## 2018-02-11 ENCOUNTER — Ambulatory Visit: Payer: Medicare Other | Admitting: Podiatry

## 2018-03-31 ENCOUNTER — Other Ambulatory Visit: Payer: Self-pay | Admitting: Internal Medicine

## 2018-03-31 ENCOUNTER — Encounter (HOSPITAL_COMMUNITY): Payer: Self-pay | Admitting: *Deleted

## 2018-03-31 ENCOUNTER — Emergency Department (HOSPITAL_COMMUNITY)
Admission: EM | Admit: 2018-03-31 | Discharge: 2018-03-31 | Disposition: A | Discharge status: Home / Self Care | Payer: Medicare Other | Attending: Emergency Medicine | Admitting: Emergency Medicine

## 2018-03-31 DIAGNOSIS — J45909 Unspecified asthma, uncomplicated: Secondary | ICD-10-CM | POA: Insufficient documentation

## 2018-03-31 DIAGNOSIS — Z87891 Personal history of nicotine dependence: Secondary | ICD-10-CM | POA: Insufficient documentation

## 2018-03-31 DIAGNOSIS — I10 Essential (primary) hypertension: Secondary | ICD-10-CM | POA: Insufficient documentation

## 2018-03-31 DIAGNOSIS — L02411 Cutaneous abscess of right axilla: Secondary | ICD-10-CM | POA: Diagnosis not present

## 2018-03-31 DIAGNOSIS — Z23 Encounter for immunization: Secondary | ICD-10-CM | POA: Diagnosis not present

## 2018-03-31 DIAGNOSIS — Z1231 Encounter for screening mammogram for malignant neoplasm of breast: Secondary | ICD-10-CM

## 2018-03-31 DIAGNOSIS — R2231 Localized swelling, mass and lump, right upper limb: Secondary | ICD-10-CM | POA: Diagnosis present

## 2018-03-31 MED ORDER — CEPHALEXIN 500 MG PO CAPS: 500.0000 mg | ORAL_CAPSULE | Freq: Four times a day (QID) | ORAL | 0 refills | Status: DC

## 2018-03-31 MED ORDER — LIDOCAINE-EPINEPHRINE (PF) 2 %-1:200000 IJ SOLN
10.0000 mL | Freq: Once | INTRAMUSCULAR | Status: AC
  Administered 2018-03-31: 10 mL
  Filled 2018-03-31: qty 20

## 2018-03-31 MED ORDER — TETANUS-DIPHTH-ACELL PERTUSSIS 5-2.5-18.5 LF-MCG/0.5 IM SUSP
0.5000 mL | Freq: Once | INTRAMUSCULAR | Status: AC
  Administered 2018-03-31: 0.5 mL via INTRAMUSCULAR
  Filled 2018-03-31: qty 0.5

## 2018-03-31 NOTE — ED Triage Notes (Signed)
Pt at desk making a telephone call to family.

## 2018-03-31 NOTE — ED Triage Notes (Signed)
Pt in c/o abscess to bilateral axillary area, no distress noted

## 2018-03-31 NOTE — ED Notes (Signed)
Declined W/C at D/C and was escorted to lobby by RN. 

## 2018-03-31 NOTE — ED Triage Notes (Signed)
PT at staff desk hitting desk reporting " I did not come here to wait."

## 2018-03-31 NOTE — ED Provider Notes (Signed)
[facility] EMERGENCY DEPARTMENT Provider Note   CSN: [id number] Arrival date & time: 03/31/18  1408     History   Chief Complaint Chief Complaint  Patient presents with  . Abscess    HPI Deborah [name] is a 72 y.o. female.  Deborah [name] is a 72 y.o. Female with a history of hypertension, CVA, asthma, anxiety, alcohol and cocaine abuse, who presents to the emergency department for evaluation of abscesses to both axilla.  She reports these have been present for about a week and on the right side in particular they have been increasing in size.  She reports they are red and painful to the touch but she has not noted any drainage from these areas.  She does report she has had these in the past and tries to drain them at home.  No history of diabetes.  Denies fevers or chills, no nausea or vomiting.     Past Medical History:  Diagnosis Date  . Alcohol abuse, in remission   . Anxiety   . Asthma   . Cocaine abuse (HCC)   . CVA (cerebral vascular accident) (HCC)   . Degenerative joint disease    back  . Fracture of hand   . Headache, chronic daily   . Hematuria   . Hypertension   . Inadequate material resources   . Mental/behavioral problem   . S/P TAH-BSO   . Tobacco abuse   . Trichomonal vaginitis     Patient Active Problem List   Diagnosis Date Noted  . Loss of consciousness (HCC) 07/06/2017  . Loss of weight 04/30/2016  . Lower back pain 03/04/2016  . Atopic dermatitis 10/10/2015  . History of CVA (cerebrovascular accident) 09/05/2015  . Health maintenance examination 10/29/2011  . Primary osteoarthritis of both knees 02/28/2011  . Depression 08/10/2009  . DJD (degenerative joint disease) 07/03/2006  . Essential hypertension 06/08/2006  . ASTHMA 06/08/2006  . TAH/BSO, HX OF 06/08/2006    Past Surgical History:  Procedure Laterality Date  . ABDOMINAL HYSTERECTOMY  04/13/1989   Per Dr. Lilian Kapur. Abdominal hysterectomy and bilateral  salpingo-oophorectomy     OB History   None      Home Medications    Prior to Admission medications   Medication Sig Start Date End Date Taking? Authorizing Provider  aspirin 81 MG tablet Take 1 tablet (81 mg total) by mouth daily. Patient not taking: Reported on 07/06/2017 03/04/16   Arnetha Courser, MD  atorvastatin (LIPITOR) 40 MG tablet Take 1 tablet (40 mg total) by mouth daily. Patient not taking: Reported on 07/06/2017 03/04/16   Arnetha Courser, MD  FLUoxetine (PROZAC) 20 MG capsule Take 1 capsule (20 mg total) by mouth daily. Patient not taking: Reported on 07/06/2017 03/04/16   Arnetha Courser, MD  hydrocortisone 2.5 % ointment Apply topically 2 (two) times daily. Patient not taking: Reported on 07/06/2017 10/10/15   Servando Snare, MD  lisinopril-hydrochlorothiazide (PRINZIDE,ZESTORETIC) 20-25 MG tablet Take 1 tablet by mouth daily. Patient not taking: Reported on 07/06/2017 03/04/16   Arnetha Courser, MD  oxyCODONE-acetaminophen (PERCOCET/ROXICET) 5-325 MG tablet Take 1 tablet by mouth every 4 (four) hours as needed for severe pain. 01/10/18   Elson Areas, PA-C  QUEtiapine (SEROQUEL) 50 MG tablet Take 1 tablet (50 mg total) by mouth 2 (two) times daily. Patient not taking: Reported on 07/06/2017 03/04/16   Arnetha Courser, MD    Family History Family History  Problem Relation Age of Onset  . Cancer Father  colon cancer  . Cancer Paternal Aunt        colon cancer    Social History Social History   Tobacco Use  . Smoking status: Former Smoker    Packs/day: 0.10    Types: Cigarettes    Last attempt to quit: 02/25/2011    Years since quitting: 7.0  . Smokeless tobacco: Former Neurosurgeon  . Tobacco comment: quit x 2 months.  Substance Use Topics  . Alcohol use: No    Alcohol/week: 0.0 standard drinks  . Drug use: No     Allergies   Patient has no known allergies.   Review of Systems Review of Systems  Constitutional: Negative for chills and fever.  Gastrointestinal:  Negative for nausea and vomiting.  Skin:       Abscess     Physical Exam Updated Vital Signs BP (!) 176/106 (BP Location: Right Arm)   Pulse 71   Temp 98.2 F (36.8 C) (Oral)   Resp 17   Ht 5\' 4"  (1.626 m)   Wt 63.5 kg   SpO2 99%   BMI 24.03 kg/m   Physical Exam  Constitutional: She appears well-developed and well-nourished. No distress.  HENT:  Head: Normocephalic and atraumatic.  Eyes: Right eye exhibits no discharge. Left eye exhibits no discharge.  Pulmonary/Chest: Effort normal. No respiratory distress.  Neurological: She is alert. Coordination normal.  Skin: Skin is warm and dry. Capillary refill takes less than 2 seconds. She is not diaphoretic.  3 x 2 cm abscess with small head noted in the right axilla, no expressible drainage, minimal surrounding cellulitis, patient also has three 1 cm areas of induration in the left axilla without a head present and no expressible drainage.  Psychiatric: She has a normal mood and affect. Her behavior is normal.  Nursing note and vitals reviewed.    ED Treatments / Results  Labs (all labs ordered are listed, but only abnormal results are displayed) Labs Reviewed - No data to display  EKG None  Radiology No results found.  Procedures .Marland KitchenIncision and Drainage Date/Time: 03/31/2018 5:41 PM Performed by: Dartha Lodge, PA-C Authorized by: Dartha Lodge, PA-C   Consent:    Consent obtained:  Verbal   Consent given by:  Patient   Risks discussed:  Bleeding, incomplete drainage, pain, infection and damage to other organs   Alternatives discussed:  No treatment Location:    Type:  Abscess   Location:  Upper extremity   Upper extremity location: R axilla. Pre-procedure details:    Skin preparation:  Chloraprep Anesthesia (see MAR for exact dosages):    Anesthesia method:  Local infiltration   Local anesthetic:  Lidocaine 2% WITH epi Procedure details:    Incision types:  Single straight   Incision depth:  Dermal    Scalpel blade:  11   Drainage:  Purulent and bloody   Drainage amount:  Scant   Wound treatment:  Wound left open   Packing materials:  None Post-procedure details:    Patient tolerance of procedure:  Tolerated well, no immediate complications   (including critical care time)  Medications Ordered in ED Medications  Tdap (BOOSTRIX) injection 0.5 mL (0.5 mLs Intramuscular Given 03/31/18 1708)  lidocaine-EPINEPHrine (XYLOCAINE W/EPI) 2 %-1:200000 (PF) injection 10 mL (10 mLs Infiltration Given 03/31/18 1708)     Initial Impression / Assessment and Plan / ED Course  I have reviewed the triage vital signs and the nursing notes.  Pertinent labs & imaging results that were available  during my care of the patient were reviewed by me and considered in my medical decision making (see chart for details).  Patient with skin abscess to the right axilla amenable to incision and drainage.  Very small areas in the left axilla that are not large enough to warrant drainage.  Abscess was not large enough to warrant packing or drain,  wound recheck in 2 days. Encouraged home warm soaks and flushing.  Mild signs of cellulitis is surrounding skin will treat with keflex  Will d/c to home. Return precautions discussed  Final Clinical Impressions(s) / ED Diagnoses   Final diagnoses:  Abscess of axilla, right    ED Discharge Orders         Ordered    cephALEXin (KEFLEX) 500 MG capsule  4 times daily     03/31/18 1729           Dartha Lodge, PA-C 03/31/18 1742    Virgina Norfolk, DO 04/01/18 0309

## 2018-03-31 NOTE — Discharge Instructions (Addendum)
Your abscess was drained today, please take antibiotics as directed, keep the area covered as it may continue to use somewhat, and rinse under clean warm water 2-3 times daily to help promote healing.  Follow-up with your primary care doctor for a wound check, return if you develop worsening redness, pain or swelling surrounding the area, fevers or have increasing amounts of drainage.

## 2018-04-13 ENCOUNTER — Ambulatory Visit: Payer: Medicare Other

## 2018-05-28 ENCOUNTER — Ambulatory Visit
Admission: RE | Admit: 2018-05-28 | Discharge: 2018-05-28 | Disposition: A | Discharge status: Home / Self Care | Payer: Medicare Other | Source: Ambulatory Visit | Attending: Internal Medicine | Admitting: Internal Medicine

## 2018-05-28 ENCOUNTER — Ambulatory Visit: Payer: Medicare Other

## 2018-05-28 DIAGNOSIS — Z1231 Encounter for screening mammogram for malignant neoplasm of breast: Secondary | ICD-10-CM

## 2018-06-29 ENCOUNTER — Other Ambulatory Visit: Payer: Self-pay | Admitting: Internal Medicine

## 2018-06-29 DIAGNOSIS — Z13820 Encounter for screening for osteoporosis: Secondary | ICD-10-CM

## 2018-07-01 ENCOUNTER — Ambulatory Visit: Payer: Medicare Other | Admitting: Podiatry

## 2018-07-12 ENCOUNTER — Ambulatory Visit: Payer: Medicare Other | Admitting: Podiatry

## 2018-08-20 ENCOUNTER — Other Ambulatory Visit: Payer: Medicare Other

## 2018-08-23 ENCOUNTER — Other Ambulatory Visit: Payer: Medicare HMO

## 2018-08-27 ENCOUNTER — Ambulatory Visit
Admission: RE | Admit: 2018-08-27 | Discharge: 2018-08-27 | Disposition: A | Discharge status: Home / Self Care | Payer: Medicare HMO | Source: Ambulatory Visit | Attending: Internal Medicine | Admitting: Internal Medicine

## 2018-08-27 DIAGNOSIS — Z13820 Encounter for screening for osteoporosis: Secondary | ICD-10-CM

## 2019-02-02 ENCOUNTER — Encounter: Payer: Self-pay | Admitting: Neurology

## 2019-02-07 ENCOUNTER — Ambulatory Visit: Payer: Medicare Other | Admitting: Neurology

## 2019-02-08 ENCOUNTER — Encounter: Payer: Self-pay | Admitting: Neurology

## 2019-03-29 ENCOUNTER — Emergency Department (HOSPITAL_COMMUNITY): Payer: Medicare Other

## 2019-03-29 ENCOUNTER — Other Ambulatory Visit: Payer: Self-pay

## 2019-03-29 ENCOUNTER — Inpatient Hospital Stay (HOSPITAL_COMMUNITY)
Admission: EM | Admit: 2019-03-29 | Discharge: 2019-03-29 | DRG: 312 | Discharge status: Against Medical Advice | Payer: Medicare Other | Attending: Family Medicine | Admitting: Family Medicine

## 2019-03-29 DIAGNOSIS — Z8673 Personal history of transient ischemic attack (TIA), and cerebral infarction without residual deficits: Secondary | ICD-10-CM | POA: Diagnosis not present

## 2019-03-29 DIAGNOSIS — Z7982 Long term (current) use of aspirin: Secondary | ICD-10-CM | POA: Diagnosis not present

## 2019-03-29 DIAGNOSIS — F329 Major depressive disorder, single episode, unspecified: Secondary | ICD-10-CM | POA: Diagnosis present

## 2019-03-29 DIAGNOSIS — Z590 Homelessness: Secondary | ICD-10-CM

## 2019-03-29 DIAGNOSIS — F17210 Nicotine dependence, cigarettes, uncomplicated: Secondary | ICD-10-CM | POA: Diagnosis present

## 2019-03-29 DIAGNOSIS — Z79899 Other long term (current) drug therapy: Secondary | ICD-10-CM

## 2019-03-29 DIAGNOSIS — F419 Anxiety disorder, unspecified: Secondary | ICD-10-CM | POA: Diagnosis present

## 2019-03-29 DIAGNOSIS — J45909 Unspecified asthma, uncomplicated: Secondary | ICD-10-CM | POA: Diagnosis present

## 2019-03-29 DIAGNOSIS — M170 Bilateral primary osteoarthritis of knee: Secondary | ICD-10-CM | POA: Diagnosis present

## 2019-03-29 DIAGNOSIS — M7989 Other specified soft tissue disorders: Secondary | ICD-10-CM | POA: Diagnosis present

## 2019-03-29 DIAGNOSIS — I10 Essential (primary) hypertension: Secondary | ICD-10-CM | POA: Diagnosis present

## 2019-03-29 DIAGNOSIS — R55 Syncope and collapse: Secondary | ICD-10-CM | POA: Diagnosis present

## 2019-03-29 DIAGNOSIS — R079 Chest pain, unspecified: Secondary | ICD-10-CM | POA: Diagnosis present

## 2019-03-29 DIAGNOSIS — F1011 Alcohol abuse, in remission: Secondary | ICD-10-CM | POA: Diagnosis present

## 2019-03-29 DIAGNOSIS — Z20828 Contact with and (suspected) exposure to other viral communicable diseases: Secondary | ICD-10-CM | POA: Diagnosis present

## 2019-03-29 LAB — URINALYSIS, ROUTINE W REFLEX MICROSCOPIC
Bilirubin Urine: NEGATIVE
Glucose, UA: NEGATIVE mg/dL
Ketones, ur: NEGATIVE mg/dL
Nitrite: NEGATIVE
Protein, ur: NEGATIVE mg/dL
Specific Gravity, Urine: 1.006 (ref 1.005–1.030)
pH: 7 (ref 5.0–8.0)

## 2019-03-29 LAB — RAPID URINE DRUG SCREEN, HOSP PERFORMED
Amphetamines: NOT DETECTED
Barbiturates: NOT DETECTED
Benzodiazepines: NOT DETECTED
Cocaine: POSITIVE — AB
Opiates: NOT DETECTED
Tetrahydrocannabinol: NOT DETECTED

## 2019-03-29 LAB — BASIC METABOLIC PANEL
Anion gap: 11 (ref 5–15)
BUN: 13 mg/dL (ref 8–23)
CO2: 26 mmol/L (ref 22–32)
Calcium: 9.3 mg/dL (ref 8.9–10.3)
Chloride: 103 mmol/L (ref 98–111)
Creatinine, Ser: 1.06 mg/dL — ABNORMAL HIGH (ref 0.44–1.00)
GFR calc Af Amer: 60 mL/min — ABNORMAL LOW (ref >60)
GFR calc non Af Amer: 52 mL/min — ABNORMAL LOW (ref >60)
Glucose, Bld: 203 mg/dL — ABNORMAL HIGH (ref 70–99)
Potassium: 3.6 mmol/L (ref 3.5–5.1)
Sodium: 140 mmol/L (ref 135–145)

## 2019-03-29 LAB — MRSA PCR SCREENING: MRSA by PCR: NEGATIVE

## 2019-03-29 LAB — CBC
HCT: 40.4 % (ref 36.0–46.0)
Hemoglobin: 13.3 g/dL (ref 12.0–15.0)
MCH: 31.1 pg (ref 26.0–34.0)
MCHC: 32.9 g/dL (ref 30.0–36.0)
MCV: 94.6 fL (ref 80.0–100.0)
Platelets: 345 10*3/uL (ref 150–400)
RBC: 4.27 MIL/uL (ref 3.87–5.11)
RDW: 14.6 % (ref 11.5–15.5)
WBC: 7.1 10*3/uL (ref 4.0–10.5)
nRBC: 0 % (ref 0.0–0.2)

## 2019-03-29 LAB — TSH: TSH: 0.948 u[IU]/mL (ref 0.350–4.500)

## 2019-03-29 LAB — SARS CORONAVIRUS 2 (TAT 6-24 HRS): SARS Coronavirus 2: NEGATIVE

## 2019-03-29 LAB — CBG MONITORING, ED: Glucose-Capillary: 116 mg/dL — ABNORMAL HIGH (ref 70–99)

## 2019-03-29 LAB — TROPONIN I (HIGH SENSITIVITY)
Troponin I (High Sensitivity): 5 ng/L (ref <18)
Troponin I (High Sensitivity): 4 ng/L (ref <18)

## 2019-03-29 LAB — HEMOGLOBIN A1C
Hgb A1c MFr Bld: 5.9 % — ABNORMAL HIGH (ref 4.8–5.6)
Mean Plasma Glucose: 122.63 mg/dL

## 2019-03-29 LAB — ETHANOL: Alcohol, Ethyl (B): <10 mg/dL (ref <10)

## 2019-03-29 MED ORDER — SODIUM CHLORIDE 0.9 % IV BOLUS
1000.0000 mL | Freq: Once | INTRAVENOUS | Status: AC
  Administered 2019-03-29: 1000 mL via INTRAVENOUS

## 2019-03-29 MED ORDER — ACETAMINOPHEN 325 MG PO TABS: 650.0000 mg | ORAL_TABLET | Freq: Four times a day (QID) | ORAL | Status: DC | PRN

## 2019-03-29 MED ORDER — SODIUM CHLORIDE 0.9% FLUSH
3.0000 mL | Freq: Once | INTRAVENOUS | Status: AC
  Administered 2019-03-29: 3 mL via INTRAVENOUS

## 2019-03-29 MED ORDER — ASPIRIN 81 MG PO CHEW
324.0000 mg | CHEWABLE_TABLET | Freq: Once | ORAL | Status: AC
  Administered 2019-03-29: 324 mg via ORAL
  Filled 2019-03-29: qty 4

## 2019-03-29 MED ORDER — SODIUM CHLORIDE 0.9% FLUSH: 3.0000 mL | Freq: Two times a day (BID) | INTRAVENOUS | Status: DC

## 2019-03-29 MED ORDER — NITROGLYCERIN 0.4 MG SL SUBL: 0.4000 mg | SUBLINGUAL_TABLET | SUBLINGUAL | Status: DC | PRN

## 2019-03-29 MED ORDER — OLANZAPINE 2.5 MG PO TABS
2.5000 mg | ORAL_TABLET | Freq: Every day | ORAL | Status: DC
  Filled 2019-03-29: qty 1

## 2019-03-29 MED ORDER — ENOXAPARIN SODIUM 40 MG/0.4ML ~~LOC~~ SOLN: 40.0000 mg | SUBCUTANEOUS | Status: DC

## 2019-03-29 MED ORDER — LISINOPRIL 20 MG PO TABS
20.0000 mg | ORAL_TABLET | Freq: Every day | ORAL | Status: DC
  Administered 2019-03-29: 20 mg via ORAL
  Filled 2019-03-29: qty 1

## 2019-03-29 MED ORDER — LISINOPRIL-HYDROCHLOROTHIAZIDE 20-25 MG PO TABS: 1.0000 | ORAL_TABLET | Freq: Every day | ORAL | Status: DC

## 2019-03-29 MED ORDER — POLYETHYLENE GLYCOL 3350 17 G PO PACK: 17.0000 g | PACK | Freq: Every day | ORAL | Status: DC | PRN

## 2019-03-29 MED ORDER — HYDROCHLOROTHIAZIDE 25 MG PO TABS
25.0000 mg | ORAL_TABLET | Freq: Every day | ORAL | Status: DC
  Administered 2019-03-29: 25 mg via ORAL
  Filled 2019-03-29: qty 1

## 2019-03-29 MED ORDER — ACETAMINOPHEN 650 MG RE SUPP: 650.0000 mg | Freq: Four times a day (QID) | RECTAL | Status: DC | PRN

## 2019-03-29 NOTE — Progress Notes (Addendum)
Patient signed Against medical advice papers. Patient appears upset about husband not being able to stay due to [facility] and patient refusing care at this time. Attempted to talk with patient about staying to finish her testing. Patient very adamant about leaving. Iv removed from Right hand. Telemetry taken off. Left with husband at side. MD made aware.   2045- CT scan discontinued because patient is no longer here, left AMA, CT request for the order to be discontinued.

## 2019-03-29 NOTE — ED Triage Notes (Addendum)
Pt [facility]- pt reports for the last 3 days she has felt generalized weakness. Pt is alert and ox4. Pt states she has been able to ambulate just "doesn't feel well".

## 2019-03-29 NOTE — Progress Notes (Signed)
[facility]PT[facility] Interim Progress Note  Received page from RN that patient was leaving AMA. Went up immediatly to floor. By the time of our arrival patient had already left unit and signed AMA papers. Was unable to discuss risks and benefits as patient had already left unit. We did look around hallway and patient was nowhere to be found.   Dr. Andria Frames made aware   Caroline More, DO 03/29/2019, 9:47 PM PGY-3, [place] [facility] pager [phone]

## 2019-03-29 NOTE — Plan of Care (Signed)
Initiation of care plan  Problem: Education: Goal: Knowledge of General Education information will improve Description: Including pain rating scale, medication(s)/side effects and non-pharmacologic comfort measures Outcome: Progressing   Problem: Health Behavior/Discharge Planning: Goal: Ability to manage health-related needs will improve Outcome: Progressing   Problem: Clinical Measurements: Goal: Ability to maintain clinical measurements within normal limits will improve Outcome: Progressing Goal: Will remain free from infection Outcome: Progressing Goal: Diagnostic test results will improve Outcome: Progressing Goal: Respiratory complications will improve Outcome: Progressing Goal: Cardiovascular complication will be avoided Outcome: Progressing   Problem: Activity: Goal: Risk for activity intolerance will decrease Outcome: Progressing   Problem: Nutrition: Goal: Adequate nutrition will be maintained Outcome: Progressing   Problem: Coping: Goal: Level of anxiety will decrease Outcome: Progressing   Problem: Elimination: Goal: Will not experience complications related to bowel motility Outcome: Progressing Goal: Will not experience complications related to urinary retention Outcome: Progressing   Problem: Pain Managment: Goal: General experience of comfort will improve Outcome: Progressing   Problem: Safety: Goal: Ability to remain free from injury will improve Outcome: Progressing   Problem: Skin Integrity: Goal: Risk for impaired skin integrity will decrease Outcome: Progressing

## 2019-03-29 NOTE — Progress Notes (Signed)
Patient refusing to continue receive care. She is also upset husband can't spend the night in the room. Patient is walking around in room gathering personal belongings said she does not want stay and is leaving. Husband is at bedside agreeing with her decision assisting her getting belongings. We  discussed with her the need to stay to provide further care but she does not agree. MD notified patient allowed IV to be removed and and signed AMA papers. She then quickly walked out with husband. MD shortly arrived to speak with patient but she already left.

## 2019-03-29 NOTE — ED Provider Notes (Signed)
[facility] EMERGENCY DEPARTMENT Provider Note   CSN: [id number] Arrival date & time: 03/29/19  6295     History   Chief Complaint Chief Complaint  Patient presents with  . Fatigue    HPI Deborah [name] is a 73 y.o. female.     HPI  73 year old female presents with syncope and chest pain.  States that when she woke up around 6 AM she had chest pain.  She has a hard time describing what it feels like but it is much better than when it started.  She still rates it as a 9 out of 10.  Some shortness of breath as well.  While standing in line at the food kitchen, she felt lightheaded states she had some palpitations.  Lasted about 15 minutes.  She then briefly passed out while sitting.  She states she still has a little bit of palpitations but not as bad.  She felt like her heart was going fast.  The chest pain is also better.  She has chronic swelling in her right leg for over 1 year but this is unchanged.  No prior DVT.  Past Medical History:  Diagnosis Date  . Alcohol abuse, in remission   . Anxiety   . Asthma   . Cocaine abuse (HCC)   . CVA (cerebral vascular accident) (HCC)   . Degenerative joint disease    back  . Fracture of hand   . Headache, chronic daily   . Hematuria   . Hypertension   . Inadequate material resources   . Mental/behavioral problem   . S/P TAH-BSO   . Tobacco abuse   . Trichomonal vaginitis     Patient Active Problem List   Diagnosis Date Noted  . Loss of consciousness (HCC) 07/06/2017  . Loss of weight 04/30/2016  . Lower back pain 03/04/2016  . Atopic dermatitis 10/10/2015  . History of CVA (cerebrovascular accident) 09/05/2015  . Health maintenance examination 10/29/2011  . Primary osteoarthritis of both knees 02/28/2011  . Depression 08/10/2009  . DJD (degenerative joint disease) 07/03/2006  . Essential hypertension 06/08/2006  . ASTHMA 06/08/2006  . TAH/BSO, HX OF 06/08/2006    Past Surgical History:  Procedure  Laterality Date  . ABDOMINAL HYSTERECTOMY  04/13/1989   Per Dr. Lilian Kapur. Abdominal hysterectomy and bilateral salpingo-oophorectomy  . FOOT SURGERY       OB History   No obstetric history on file.      Home Medications    Prior to Admission medications   Medication Sig Start Date End Date Taking? Authorizing Provider  lisinopril-hydrochlorothiazide (PRINZIDE,ZESTORETIC) 20-25 MG tablet Take 1 tablet by mouth daily. 03/04/16  Yes Arnetha Courser, MD  aspirin 81 MG tablet Take 1 tablet (81 mg total) by mouth daily. Patient not taking: Reported on 07/06/2017 03/04/16   Arnetha Courser, MD  FLUoxetine (PROZAC) 20 MG capsule Take 1 capsule (20 mg total) by mouth daily. Patient not taking: Reported on 07/06/2017 03/04/16   Arnetha Courser, MD  OLANZapine (ZYPREXA) 2.5 MG tablet Take 2.5 mg by mouth at bedtime.    [provider]    Family History Family History  Problem Relation Age of Onset  . Cancer Father        colon cancer  . Cancer Paternal Aunt        colon cancer  . Breast cancer Neg Hx     Social History Social History   Tobacco Use  . Smoking status: Former Smoker    Packs/day:  0.10    Types: Cigarettes    Quit date: 02/25/2011    Years since quitting: 8.0  . Smokeless tobacco: Former Systems developer  . Tobacco comment: quit x 2 months.  Substance Use Topics  . Alcohol use: No    Alcohol/week: 0.0 standard drinks  . Drug use: No     Allergies   Patient has no known allergies.   Review of Systems Review of Systems  Constitutional: Negative for fever.  Respiratory: Positive for shortness of breath. Negative for cough.   Cardiovascular: Positive for chest pain and palpitations.  Gastrointestinal: Negative for abdominal pain.  Neurological: Positive for syncope, weakness (generalized) and light-headedness. Negative for numbness and headaches.  All other systems reviewed and are negative.    Physical Exam Updated Vital Signs BP (!) 221/93   Pulse 74   Temp 97.7  F (36.5 C) (Oral)   Resp 19   SpO2 100%   Physical Exam Vitals signs and nursing note reviewed.  Constitutional:      General: She is not in acute distress.    Appearance: She is well-developed. She is not ill-appearing or diaphoretic.  HENT:     Head: Normocephalic and atraumatic.     Right Ear: External ear normal.     Left Ear: External ear normal.     Nose: Nose normal.  Eyes:     General:        Right eye: No discharge.        Left eye: No discharge.  Cardiovascular:     Rate and Rhythm: Normal rate and regular rhythm.     Heart sounds: Normal heart sounds. No murmur.  Pulmonary:     Effort: Pulmonary effort is normal.     Breath sounds: Normal breath sounds.  Abdominal:     Palpations: Abdomen is soft.     Tenderness: There is no abdominal tenderness.  Skin:    General: Skin is warm and dry.  Neurological:     Mental Status: She is alert.     Comments: 5/5 strength in all 4 extremities. Clear speech  Psychiatric:        Mood and Affect: Mood is not anxious.      ED Treatments / Results  Labs (all labs ordered are listed, but only abnormal results are displayed) Labs Reviewed  BASIC METABOLIC PANEL - Abnormal; Notable for the following components:      Result Value   Glucose, Bld 203 (*)    Creatinine, Ser 1.06 (*)    GFR calc non Af Amer 52 (*)    GFR calc Af Amer 60 (*)    All other components within normal limits  CBG MONITORING, ED - Abnormal; Notable for the following components:   Glucose-Capillary 116 (*)    All other components within normal limits  SARS CORONAVIRUS 2 (TAT 6-24 HRS)  CBC  URINALYSIS, ROUTINE W REFLEX MICROSCOPIC  RAPID URINE DRUG SCREEN, HOSP PERFORMED  TROPONIN I (HIGH SENSITIVITY)  TROPONIN I (HIGH SENSITIVITY)    EKG EKG Interpretation  Date/Time:  Tuesday March 29 2019 09:50:33 EDT Ventricular Rate:  68 PR Interval:  136 QRS Duration: 82 QT Interval:  414 QTC Calculation: 440 R Axis:   22 Text Interpretation:   Normal sinus rhythm Right atrial enlargement Minimal voltage criteria for LVH, may be normal variant ( R in aVL ) Septal infarct , age undetermined Abnormal ECG no significant change since 2019 Confirmed by Sherwood Gambler [phone]) on 03/29/2019 10:31:27 AM  Radiology Dg Chest Portable 1 View  Result Date: 03/29/2019 CLINICAL DATA:  Generalized weakness for 3 days. History of hypertension, asthma, stroke and cocaine and ethanol abuse. EXAM: PORTABLE CHEST 1 VIEW COMPARISON:  10/26/2017 FINDINGS: Cardiac silhouette is top-normal in size. No mediastinal no hilar masses. No evidence of adenopathy. Clear lungs.  No pleural effusion or pneumothorax. Skeletal structures are grossly intact. IMPRESSION: No active disease. Electronically Signed   By: Amie Portlandavid  Ormond M.D.   On: 03/29/2019 11:08    Procedures Procedures (including critical care time)  Medications Ordered in ED Medications  nitroGLYCERIN (NITROSTAT) SL tablet 0.4 mg (has no administration in time range)  lisinopril (ZESTRIL) tablet 20 mg (20 mg Oral Given 03/29/19 1203)    And  hydrochlorothiazide (HYDRODIURIL) tablet 25 mg (25 mg Oral Given 03/29/19 1203)  sodium chloride flush (NS) 0.9 % injection 3 mL (3 mLs Intravenous Given 03/29/19 1201)  sodium chloride 0.9 % bolus 1,000 mL (1,000 mLs Intravenous New Bag/Given 03/29/19 1202)  aspirin chewable tablet 324 mg (324 mg Oral Given 03/29/19 1203)     Initial Impression / Assessment and Plan / ED Course  I have reviewed the triage vital signs and the nursing notes.  Pertinent labs & imaging results that were available during my care of the patient were reviewed by me and considered in my medical decision making (see chart for details).        Patient's chest pain has resolved on its own.  Otherwise, with her significant hypertension and atypical chest pain and age, I think she will need to be admitted for observation.  Unclear what caused the syncope, though in the setting of chest pain  she will need further eval.  I doubt PE at this time.  Family practice to admit.  She does have significant hypertension but her presentation is not consistent with dissection at this time.  Deborah [name] was evaluated in Emergency Department on 03/29/2019 for the symptoms described in the history of present illness. She was evaluated in the context of the global COVID-19 pandemic, which necessitated consideration that the patient might be at risk for infection with the SARS-CoV-2 virus that causes COVID-19. Institutional protocols and algorithms that pertain to the evaluation of patients at risk for COVID-19 are in a state of rapid change based on information released by regulatory bodies including the CDC and federal and state organizations. These policies and algorithms were followed during the patient's care in the ED.   Final Clinical Impressions(s) / ED Diagnoses   Final diagnoses:  Nonspecific chest pain  Syncope and collapse    ED Discharge Orders    None       Pricilla LovelessGoldston, Mazikeen Hehn, MD 03/29/19 1258

## 2019-03-29 NOTE — H&P (Addendum)
[facility] Admission History and Physical Service Pager: [phone]  Patient name: Deborah [name] Medical record number: [id number] Date of birth: 21-Oct-1945 Age: 73 y.o. Gender: female  Primary Care Provider: Patient, No Pcp Per Consultants: None Code Status: Full Preferred Emergency Contact: Lauro Regulus (friend) [phone]  Chief Complaint: "I Fainted"  Assessment and Plan: [name] is a 73 y.o. female presenting with syncope and chest pain . PMH is significant for HTN. Possible hx of CVA, Cocaine abuse, ETOH abuse  Syncope undetermined etiology Possible syncopal event although poor historian.  Unlikely stroke or seizure activity as no prior h/o seizures or activity consistent with seizures and no post-ictal state. Friend reports that her speech was slightly different than normal but denies any drooping of mouth.  No neurological deficits at time of exam to suggest stroke. Friend denied hitting her head. Is not on blood thinners. Questionable h/o stroke per chart review, patient denies. CT head pending. Considered cardiac etiology although EKG without ischemic changes or arrhythmia, troponins negative x2, and lack of chest pain or shortness of breath though slight murmur appreciated on exam. Last ECHO(07/06/2017) LVEF 65-70% GD1DD. Of note, UDS positive for cocaine so could have had sudden vasoconstriction though no current evidence of neurological or cardiac ischemia causing stroke or arrhythmia as mentioned. Orthostatic hypotension considered as patient with possible prodromal symptoms with patient feeling "feverish" prior to LOC. Blood pressures hypertensive in ED with negative orthostatics although were obtained after receiving 1L NS bolus. Regardless, labs and urine do not show evidence of dehydration. No infectious symptoms to suggest meningitis or other infectious process. Pt states that she does not drink often but at end of interview was asking for a  beer, so considered ETOH intoxication as a cause. Will obtain EtOH level. -Admit to Med Tele, attending Dr. Nori Riis -vital signs per unit -CT head without contrast -ECHO -repeat ECG in am -CMP and CBC in am -HbA1C in am -ETOH level -CIWA protocol -PT/OT - TSH -oral hydration, s/p 1L NS bolus  HTN BP on admission 166/86-221-93, has history of HTN and had not taken meds in three days. On lisinopril-HCTZ 20-25mg  daily at home. UDS also positive for cocaine. -Continue home meds -encourage cocaine cessation  Homelessness Pt states she lives with her "friend." -consult Social Work  ?H/o stroke Noted per chart review though was reported by patient when she was living in [place] prior to moving to [facility]. Had reported residual slow speech and balance problems afterwards at that time. On admission today, patient denies ever having had a stroke.  Was previously on ASA 81mg . CT Head 06/2017 with moderate small vessel disease.  Depression Unclear if h/o mood disorder, on zyprexa at home. Per chart review, has also previously been on seroquel and prozac. Previously seen at [facility]. Mood stable at time of admission. - continue home zyprexa  Asthma On no meds at home. Normal WOB on RA, appropriately saturated. - monitor   Alcohol use disorder Pt states that she does not drink often but at end of interview was asking for a beer, so considered ETOH intoxication. Does have h/o alcohol use d/o in remission per chart review. Patient unable to state last drink.  - EtOH level - CIWA  FEN/GI:  -Cardiac diet  Prophylaxis:  -Lovenox   Disposition: Admit Med Tele, Attending Dr Nori Riis  History of Present Illness:  Deborah [name] is a 73 y.o. female presenting s/p syncopal episode.  Pt is unsure what brought her to hospital.  History  obtained from patient and friend.  He reports that she was sitting on a barrel when she suddenly leaned on him and he couldn't arouse her.  He immediately called 911. Pt reports  that she passed out and cannot recall the events leading up to the episode but reports she "felt feverish" right before she passed out. She does remember sitting on a barrel outside [employer]. Friend states that she had some movement of upper extremities but lasted a few seconds and was not jerky or whole body movements.  Does not appear to be a seizure like activity. Pt denies any current chest pain, shortness of breath, n/v or abdo pain.  She has had no sick contacts. Reports MI last year but there is no documentation of this.  Poor historian.  She denies any chest pain, shortness of breath, n/v or abdominal pain.  No dysuria, hematuria, no diarrhea or constipation.    In the ED she was afebrile, hypertensive 166/86-199/112, HR 74, RR 16-20. She was given  Zestril 20mg  and Hydrodiuril 25mg  X1, ASA 324mg  x1, and N/S 1L bolus.  She was ordered Nitro but had not been given as patient chest pain resolved.  Chest xray negative for active disease.  Labs sig for Cr 1.06, Glu 203, Trop 5.  Review Of Systems: Per HPI with the following additions:   Review of Systems  HENT: Negative for tinnitus.   Eyes: Positive for blurred vision.  Respiratory: Negative for shortness of breath.   Gastrointestinal: Negative for abdominal pain, blood in stool, constipation, diarrhea, nausea and vomiting.  Genitourinary: Negative for hematuria.  Neurological: Negative for headaches.    Patient Active Problem List   Diagnosis Date Noted  . Syncope 03/29/2019  . Loss of consciousness (HCC) 07/06/2017  . Loss of weight 04/30/2016  . Lower back pain 03/04/2016  . Atopic dermatitis 10/10/2015  . History of CVA (cerebrovascular accident) 09/05/2015  . Health maintenance examination 10/29/2011  . Primary osteoarthritis of both knees 02/28/2011  . Depression 08/10/2009  . DJD (degenerative joint disease) 07/03/2006  . Essential hypertension 06/08/2006  . ASTHMA 06/08/2006  . TAH/BSO, HX OF 06/08/2006    Past  Medical History: Past Medical History:  Diagnosis Date  . Alcohol abuse, in remission   . Anxiety   . Asthma   . Cocaine abuse (HCC)   . CVA (cerebral vascular accident) (HCC)   . Degenerative joint disease    back  . Fracture of hand   . Headache, chronic daily   . Hematuria   . Hypertension   . Inadequate material resources   . Mental/behavioral problem   . S/P TAH-BSO   . Tobacco abuse   . Trichomonal vaginitis     Past Surgical History: Past Surgical History:  Procedure Laterality Date  . ABDOMINAL HYSTERECTOMY  04/13/1989   Per Dr. 06/10/2006. Abdominal hysterectomy and bilateral salpingo-oophorectomy  . FOOT SURGERY      Social History: Social History   Tobacco Use  . Smoking status: Former Smoker    Packs/day: 0.10    Types: Cigarettes    Quit date: 02/25/2011    Years since quitting: 8.0  . Smokeless tobacco: Former 04/15/1989  . Tobacco comment: quit x 2 months.  Substance Use Topics  . Alcohol use: No    Alcohol/week: 0.0 standard drinks  . Drug use: No   Additional social history: Lives with friend.  Smokes 1-2 cigs daily, denies any cocaine, heroine, THC use Please also refer to relevant sections of  EMR.  Family History: Family History  Problem Relation Age of Onset  . Cancer Father        colon cancer  . Cancer Paternal Aunt        colon cancer  . Breast cancer Neg Hx     Allergies and Medications: No Known Allergies No current facility-administered medications on file prior to encounter.    Current Outpatient Medications on File Prior to Encounter  Medication Sig Dispense Refill  . lisinopril-hydrochlorothiazide (PRINZIDE,ZESTORETIC) 20-25 MG tablet Take 1 tablet by mouth daily. 90 tablet 3  . aspirin 81 MG tablet Take 1 tablet (81 mg total) by mouth daily. (Patient not taking: Reported on 07/06/2017) 30 tablet 11  . OLANZapine (ZYPREXA) 2.5 MG tablet Take 2.5 mg by mouth at bedtime.      Objective: BP (!) 176/86 (BP Location: Right Arm)    Pulse 68   Temp 97.6 F (36.4 C) (Oral)   Resp 17   Ht 5\' 4"  (1.626 m)   Wt 61.4 kg   SpO2 100%   BMI 23.23 kg/m  Exam: General: Alert, in no acute distress Eyes: PERRLA, EOMs intact, mucus membranes moist ENTM: non tender, no lymphadenopathy Neck: supple and non tender Cardiovascular: RRR, faint murmur LUS border Respiratory: CTAB, no crackles, no rhonchi Gastrointestinal: soft, non tender, non distended, BS present MSK: moves all extremities, 5/5 strength bilaterally and equal Derm: warm and dry Neuro: orientated x3, CN II-XII intact. Speech normal.  Intact symmetric sensation to light touch of face and extremities bilaterally.  Hearing grossly intact bilaterally.  Tongue protrudes normally with no deviation.  Shoulder shrug, smile symmetric.  Psych: pleasant and cooperative  Labs and Imaging: CBC BMET  Recent Labs  Lab 03/29/19 0954  WBC 7.1  HGB 13.3  HCT 40.4  PLT 345   Recent Labs  Lab 03/29/19 0954  NA 140  K 3.6  CL 103  CO2 26  BUN 13  CREATININE 1.06*  GLUCOSE 203*  CALCIUM 9.3     EKG:  Normal sinus rhythm Right atrial enlargement Minimal voltage criteria for LVH, may be normal variant ( R in aVL ) Septal infarct , age undetermined Abnormal ECG no significant change since 2019 Confirmed by Pricilla LovelessGoldston, Scott [phone]([id number]) on 03/29/2019 10:31:27 AM No change from prior.  Dg Chest Portable 1 View  Result Date: 03/29/2019 CLINICAL DATA:  Generalized weakness for 3 days. History of hypertension, asthma, stroke and cocaine and ethanol abuse. EXAM: PORTABLE CHEST 1 VIEW COMPARISON:  10/26/2017 FINDINGS: Cardiac silhouette is top-normal in size. No mediastinal no hilar masses. No evidence of adenopathy. Clear lungs.  No pleural effusion or pneumothorax. Skeletal structures are grossly intact. IMPRESSION: No active disease. Electronically Signed   By: Amie Portlandavid  Ormond M.D.   On: 03/29/2019 11:08   Dana AllanWalsh, Tanya, MD 03/29/2019, 5:19 PM PGY-1, [facility] FPTS Intern pager: [phone], text pages welcome  FPTS Upper-Level Resident Addendum   I have independently interviewed and examined the patient. I have discussed the above with the original author and agree with their documentation. My edits for correction/addition/clarification are in green. Please see also any attending notes.    Ellwood DenseAlison Annsleigh Dragoo, DO PGY-3, [facility] 03/29/2019 6:24 PM  FPTS Service pager: [phone] (text pages welcome through [facility])

## 2019-03-29 NOTE — ED Notes (Signed)
Pt ambulatory to and from restroom with independent steady gait. 

## 2019-03-30 NOTE — Discharge Summary (Signed)
[facility] Discharge Summary  Patient name: Deborah [name] Medical record number: [id number] Date of birth: [birth year] Age: 73 y.o. Gender: female Date of Admission: 03/29/2019  Date of Discharge: 03-29-19 Admitting Physician: Dickie La, MD  Primary Care Provider: Patient, No Pcp Per Consultants: None  Indication for Hospitalization: syncope  Discharge Diagnoses/Problem List:  Active Problems:   Syncope  Disposition: patient left AMA   Discharge Condition: stable   Discharge Exam:  Unable to assess patient as she left the hospital before we were able to arrive to the floor  Below is physical exam performed by Drs. Volanda Napoleon and Rumball at time of admission.  General: Alert, in no acute distress Eyes: PERRLA, EOMs intact, mucus membranes moist ENTM: non tender, no lymphadenopathy Neck: supple and non tender Cardiovascular: RRR, faint murmur LUS border Respiratory: CTAB, no crackles, no rhonchi Gastrointestinal: soft, non tender, non distended, BS present MSK: moves all extremities, 5/5 strength bilaterally and equal Derm: warm and dry Neuro: orientated x3, CN II-XII intact. Speech normal. Intact symmetric sensation to light touch of face and extremities bilaterally. Hearing grossly intact bilaterally. Tongue protrudes normally with no deviation. Shoulder shrug, smile symmetric.  Psych: pleasant and cooperative  Brief Hospital Course:  Deborah [name] is a 74 y.o. female with history of HTN, ?CVA, cocaine and ETOH abuse who presented after a syncopal event. Patient underwent CXR with no abnormal findings. Plan for treatment included CT head that was not able to be obtained as patient left the hospital. Patient also had EKG without ischemic changes nor arrhythmia and had negative troponin. UDS + for cocaine. Patient had negative orthostatic vitals in ED and was noted to be hypertensive with otherwise normal vitals. While here, patient received ASA 324mg ,  lisinopril 20mg  and hydrodiuril 25.   Per nursing staff, patient became upset when her partner could not stay in the hospital with her and announced that she was leaving the hospital AMA. She requested AMA paperwork, signed the AMA documents and left the facility before Dr. Tammi Klippel and myself could arrive to the floor after receiving a page from nursing.   Issues for Follow Up:  1. Head CT to rule out intracranial etiologies for patient's LOC.  2. Echocardiogram in order to rule out cardiac causes for LOC.  Significant Procedures: none  Significant Labs and Imaging:  Recent Labs  Lab 03/29/19 0954  WBC 7.1  HGB 13.3  HCT 40.4  PLT 345   Recent Labs  Lab 03/29/19 0954  NA 140  K 3.6  CL 103  CO2 26  GLUCOSE 203*  BUN 13  CREATININE 1.06*  CALCIUM 9.3    Hemoglobin a1c- 5.9 etoh <10 TSH 0.948 mrsa neg U/A:  Small leuks, moderate hgb, rare bacteria   Results/Tests Pending at Time of Discharge:    Discharge Medications:  Allergies as of 03/29/2019   No Known Allergies     Medication List    ASK your doctor about these medications   aspirin 81 MG tablet Take 1 tablet (81 mg total) by mouth daily.   lisinopril-hydrochlorothiazide 20-25 MG tablet Commonly known as: ZESTORETIC Take 1 tablet by mouth daily.   OLANZapine 2.5 MG tablet Commonly known as: ZYPREXA Take 2.5 mg by mouth at bedtime.       Stark Klein, MD 03/30/2019, 3:41 AM PGY-1, [place]

## 2019-08-10 ENCOUNTER — Ambulatory Visit: Payer: Medicare Other | Admitting: Orthopaedic Surgery

## 2020-07-30 ENCOUNTER — Encounter (HOSPITAL_COMMUNITY): Payer: Self-pay | Admitting: Emergency Medicine

## 2020-07-30 ENCOUNTER — Emergency Department (HOSPITAL_COMMUNITY)
Admission: EM | Admit: 2020-07-30 | Discharge: 2020-07-30 | Disposition: A | Discharge status: Home / Self Care | Payer: Medicare Other | Attending: Emergency Medicine | Admitting: Emergency Medicine

## 2020-07-30 DIAGNOSIS — Z5321 Procedure and treatment not carried out due to patient leaving prior to being seen by health care provider: Secondary | ICD-10-CM | POA: Insufficient documentation

## 2020-07-30 DIAGNOSIS — R1031 Right lower quadrant pain: Secondary | ICD-10-CM | POA: Diagnosis not present

## 2020-07-30 LAB — CBC
HCT: 42.2 % (ref 36.0–46.0)
Hemoglobin: 13.5 g/dL (ref 12.0–15.0)
MCH: 31 pg (ref 26.0–34.0)
MCHC: 32 g/dL (ref 30.0–36.0)
MCV: 97 fL (ref 80.0–100.0)
Platelets: 342 10*3/uL (ref 150–400)
RBC: 4.35 MIL/uL (ref 3.87–5.11)
RDW: 14.1 % (ref 11.5–15.5)
WBC: 7.9 10*3/uL (ref 4.0–10.5)
nRBC: 0 % (ref 0.0–0.2)

## 2020-07-30 LAB — COMPREHENSIVE METABOLIC PANEL
ALT: 13 U/L (ref 0–44)
AST: 18 U/L (ref 15–41)
Albumin: 4 g/dL (ref 3.5–5.0)
Alkaline Phosphatase: 87 U/L (ref 38–126)
Anion gap: 10 (ref 5–15)
BUN: 10 mg/dL (ref 8–23)
CO2: 26 mmol/L (ref 22–32)
Calcium: 9.9 mg/dL (ref 8.9–10.3)
Chloride: 104 mmol/L (ref 98–111)
Creatinine, Ser: 0.92 mg/dL (ref 0.44–1.00)
GFR, Estimated: >60 mL/min (ref >60)
Glucose, Bld: 130 mg/dL — ABNORMAL HIGH (ref 70–99)
Potassium: 4.4 mmol/L (ref 3.5–5.1)
Sodium: 140 mmol/L (ref 135–145)
Total Bilirubin: 1.2 mg/dL (ref 0.3–1.2)
Total Protein: 7.4 g/dL (ref 6.5–8.1)

## 2020-07-30 LAB — LIPASE, BLOOD: Lipase: 23 U/L (ref 11–51)

## 2020-07-30 NOTE — ED Notes (Signed)
Pt called several times for vitals in the past few hours.

## 2020-07-30 NOTE — ED Triage Notes (Signed)
Pt arrives via [facility] for c/o RLQ pain x1 hour, states it "feels like a ball" pt denies n/v/d.

## 2020-12-01 ENCOUNTER — Emergency Department (HOSPITAL_COMMUNITY): Payer: Medicare Other

## 2020-12-01 ENCOUNTER — Encounter (HOSPITAL_COMMUNITY): Payer: Self-pay | Admitting: Emergency Medicine

## 2020-12-01 ENCOUNTER — Other Ambulatory Visit: Payer: Self-pay

## 2020-12-01 ENCOUNTER — Emergency Department (HOSPITAL_COMMUNITY)
Admission: EM | Admit: 2020-12-01 | Discharge: 2020-12-02 | Disposition: A | Discharge status: Home / Self Care | Payer: Medicare Other | Attending: Emergency Medicine | Admitting: Emergency Medicine

## 2020-12-01 DIAGNOSIS — J45909 Unspecified asthma, uncomplicated: Secondary | ICD-10-CM | POA: Insufficient documentation

## 2020-12-01 DIAGNOSIS — Y92009 Unspecified place in unspecified non-institutional (private) residence as the place of occurrence of the external cause: Secondary | ICD-10-CM | POA: Diagnosis not present

## 2020-12-01 DIAGNOSIS — R079 Chest pain, unspecified: Secondary | ICD-10-CM | POA: Diagnosis not present

## 2020-12-01 DIAGNOSIS — Z7982 Long term (current) use of aspirin: Secondary | ICD-10-CM | POA: Diagnosis not present

## 2020-12-01 DIAGNOSIS — W010XXA Fall on same level from slipping, tripping and stumbling without subsequent striking against object, initial encounter: Secondary | ICD-10-CM | POA: Diagnosis not present

## 2020-12-01 DIAGNOSIS — Z87891 Personal history of nicotine dependence: Secondary | ICD-10-CM | POA: Diagnosis not present

## 2020-12-01 DIAGNOSIS — R0789 Other chest pain: Secondary | ICD-10-CM

## 2020-12-01 DIAGNOSIS — I10 Essential (primary) hypertension: Secondary | ICD-10-CM | POA: Diagnosis not present

## 2020-12-01 DIAGNOSIS — R030 Elevated blood-pressure reading, without diagnosis of hypertension: Secondary | ICD-10-CM

## 2020-12-01 DIAGNOSIS — W19XXXA Unspecified fall, initial encounter: Secondary | ICD-10-CM

## 2020-12-01 DIAGNOSIS — Z79899 Other long term (current) drug therapy: Secondary | ICD-10-CM | POA: Diagnosis not present

## 2020-12-01 LAB — COMPREHENSIVE METABOLIC PANEL
ALT: 12 U/L (ref 0–44)
AST: 18 U/L (ref 15–41)
Albumin: 3.5 g/dL (ref 3.5–5.0)
Alkaline Phosphatase: 77 U/L (ref 38–126)
Anion gap: 7 (ref 5–15)
BUN: 19 mg/dL (ref 8–23)
CO2: 29 mmol/L (ref 22–32)
Calcium: 9.4 mg/dL (ref 8.9–10.3)
Chloride: 105 mmol/L (ref 98–111)
Creatinine, Ser: 1.06 mg/dL — ABNORMAL HIGH (ref 0.44–1.00)
GFR, Estimated: 54 mL/min — ABNORMAL LOW (ref >60)
Glucose, Bld: 149 mg/dL — ABNORMAL HIGH (ref 70–99)
Potassium: 3.5 mmol/L (ref 3.5–5.1)
Sodium: 141 mmol/L (ref 135–145)
Total Bilirubin: 0.5 mg/dL (ref 0.3–1.2)
Total Protein: 6.2 g/dL — ABNORMAL LOW (ref 6.5–8.1)

## 2020-12-01 LAB — CBC
HCT: 39.8 % (ref 36.0–46.0)
Hemoglobin: 12.5 g/dL (ref 12.0–15.0)
MCH: 31.3 pg (ref 26.0–34.0)
MCHC: 31.4 g/dL (ref 30.0–36.0)
MCV: 99.5 fL (ref 80.0–100.0)
Platelets: 297 10*3/uL (ref 150–400)
RBC: 4 MIL/uL (ref 3.87–5.11)
RDW: 13.7 % (ref 11.5–15.5)
WBC: 4.3 10*3/uL (ref 4.0–10.5)
nRBC: 0 % (ref 0.0–0.2)

## 2020-12-01 LAB — TROPONIN I (HIGH SENSITIVITY)
Troponin I (High Sensitivity): 4 ng/L (ref <18)
Troponin I (High Sensitivity): 5 ng/L (ref <18)

## 2020-12-01 NOTE — ED Provider Notes (Addendum)
[facility] EMERGENCY DEPARTMENT Provider Note   CSN: [id number] Arrival date & time: 12/01/20  1921     History Chief Complaint  Patient presents with   Deborah [name]    Jaleesa Cervi is a 75 y.o. female.  Patient presents s/p fall at home, states tripped. Denies faintness or dizziness prior to fall. EMS notes in route patient c/o mild mid chest soreness. Pt unsure if contusion. Denies chest pain or discomfort prior to fall. No recent exertional chest pain or unusual doe or fatigue. No pleuritic chest pain. Denies cough or uri symptoms. No fever or chills. Denies head injury or headache. No neck or back pain. Denies palpitations. No abd pain or nvd. No melena, rectal bleeding or recent blood loss. Denies change in meds or new meds. Denies extremity pain or injury. No gu c/o.   The history is provided by the patient and the EMS personnel.  Fall Pertinent negatives include no chest pain, no abdominal pain, no headaches and no shortness of breath.      Past Medical History:  Diagnosis Date   Alcohol abuse, in remission    Anxiety    Asthma    Cocaine abuse (HCC)    CVA (cerebral vascular accident) (HCC)    Degenerative joint disease    back   Fracture of hand    Headache, chronic daily    Hematuria    Hypertension    Inadequate material resources    Mental/behavioral problem    S/P TAH-BSO    Tobacco abuse    Trichomonal vaginitis     Patient Active Problem List   Diagnosis Date Noted   Syncope 03/29/2019   Loss of consciousness (HCC) 07/06/2017   Loss of weight 04/30/2016   Lower back pain 03/04/2016   Atopic dermatitis 10/10/2015   History of CVA (cerebrovascular accident) 09/05/2015   Health maintenance examination 10/29/2011   Primary osteoarthritis of both knees 02/28/2011   Depression 08/10/2009   DJD (degenerative joint disease) 07/03/2006   Essential hypertension 06/08/2006   ASTHMA 06/08/2006   TAH/BSO, HX OF 06/08/2006    Past Surgical  History:  Procedure Laterality Date   ABDOMINAL HYSTERECTOMY  04/13/1989   Per Dr. Lilian Kapur. Abdominal hysterectomy and bilateral salpingo-oophorectomy   FOOT SURGERY       OB History   No obstetric history on file.     Family History  Problem Relation Age of Onset   Cancer Father        colon cancer   Cancer Paternal Aunt        colon cancer   Breast cancer Neg Hx     Social History   Tobacco Use   Smoking status: Former    Packs/day: 0.10    Pack years: 0.00    Types: Cigarettes    Quit date: 02/25/2011    Years since quitting: 9.7   Smokeless tobacco: Former   Tobacco comments:    quit x 2 months.  Substance Use Topics   Alcohol use: No    Alcohol/week: 0.0 standard drinks   Drug use: No    Home Medications Prior to Admission medications   Medication Sig Start Date End Date Taking? Authorizing Provider  FLUoxetine (PROZAC) 20 MG capsule Take 20 mg by mouth daily. 11/26/20  Yes [provider]  hydrochlorothiazide (HYDRODIURIL) 25 MG tablet Take 25 mg by mouth daily. 11/26/20  Yes [provider]  lisinopril (ZESTRIL) 40 MG tablet Take 40 mg by mouth daily. 11/26/20  Yes [provider]  OLANZapine (ZYPREXA) 2.5 MG tablet Take 2.5 mg by mouth at bedtime.   Yes [provider]  aspirin 81 MG tablet Take 1 tablet (81 mg total) by mouth daily. Patient not taking: No sig reported 03/04/16   Arnetha Courser, MD  lisinopril-hydrochlorothiazide (PRINZIDE,ZESTORETIC) 20-25 MG tablet Take 1 tablet by mouth daily. Patient not taking: Reported on 12/01/2020 03/04/16   Arnetha Courser, MD    Allergies    Patient has no known allergies.  Review of Systems   Review of Systems  Constitutional:  Negative for chills and fever.  HENT:  Negative for sore throat.   Eyes:  Negative for pain and visual disturbance.  Respiratory:  Negative for cough and shortness of breath.   Cardiovascular:  Negative for chest pain, palpitations and leg swelling.   Gastrointestinal:  Negative for abdominal pain, blood in stool, diarrhea and vomiting.  Genitourinary:  Negative for dysuria and flank pain.  Musculoskeletal:  Negative for back pain and neck pain.  Skin:  Negative for rash.  Neurological:  Negative for weakness, numbness and headaches.  Hematological:  Does not bruise/bleed easily.  Psychiatric/Behavioral:  Negative for confusion.    Physical Exam Updated Vital Signs BP (!) 186/87   Pulse (!) 58   Temp 97.9 F (36.6 C) (Oral)   Resp 16   Ht 1.626 m (5\' 4" )   Wt 54.4 kg   SpO2 100%   BMI 20.60 kg/m   Physical Exam Vitals and nursing note reviewed.  Constitutional:      Appearance: Normal appearance. She is well-developed.  HENT:     Head: Atraumatic.     Nose: Nose normal.     Mouth/Throat:     Mouth: Mucous membranes are moist.  Eyes:     General: No scleral icterus.    Conjunctiva/sclera: Conjunctivae normal.     Pupils: Pupils are equal, round, and reactive to light.  Neck:     Vascular: No carotid bruit.     Trachea: No tracheal deviation.  Cardiovascular:     Rate and Rhythm: Normal rate and regular rhythm.     Pulses: Normal pulses.     Heart sounds: Normal heart sounds. No murmur heard.   No friction rub. No gallop.  Pulmonary:     Effort: Pulmonary effort is normal. No respiratory distress.     Breath sounds: Normal breath sounds.     Comments: Mild anterior chest wall tenderness. No sts. Normal chest wall movement. No crepitus.  Chest:     Chest wall: Tenderness present.  Abdominal:     General: Bowel sounds are normal. There is no distension.     Palpations: Abdomen is soft.     Tenderness: There is no abdominal tenderness. There is no guarding.     Comments: No abd bruising or contusion.   Genitourinary:    Comments: No cva tenderness.  Musculoskeletal:        General: No swelling.     Cervical back: Normal range of motion and neck supple. No rigidity or tenderness. No muscular tenderness.      Comments: CTLS spine, non tender, aligned, no step off. Good rom bil extremities without pain or focal bony tenderness. Distal pulses palp, equal, bilaterally.   Skin:    General: Skin is warm and dry.     Findings: No rash.  Neurological:     Mental Status: She is alert.     Comments: Alert, speech normal. GCS 15. Motor/sens grossly  intact bil.   Psychiatric:        Mood and Affect: Mood normal.    ED Results / Procedures / Treatments   Labs (all labs ordered are listed, but only abnormal results are displayed) Results for orders placed or performed during the hospital encounter of 12/01/20  CBC  Result Value Ref Range   WBC 4.3 4.0 - 10.5 K/uL   RBC 4.00 3.87 - 5.11 MIL/uL   Hemoglobin 12.5 12.0 - 15.0 g/dL   HCT 16.139.8 09.636.0 - 04.546.0 %   MCV 99.5 80.0 - 100.0 fL   MCH 31.3 26.0 - 34.0 pg   MCHC 31.4 30.0 - 36.0 g/dL   RDW 40.913.7 81.111.5 - 91.415.5 %   Platelets 297 150 - 400 K/uL   nRBC 0.0 0.0 - 0.2 %  Comprehensive metabolic panel  Result Value Ref Range   Sodium 141 135 - 145 mmol/L   Potassium 3.5 3.5 - 5.1 mmol/L   Chloride 105 98 - 111 mmol/L   CO2 29 22 - 32 mmol/L   Glucose, Bld 149 (H) 70 - 99 mg/dL   BUN 19 8 - 23 mg/dL   Creatinine, Ser 7.821.06 (H) 0.44 - 1.00 mg/dL   Calcium 9.4 8.9 - 95.610.3 mg/dL   Total Protein 6.2 (L) 6.5 - 8.1 g/dL   Albumin 3.5 3.5 - 5.0 g/dL   AST 18 15 - 41 U/L   ALT 12 0 - 44 U/L   Alkaline Phosphatase 77 38 - 126 U/L   Total Bilirubin 0.5 0.3 - 1.2 mg/dL   GFR, Estimated 54 (L) >60 mL/min   Anion gap 7 5 - 15  Troponin I (High Sensitivity)  Result Value Ref Range   Troponin I (High Sensitivity) 4 <18 ng/L  Troponin I (High Sensitivity)  Result Value Ref Range   Troponin I (High Sensitivity) 5 <18 ng/L   DG Chest Port 1 View  Result Date: 12/01/2020 CLINICAL DATA:  Pain after fall EXAM: PORTABLE CHEST 1 VIEW COMPARISON:  March 29, 2019 FINDINGS: Healed left posterior sixth rib fracture. No pneumothorax. No pulmonary nodules, masses, or  infiltrates. The cardiomediastinal silhouette is stable. No other acute abnormalities. IMPRESSION: No cause for the patient's symptoms. Electronically Signed   By: Gerome Samavid  Williams III M.D   On: 12/01/2020 20:14    EKG EKG Interpretation  Date/Time:  Saturday December 01 2020 19:26:32 EDT Ventricular Rate:  50 PR Interval:  163 QRS Duration: 87 QT Interval:  411 QTC Calculation: 375 R Axis:   39 Text Interpretation: Sinus rhythm Probable left ventricular hypertrophy Non-specific ST-t changes Confirmed by Cathren LaineSteinl, Gedalia Mcmillon ([id number]) on 12/01/2020 7:40:17 PM  Radiology DG Chest Port 1 View  Result Date: 12/01/2020 CLINICAL DATA:  Pain after fall EXAM: PORTABLE CHEST 1 VIEW COMPARISON:  March 29, 2019 FINDINGS: Healed left posterior sixth rib fracture. No pneumothorax. No pulmonary nodules, masses, or infiltrates. The cardiomediastinal silhouette is stable. No other acute abnormalities. IMPRESSION: No cause for the patient's symptoms. Electronically Signed   By: Gerome Samavid  Williams III M.D   On: 12/01/2020 20:14    Procedures Procedures   Medications Ordered in ED Medications - No data to display  ED Course  I have reviewed the triage vital signs and the nursing notes.  Pertinent labs & imaging results that were available during my care of the patient were reviewed by me and considered in my medical decision making (see chart for details).    MDM Rules/Calculators/A&P  Iv ns. Continuous pulse ox and cardiac monitoring. Stat labs. Ecg.   Reviewed nursing notes and prior charts for additional history.   EMS had activated code stemi pta, cardiology evaluated pt/ecg in ED and cancelled stemi. Pt currently denies chest pain.   Labs reviewed/interpreted by me - chem normal. Initial trop normal.   CXR reviewed/interpreted by me - no pna.  2210, no cp or discomfort. Delta troponin is pending.   Additional labs reviewed/interpreted by me - repeat trop normal. Initial and  delta trop normal and not increasing - felt not c/w acs.   Recheck - no chest pain or discomfort. No sob. Has eaten/drank, no current c/o.  Pt is ambulatory in ed and to bathroom with steady gait - no ataxia or dizziness, no faintness or lightheadedness.   Pt indicates she feels fine and indicates ready for d/c.   Pt currently appears stable for d/c.   Rec close pcp f/u.  Return precautions provided.       Final Clinical Impression(s) / ED Diagnoses Final diagnoses:  None    Rx / DC Orders ED Discharge Orders     None         Cathren Laine, MD 12/01/20 2354

## 2020-12-01 NOTE — Discharge Instructions (Addendum)
It was our pleasure to provide your ER care today - we hope that you feel better.  Overall, your test results look good.   Drink plenty of fluids/stay well hydrated.  Follow up with primary care doctor this coming week - have your blood pressure rechecked then as it is high today. For chest pain, follow up with cardiologist in the next 1-2 weeks.   Return to ER if worse, new symptoms, fevers, new or severe pain, chest pain, trouble breathing, weak/fainting, or other concern.

## 2020-12-01 NOTE — ED Notes (Signed)
Code STEMI cancelled. 

## 2020-12-01 NOTE — ED Triage Notes (Signed)
Pt arrived via [facility] for Code Stemi. EMS was called to residence for fall, found pt down. Pt reported center, non-radiating chest pain and dizziness. EMS report elevation in V2, V3, V4 and called code stemi. PMH of MI 3 years ago. Pt A&Ox3. 1 nitro given in route, pt refused ASA.   EMS Vitals: BP 153/75 P 52 RR 18 SPO2 96% RA BS 128

## 2020-12-02 NOTE — ED Notes (Signed)
Patient verbalizes understanding of discharge instructions. Opportunity for questioning and answers were provided. Armband removed by staff, pt discharged from ED via wheelchair.  

## 2021-06-04 ENCOUNTER — Ambulatory Visit: Payer: Medicare Other | Admitting: Podiatry

## 2021-06-19 ENCOUNTER — Other Ambulatory Visit (HOSPITAL_COMMUNITY): Payer: Self-pay | Admitting: Registered Nurse

## 2021-06-19 DIAGNOSIS — M25511 Pain in right shoulder: Secondary | ICD-10-CM

## 2021-06-21 ENCOUNTER — Ambulatory Visit: Payer: Medicare Other | Admitting: Podiatry

## 2021-08-04 ENCOUNTER — Encounter (HOSPITAL_COMMUNITY): Payer: Self-pay

## 2021-08-04 ENCOUNTER — Other Ambulatory Visit: Payer: Self-pay

## 2021-08-04 ENCOUNTER — Emergency Department (HOSPITAL_COMMUNITY)
Admission: EM | Admit: 2021-08-04 | Discharge: 2021-08-04 | Disposition: A | Discharge status: Home / Self Care | Payer: Medicare Other | Attending: Emergency Medicine | Admitting: Emergency Medicine

## 2021-08-04 DIAGNOSIS — Z79899 Other long term (current) drug therapy: Secondary | ICD-10-CM | POA: Insufficient documentation

## 2021-08-04 DIAGNOSIS — Z5900 Homelessness unspecified: Secondary | ICD-10-CM | POA: Insufficient documentation

## 2021-08-04 DIAGNOSIS — Z7982 Long term (current) use of aspirin: Secondary | ICD-10-CM | POA: Diagnosis not present

## 2021-08-04 DIAGNOSIS — I10 Essential (primary) hypertension: Secondary | ICD-10-CM | POA: Diagnosis not present

## 2021-08-04 MED ORDER — HYDROCHLOROTHIAZIDE 25 MG PO TABS
25.0000 mg | ORAL_TABLET | Freq: Once | ORAL | Status: AC
  Administered 2021-08-04: 25 mg via ORAL
  Filled 2021-08-04: qty 1

## 2021-08-04 NOTE — ED Triage Notes (Addendum)
Pt arrives via EMS. Pt states she was bending over to pick up her luggage and she fell. Pt was on her way to the bus station. Pt c/o bilateral knee pain, lower back pain, and right wrist pain. Pt is oriented to name, dob, and situation, pt was unaware of the month or year. She states this is her baseline due to her alzheimer's

## 2021-08-04 NOTE — ED Notes (Signed)
Meal provided to patient

## 2021-08-04 NOTE — ED Notes (Signed)
Asked patient if I could contact her family or friends. She declined. She states she plans on getting back on the bus to go see her family. Bus pass has been provided.

## 2021-08-04 NOTE — ED Notes (Signed)
Pt ambulatory, refuses assistance with a wheelchair to the bus stop. Security is walking with patient to the bus stop. Pt reports she may go visit [employer].

## 2021-08-04 NOTE — Discharge Instructions (Signed)
Please return to the emergency room with any severe headache, dizziness, chest pain, shortness of breath or worsening of any symptoms.

## 2021-08-04 NOTE — ED Provider Notes (Signed)
[facility] EMERGENCY DEPARTMENT Provider Note   CSN: [id number] Arrival date & time:        History  Chief Complaint  Patient presents with   Marletta Lor    Deborah [name] is a 76 y.o. female with a past medical history of hypertension and CVA presenting today with the original complaint of a fall.  When I went to evaluate the patient, she was difficult to arouse and would not really respond to my questions.  When I asked her why she was here she told me that she did not know.  Said that she had no pain, difficulty breathing, headache and did not mention any fall.  She only requested something to eat.  She has her leg is at bedside and is bundled under the blanket.   Home Medications Prior to Admission medications   Medication Sig Start Date End Date Taking? Authorizing Provider  aspirin 81 MG tablet Take 1 tablet (81 mg total) by mouth daily. Patient not taking: No sig reported 03/04/16   Arnetha Courser, MD  FLUoxetine (PROZAC) 20 MG capsule Take 20 mg by mouth daily. 11/26/20   [provider]  hydrochlorothiazide (HYDRODIURIL) 25 MG tablet Take 25 mg by mouth daily. 11/26/20   [provider]  lisinopril (ZESTRIL) 40 MG tablet Take 40 mg by mouth daily. 11/26/20   [provider]  lisinopril-hydrochlorothiazide (PRINZIDE,ZESTORETIC) 20-25 MG tablet Take 1 tablet by mouth daily. Patient not taking: Reported on 12/01/2020 03/04/16   Arnetha Courser, MD  meloxicam (MOBIC) 7.5 MG tablet Take 7.5 mg by mouth daily as needed. 04/19/21   [provider]  OLANZapine (ZYPREXA) 2.5 MG tablet Take 2.5 mg by mouth at bedtime.    [provider]  Vitamin D, Cholecalciferol, 25 MCG (1000 UT) TABS Take 1 tablet by mouth daily. 02/08/21   [provider]      Allergies    Patient has no known allergies.    Review of Systems   Review of Systems See HPI  Physical Exam Updated Vital Signs BP (!) 159/126 (BP Location: Right Arm)    Pulse 69     Temp (!) 97.4 F (36.3 C) (Oral)    Resp 18    Ht 5\' 4"  (1.626 m)    Wt 52.2 kg    SpO2 100%    BMI 19.74 kg/m  Physical Exam Vitals and nursing note reviewed.  Constitutional:      Appearance: Normal appearance.  HENT:     Head: Normocephalic and atraumatic.  Eyes:     General: No scleral icterus.    Conjunctiva/sclera: Conjunctivae normal.  Pulmonary:     Effort: Pulmonary effort is normal. No respiratory distress.  Skin:    Findings: No rash.  Neurological:     Mental Status: She is alert.  Psychiatric:        Mood and Affect: Mood normal.     Comments: Patient is slow to respond to my questions.      ED Results / Procedures / Treatments   Labs (all labs ordered are listed, but only abnormal results are displayed) Labs Reviewed - No data to display  EKG None  Radiology No results found.  Procedures Procedures    Medications Ordered in ED Medications - No data to display  ED Course/ Medical Decision Making/ A&P                           Medical  Decision Making Risk Prescription drug management.   76 year old female presenting today for an unknown reason.  She states that she "does not really know why she is here."  Upon further questioning, she reports that she is homeless and has been living on the street for 2 to 3 years.  When I offered for her resources to find a shelter she said "I do not want to live there."  I asked her if she would rather live on the street and she said that that would be her preference.  Blood pressure slightly elevated, per chart review she takes hydrochlorothiazide.  We will give her dose and discharge her from the emergency department with return precautions due to her past of CVA.  Final Clinical Impression(s) / ED Diagnoses Final diagnoses:  Homelessness    Rx / DC Orders Results and diagnoses were explained to the patient. Return precautions discussed in full. Patient had no additional questions and expressed complete  understanding.   This chart was dictated using voice recognition software.  Despite best efforts to proofread,  errors can occur which can change the documentation meaning.      Woodroe Chen 08/04/21 1204    Terrilee Files, MD 08/04/21 1730

## 2021-12-08 ENCOUNTER — Inpatient Hospital Stay (HOSPITAL_COMMUNITY)
Admission: EM | Admit: 2021-12-08 | Discharge: 2021-12-27 | DRG: 064 | Disposition: A | Discharge status: Skilled Nursing Facility | Payer: Medicare Other | Attending: Family Medicine | Admitting: Family Medicine

## 2021-12-08 ENCOUNTER — Emergency Department (HOSPITAL_COMMUNITY): Payer: Medicare Other

## 2021-12-08 ENCOUNTER — Inpatient Hospital Stay (HOSPITAL_COMMUNITY): Payer: Medicare Other

## 2021-12-08 DIAGNOSIS — I959 Hypotension, unspecified: Secondary | ICD-10-CM | POA: Diagnosis not present

## 2021-12-08 DIAGNOSIS — G9341 Metabolic encephalopathy: Secondary | ICD-10-CM | POA: Diagnosis present

## 2021-12-08 DIAGNOSIS — F419 Anxiety disorder, unspecified: Secondary | ICD-10-CM | POA: Diagnosis present

## 2021-12-08 DIAGNOSIS — Z5987 Material hardship due to limited financial resources, not elsewhere classified: Secondary | ICD-10-CM

## 2021-12-08 DIAGNOSIS — I6783 Posterior reversible encephalopathy syndrome: Secondary | ICD-10-CM | POA: Diagnosis not present

## 2021-12-08 DIAGNOSIS — F39 Unspecified mood [affective] disorder: Secondary | ICD-10-CM | POA: Diagnosis present

## 2021-12-08 DIAGNOSIS — Z5900 Homelessness unspecified: Secondary | ICD-10-CM | POA: Diagnosis not present

## 2021-12-08 DIAGNOSIS — Z87891 Personal history of nicotine dependence: Secondary | ICD-10-CM | POA: Diagnosis not present

## 2021-12-08 DIAGNOSIS — Z79899 Other long term (current) drug therapy: Secondary | ICD-10-CM | POA: Diagnosis not present

## 2021-12-08 DIAGNOSIS — Z8673 Personal history of transient ischemic attack (TIA), and cerebral infarction without residual deficits: Secondary | ICD-10-CM

## 2021-12-08 DIAGNOSIS — I10 Essential (primary) hypertension: Secondary | ICD-10-CM | POA: Diagnosis present

## 2021-12-08 DIAGNOSIS — E162 Hypoglycemia, unspecified: Secondary | ICD-10-CM | POA: Diagnosis not present

## 2021-12-08 DIAGNOSIS — I3139 Other pericardial effusion (noninflammatory): Secondary | ICD-10-CM | POA: Diagnosis not present

## 2021-12-08 DIAGNOSIS — E876 Hypokalemia: Secondary | ICD-10-CM | POA: Diagnosis not present

## 2021-12-08 DIAGNOSIS — E43 Unspecified severe protein-calorie malnutrition: Secondary | ICD-10-CM | POA: Diagnosis present

## 2021-12-08 DIAGNOSIS — Z781 Physical restraint status: Secondary | ICD-10-CM

## 2021-12-08 DIAGNOSIS — I6381 Other cerebral infarction due to occlusion or stenosis of small artery: Secondary | ICD-10-CM | POA: Diagnosis present

## 2021-12-08 DIAGNOSIS — I6389 Other cerebral infarction: Secondary | ICD-10-CM | POA: Diagnosis not present

## 2021-12-08 DIAGNOSIS — J45909 Unspecified asthma, uncomplicated: Secondary | ICD-10-CM | POA: Diagnosis present

## 2021-12-08 DIAGNOSIS — I161 Hypertensive emergency: Secondary | ICD-10-CM | POA: Diagnosis present

## 2021-12-08 DIAGNOSIS — Z7982 Long term (current) use of aspirin: Secondary | ICD-10-CM | POA: Diagnosis not present

## 2021-12-08 DIAGNOSIS — Z681 Body mass index (BMI) 19 or less, adult: Secondary | ICD-10-CM

## 2021-12-08 DIAGNOSIS — F1910 Other psychoactive substance abuse, uncomplicated: Secondary | ICD-10-CM | POA: Diagnosis not present

## 2021-12-08 LAB — CBC WITH DIFFERENTIAL/PLATELET
Abs Immature Granulocytes: 0.01 10*3/uL (ref 0.00–0.07)
Basophils Absolute: 0 10*3/uL (ref 0.0–0.1)
Basophils Relative: 1 %
Eosinophils Absolute: 0.2 10*3/uL (ref 0.0–0.5)
Eosinophils Relative: 4 %
HCT: 36.4 % (ref 36.0–46.0)
Hemoglobin: 11.7 g/dL — ABNORMAL LOW (ref 12.0–15.0)
Immature Granulocytes: 0 %
Lymphocytes Relative: 38 %
Lymphs Abs: 1.7 10*3/uL (ref 0.7–4.0)
MCH: 31.4 pg (ref 26.0–34.0)
MCHC: 32.1 g/dL (ref 30.0–36.0)
MCV: 97.6 fL (ref 80.0–100.0)
Monocytes Absolute: 0.3 10*3/uL (ref 0.1–1.0)
Monocytes Relative: 6 %
Neutro Abs: 2.4 10*3/uL (ref 1.7–7.7)
Neutrophils Relative %: 51 %
Platelets: 340 10*3/uL (ref 150–400)
RBC: 3.73 MIL/uL — ABNORMAL LOW (ref 3.87–5.11)
RDW: 14.3 % (ref 11.5–15.5)
WBC: 4.6 10*3/uL (ref 4.0–10.5)
nRBC: 0 % (ref 0.0–0.2)

## 2021-12-08 LAB — COMPREHENSIVE METABOLIC PANEL
ALT: 13 U/L (ref 0–44)
AST: 20 U/L (ref 15–41)
Albumin: 3.6 g/dL (ref 3.5–5.0)
Alkaline Phosphatase: 91 U/L (ref 38–126)
Anion gap: 8 (ref 5–15)
BUN: 23 mg/dL (ref 8–23)
CO2: 24 mmol/L (ref 22–32)
Calcium: 9.2 mg/dL (ref 8.9–10.3)
Chloride: 114 mmol/L — ABNORMAL HIGH (ref 98–111)
Creatinine, Ser: 0.86 mg/dL (ref 0.44–1.00)
GFR, Estimated: >60 mL/min (ref >60)
Glucose, Bld: 78 mg/dL (ref 70–99)
Potassium: 3.6 mmol/L (ref 3.5–5.1)
Sodium: 146 mmol/L — ABNORMAL HIGH (ref 135–145)
Total Bilirubin: 0.5 mg/dL (ref 0.3–1.2)
Total Protein: 6.4 g/dL — ABNORMAL LOW (ref 6.5–8.1)

## 2021-12-08 LAB — CBC
HCT: 33.6 % — ABNORMAL LOW (ref 36.0–46.0)
Hemoglobin: 11.2 g/dL — ABNORMAL LOW (ref 12.0–15.0)
MCH: 32.1 pg (ref 26.0–34.0)
MCHC: 33.3 g/dL (ref 30.0–36.0)
MCV: 96.3 fL (ref 80.0–100.0)
Platelets: 291 10*3/uL (ref 150–400)
RBC: 3.49 MIL/uL — ABNORMAL LOW (ref 3.87–5.11)
RDW: 14.3 % (ref 11.5–15.5)
WBC: 5.4 10*3/uL (ref 4.0–10.5)
nRBC: 0 % (ref 0.0–0.2)

## 2021-12-08 LAB — RAPID URINE DRUG SCREEN, HOSP PERFORMED
Amphetamines: NOT DETECTED
Barbiturates: NOT DETECTED
Benzodiazepines: POSITIVE — AB
Cocaine: POSITIVE — AB
Opiates: NOT DETECTED
Tetrahydrocannabinol: NOT DETECTED

## 2021-12-08 LAB — GLUCOSE, CAPILLARY
Glucose-Capillary: 101 mg/dL — ABNORMAL HIGH (ref 70–99)
Glucose-Capillary: 185 mg/dL — ABNORMAL HIGH (ref 70–99)
Glucose-Capillary: 57 mg/dL — ABNORMAL LOW (ref 70–99)

## 2021-12-08 LAB — I-STAT VENOUS BLOOD GAS, ED
Acid-Base Excess: 2 mmol/L (ref 0.0–2.0)
Bicarbonate: 27.5 mmol/L (ref 20.0–28.0)
Calcium, Ion: 1.2 mmol/L (ref 1.15–1.40)
HCT: 34 % — ABNORMAL LOW (ref 36.0–46.0)
Hemoglobin: 11.6 g/dL — ABNORMAL LOW (ref 12.0–15.0)
O2 Saturation: 95 %
Potassium: 3.4 mmol/L — ABNORMAL LOW (ref 3.5–5.1)
Sodium: 148 mmol/L — ABNORMAL HIGH (ref 135–145)
TCO2: 29 mmol/L (ref 22–32)
pCO2, Ven: 43.4 mmHg — ABNORMAL LOW (ref 44–60)
pH, Ven: 7.41 (ref 7.25–7.43)
pO2, Ven: 74 mmHg — ABNORMAL HIGH (ref 32–45)

## 2021-12-08 LAB — URINALYSIS, ROUTINE W REFLEX MICROSCOPIC
Bilirubin Urine: NEGATIVE
Glucose, UA: NEGATIVE mg/dL
Ketones, ur: NEGATIVE mg/dL
Leukocytes,Ua: NEGATIVE
Nitrite: NEGATIVE
Protein, ur: NEGATIVE mg/dL
Specific Gravity, Urine: 1.016 (ref 1.005–1.030)
pH: 5 (ref 5.0–8.0)

## 2021-12-08 LAB — LACTIC ACID, PLASMA: Lactic Acid, Venous: 1 mmol/L (ref 0.5–1.9)

## 2021-12-08 LAB — CREATININE, SERUM
Creatinine, Ser: 0.68 mg/dL (ref 0.44–1.00)
GFR, Estimated: >60 mL/min (ref >60)

## 2021-12-08 LAB — MRSA NEXT GEN BY PCR, NASAL: MRSA by PCR Next Gen: NOT DETECTED

## 2021-12-08 LAB — PHOSPHORUS: Phosphorus: 4 mg/dL (ref 2.5–4.6)

## 2021-12-08 LAB — ETHANOL: Alcohol, Ethyl (B): <10 mg/dL (ref <10)

## 2021-12-08 LAB — MAGNESIUM: Magnesium: 2.1 mg/dL (ref 1.7–2.4)

## 2021-12-08 MED ORDER — ASPIRIN 81 MG PO CHEW
81.0000 mg | CHEWABLE_TABLET | Freq: Every day | ORAL | Status: DC
Start: 2021-12-08 — End: 2021-12-09
  Administered 2021-12-09: 81 mg via ORAL
  Filled 2021-12-08: qty 1

## 2021-12-08 MED ORDER — DEXTROSE 50 % IV SOLN
INTRAVENOUS | Status: AC
  Administered 2021-12-08: 50 mL
  Filled 2021-12-08: qty 50

## 2021-12-08 MED ORDER — THIAMINE HCL 100 MG/ML IJ SOLN
100.0000 mg | Freq: Once | INTRAMUSCULAR | Status: AC
  Administered 2021-12-08: 100 mg via INTRAVENOUS
  Filled 2021-12-08: qty 2

## 2021-12-08 MED ORDER — LISINOPRIL 20 MG PO TABS
40.0000 mg | ORAL_TABLET | Freq: Every day | ORAL | Status: DC
Start: 2021-12-08 — End: 2021-12-08

## 2021-12-08 MED ORDER — LORAZEPAM 2 MG/ML IJ SOLN
1.0000 mg | INTRAMUSCULAR | Status: DC | PRN
  Filled 2021-12-08 (×2): qty 1

## 2021-12-08 MED ORDER — CLEVIDIPINE BUTYRATE 0.5 MG/ML IV EMUL
0.0000 mg/h | INTRAVENOUS | Status: DC
  Filled 2021-12-08: qty 50

## 2021-12-08 MED ORDER — HEPARIN SODIUM (PORCINE) 5000 UNIT/ML IJ SOLN
5000.0000 [IU] | Freq: Three times a day (TID) | INTRAMUSCULAR | Status: DC
  Administered 2021-12-08 – 2021-12-12 (×11): 5000 [IU] via SUBCUTANEOUS
  Filled 2021-12-08 (×11): qty 1

## 2021-12-08 MED ORDER — LACTATED RINGERS IV SOLN: INTRAVENOUS | Status: DC

## 2021-12-08 MED ORDER — HYDROCHLOROTHIAZIDE 25 MG PO TABS: 25.0000 mg | ORAL_TABLET | Freq: Every day | ORAL | Status: DC

## 2021-12-08 MED ORDER — HYDRALAZINE HCL 20 MG/ML IJ SOLN
10.0000 mg | INTRAMUSCULAR | Status: DC | PRN
  Administered 2021-12-08: 10 mg via INTRAVENOUS
  Filled 2021-12-08: qty 1

## 2021-12-08 MED ORDER — LORAZEPAM 2 MG/ML IJ SOLN
1.0000 mg | Freq: Once | INTRAMUSCULAR | Status: AC
  Administered 2021-12-08: 1 mg via INTRAVENOUS
  Filled 2021-12-08: qty 1

## 2021-12-08 MED ORDER — LACTATED RINGERS IV BOLUS
1000.0000 mL | Freq: Once | INTRAVENOUS | Status: AC
  Administered 2021-12-08: 1000 mL via INTRAVENOUS

## 2021-12-08 MED ORDER — SODIUM CHLORIDE 0.9 % IV SOLN: INTRAVENOUS | Status: DC | PRN

## 2021-12-08 MED ORDER — FLUOXETINE HCL 20 MG PO CAPS
20.0000 mg | ORAL_CAPSULE | Freq: Every day | ORAL | Status: DC
Start: 2021-12-08 — End: 2021-12-08

## 2021-12-08 MED ORDER — LORAZEPAM 2 MG/ML IJ SOLN: 1.0000 mg | INTRAMUSCULAR | Status: DC | PRN

## 2021-12-08 MED ORDER — LABETALOL HCL 5 MG/ML IV SOLN
10.0000 mg | INTRAVENOUS | Status: DC | PRN
  Administered 2021-12-08 – 2021-12-09 (×4): 20 mg via INTRAVENOUS
  Filled 2021-12-08 (×5): qty 4

## 2021-12-08 MED ORDER — OLANZAPINE 2.5 MG PO TABS
2.5000 mg | ORAL_TABLET | Freq: Every day | ORAL | Status: DC
Start: 2021-12-08 — End: 2021-12-08

## 2021-12-08 NOTE — Progress Notes (Signed)
Darryl Carole Binning (friend) called asking about patient. Per him, patient has no local family and has been staying with him for a while. Pts family lives in [place], he stated he will try and locate her family and get them to call the hospital. He vocalized some concern for the patient and even stated that she is welcome to come back to his house and stay once out of the hospital. Darryl's number already in the chart.

## 2021-12-08 NOTE — Progress Notes (Signed)
Hypoglycemic Event  CBG: 57  Treatment: D50 50 mL (25 gm)  Symptoms: None  Follow-up CBG: Time:2057 CBG Result:185  Possible Reasons for Event: Inadequate meal intake  Comments/MD notified:PCCM    Deborah [name]

## 2021-12-08 NOTE — ED Notes (Signed)
EDP notified of elevated BP 

## 2021-12-08 NOTE — Procedures (Signed)
Arterial Catheter Insertion Procedure Note  Deborah [name]  [id number]  August 30, 1945  Date:12/08/21  Time:10:34 PM    Provider Performing: Deborah [name]    Procedure: Insertion of Arterial Line (50093) with US guidance (81829)   Indication(s) Blood pressure monitoring and/or need for frequent ABGs  Consent Unable to obtain consent due to emergent nature of procedure.  Anesthesia None   Time Out Verified patient identification, verified procedure, site/side was marked, verified correct patient position, special equipment/implants available, medications/allergies/relevant history reviewed, required imaging and test results available.   Sterile Technique Maximal sterile technique including full sterile barrier drape, hand hygiene, sterile gown, sterile gloves, mask, hair covering, sterile ultrasound probe cover (if used).   Procedure Description Area of catheter insertion was cleaned with chlorhexidine and draped in sterile fashion. With real-time ultrasound guidance an arterial catheter was placed into the left radial artery.  Appropriate arterial tracings confirmed on monitor.     Complications/Tolerance None; patient tolerated the procedure well.   EBL Minimal   Specimen(s) None   Deborah Gasman Deborah Ohaver, PA-C

## 2021-12-08 NOTE — ED Notes (Signed)
Spoke with MRI. They stated they go down to 1 scanner at 2100.  They have prob 2 ahead of this pt and will get them when on floor.

## 2021-12-08 NOTE — ED Notes (Signed)
Left message with RN. Will call back soon.

## 2021-12-08 NOTE — Progress Notes (Signed)
eLink Physician-Brief Progress Note Patient Name: Deborah [name] DOB: 02-20-1946 MRN: [id number]   Date of Service  12/08/2021  HPI/Events of Note  75/F with hx of cocaine abuse, brought to the ED after being found down at a bus stop.  Bps as has as 217 in the ED. CT head concerning for PRES.   eICU Interventions  Hypertensive emergency Encephalopathy Polysubstance abuse  Labetalol and hydralazine for BP control with goal MAP of 100.  Pt has not needed gtts for BP control at this time.  Awaiting MRI results.  Heparin for DVT prophylaxis.       Intervention Category Evaluation Type: New Patient Evaluation  Larinda Buttery 12/08/2021, 8:37 PM

## 2021-12-08 NOTE — H&P (Addendum)
NAME:  Deborah [name], MRN:  [id number], DOB:  [birth year], LOS: 0 ADMISSION DATE:  12/08/2021, CONSULTATION DATE:  12/08/2021 REFERRING MD: Dr. Varney Biles , CHIEF COMPLAINT: Encephalopathy  History of Present Illness:  Patient was brought into the hospital with altered mental status Activated EMS following being found down at a bus stop  Alert and interactive oriented to name when was picked up At earlier interacted with another EMS crew who noted she was alert and oriented x4, normal, transported home  Blood pressure noted to be high in the emergency department This is gradually trended down  Patient had a CT scan of the head concerning for pres History of cocaine abuse, current U-Tox positive for cocaine  Pertinent  Medical History   Past Medical History:  Diagnosis Date   Alcohol abuse, in remission    Anxiety    Asthma    Cocaine abuse ([place])    CVA (cerebral vascular accident) ([place])    Degenerative joint disease    back   Fracture of hand    Headache, chronic daily    Hematuria    Hypertension    Inadequate material resources    Mental/behavioral problem    S/P TAH-BSO    Tobacco abuse    Trichomonal vaginitis      Significant Hospital Events: Including procedures, antibiotic start and stop dates in addition to other pertinent events   CT head concerning for press  Interim History / Subjective:  Encephalopathic  Objective   Blood pressure 127/76, pulse 71, temperature 98 F (36.7 C), temperature source Rectal, resp. rate 13, SpO2 99 %.        Intake/Output Summary (Last 24 hours) at 12/08/2021 1838 Last data filed at 12/08/2021 1825 Gross per 24 hour  Intake 1000 ml  Output 700 ml  Net 300 ml   There were no vitals filed for this visit.  Examination: General: Chronically ill-appearing, elderly, unkempt HENT: Dry oral mucosa Lungs: Clear breath sounds bilaterally Cardiovascular: S1-S2 appreciated Abdomen: Soft, bowel sounds  appreciated Extremities: No clubbing, no edema Neuro: Somnolent GU:   Resolved Hospital Problem list     Assessment & Plan:  Hypertensive emergency -Blood pressure has gradually trended down -As needed blood pressure control with labetalol, hydralazine -Hold off on Cleviprex at present  Place arterial line for close blood pressure monitoring  Urine toxicology positive for benzodiazepine and cocaine  Encephalopathy Pres -Order MRI brain for further evaluation  No seizures  If patient remains encephalopathic then may require EEG  Polysubstance abuse  Goal BP is MAP of about 100 -May need to initiate Cleviprex if MAP continues to trend above 100  Admit to ICU  Best Practice (right click and "Reselect all SmartList Selections" daily)   Diet/type: NPO DVT prophylaxis: LMWH GI prophylaxis: N/A Lines: N/A Foley:  N/A Code Status:  full code Last date of multidisciplinary goals of care discussion [pending]  Labs   CBC: Recent Labs  Lab 12/08/21 1550 12/08/21 1624  WBC 4.6  --   NEUTROABS 2.4  --   HGB 11.7* 11.6*  HCT 36.4 34.0*  MCV 97.6  --   PLT 340  --     Basic Metabolic Panel: Recent Labs  Lab 12/08/21 1550 12/08/21 1624  NA 146* 148*  K 3.6 3.4*  CL 114*  --   CO2 24  --   GLUCOSE 78  --   BUN 23  --   CREATININE 0.86  --   CALCIUM 9.2  --  GFR: CrCl cannot be calculated (Unknown ideal weight.). Recent Labs  Lab 12/08/21 1550 12/08/21 1607  WBC 4.6  --   LATICACIDVEN  --  1.0    Liver Function Tests: Recent Labs  Lab 12/08/21 1550  AST 20  ALT 13  ALKPHOS 91  BILITOT 0.5  PROT 6.4*  ALBUMIN 3.6   No results for input(s): "LIPASE", "AMYLASE" in the last 168 hours. No results for input(s): "AMMONIA" in the last 168 hours.  ABG    Component Value Date/Time   HCO3 27.5 12/08/2021 1624   TCO2 29 12/08/2021 1624   ACIDBASEDEF 2.0 05/28/2007 1334   O2SAT 95 12/08/2021 1624     Coagulation Profile: No results for  input(s): "INR", "PROTIME" in the last 168 hours.  Cardiac Enzymes: No results for input(s): "CKTOTAL", "CKMB", "CKMBINDEX", "TROPONINI" in the last 168 hours.  HbA1C: Hemoglobin A1C  Date/Time Value Ref Range Status  09/05/2015 04:38 PM 5.9  Corrected    Comment:    CBG 69  reported to Glenda P RN  1 cup of juice given to patient 09-05-15  1632p  T Fulcher, PBT     Hgb A1c MFr Bld  Date/Time Value Ref Range Status  03/29/2019 06:30 PM 5.9 (H) 4.8 - 5.6 % Final    Comment:    (NOTE) Pre diabetes:          5.7%-6.4% Diabetes:              >6.4% Glycemic control for   <7.0% adults with diabetes     CBG: No results for input(s): "GLUCAP" in the last 168 hours.  Review of Systems:   Encephalopathic, unable to provide history  Past Medical History:  She,  has a past medical history of Alcohol abuse, in remission, Anxiety, Asthma, Cocaine abuse ([place]), CVA (cerebral vascular accident) ([place]), Degenerative joint disease, Fracture of hand, Headache, chronic daily, Hematuria, Hypertension, Inadequate material resources, Mental/behavioral problem, S/P TAH-BSO, Tobacco abuse, and Trichomonal vaginitis.   Surgical History:   Past Surgical History:  Procedure Laterality Date   ABDOMINAL HYSTERECTOMY  04/13/1989   Per Dr. Sherryle Lis. Abdominal hysterectomy and bilateral salpingo-oophorectomy   FOOT SURGERY       Social History:   reports that she quit smoking about 10 years ago. Her smoking use included cigarettes. She smoked an average of .1 packs per day. She has quit using smokeless tobacco. She reports that she does not drink alcohol and does not use drugs.   Family History:  Her family history includes Cancer in her father and paternal aunt. There is no history of Breast cancer.   Allergies No Known Allergies   Home Medications  Prior to Admission medications   Medication Sig Start Date End Date Taking? Authorizing Provider  aspirin 81 MG tablet Take 1 tablet (81 mg total) by  mouth daily. Patient not taking: No sig reported 03/04/16   Lorella Nimrod, MD  FLUoxetine (PROZAC) 20 MG capsule Take 20 mg by mouth daily. 11/26/20   [provider]  hydrochlorothiazide (HYDRODIURIL) 25 MG tablet Take 25 mg by mouth daily. 11/26/20   [provider]  lisinopril (ZESTRIL) 40 MG tablet Take 40 mg by mouth daily. 11/26/20   [provider]  lisinopril-hydrochlorothiazide (PRINZIDE,ZESTORETIC) 20-25 MG tablet Take 1 tablet by mouth daily. Patient not taking: Reported on 12/01/2020 03/04/16   Lorella Nimrod, MD  meloxicam (MOBIC) 7.5 MG tablet Take 7.5 mg by mouth daily as needed. 04/19/21   [provider]  OLANZapine (  ZYPREXA) 2.5 MG tablet Take 2.5 mg by mouth at bedtime.    [provider]  Vitamin D, Cholecalciferol, 25 MCG (1000 UT) TABS Take 1 tablet by mouth daily. 02/08/21   [provider]    The patient is critically ill with multiple organ systems failure and requires high complexity decision making for assessment and support, frequent evaluation and titration of therapies, application of advanced monitoring technologies and extensive interpretation of multiple databases. Critical Care Time devoted to patient care services described in this note independent of APP/resident time (if applicable)  is 35 minutes.   Sherrilyn Rist MD [facility] Personal pager: See Amion If unanswered, please page CCM On-call: [phone]

## 2021-12-08 NOTE — ED Notes (Signed)
EDP Nanavati asked to wait on cleviprex admin for the moment

## 2021-12-08 NOTE — ED Triage Notes (Signed)
EMS called out for unknown down person.  Pt found laying down on ground at bus stop.  EMS assessed pt and she was found to be Alert and oriented only to name and DOB.  After 500 mL fluid pt mental status had not improved so pt was told she needed to be transported.  She became combative.  EMS gave 2.5 mg Haldol, pt ripped out iv.  Ems gave 5mg  Versed IM.  Pt is sedated on arrival.    Per current EMS crew. This pt was called out earlier today and run by a different EMS crew who states she was A&O x4 and totally normal.  Pt was taken home by PD.   BP 139/83 98%  RA HR 77 NS, CBG 77

## 2021-12-09 ENCOUNTER — Inpatient Hospital Stay (HOSPITAL_COMMUNITY): Payer: Medicare Other

## 2021-12-09 DIAGNOSIS — Z5987 Material hardship due to limited financial resources, not elsewhere classified: Secondary | ICD-10-CM

## 2021-12-09 DIAGNOSIS — I6783 Posterior reversible encephalopathy syndrome: Secondary | ICD-10-CM | POA: Diagnosis not present

## 2021-12-09 DIAGNOSIS — G9341 Metabolic encephalopathy: Secondary | ICD-10-CM | POA: Diagnosis not present

## 2021-12-09 DIAGNOSIS — I6381 Other cerebral infarction due to occlusion or stenosis of small artery: Secondary | ICD-10-CM

## 2021-12-09 DIAGNOSIS — F1910 Other psychoactive substance abuse, uncomplicated: Secondary | ICD-10-CM | POA: Insufficient documentation

## 2021-12-09 LAB — BASIC METABOLIC PANEL
Anion gap: 6 (ref 5–15)
BUN: 13 mg/dL (ref 8–23)
CO2: 25 mmol/L (ref 22–32)
Calcium: 9.1 mg/dL (ref 8.9–10.3)
Chloride: 109 mmol/L (ref 98–111)
Creatinine, Ser: 0.62 mg/dL (ref 0.44–1.00)
GFR, Estimated: >60 mL/min (ref >60)
Glucose, Bld: 90 mg/dL (ref 70–99)
Potassium: 3.3 mmol/L — ABNORMAL LOW (ref 3.5–5.1)
Sodium: 140 mmol/L (ref 135–145)

## 2021-12-09 LAB — GLUCOSE, CAPILLARY
Glucose-Capillary: 76 mg/dL (ref 70–99)
Glucose-Capillary: 95 mg/dL (ref 70–99)
Glucose-Capillary: 109 mg/dL — ABNORMAL HIGH (ref 70–99)
Glucose-Capillary: 87 mg/dL (ref 70–99)
Glucose-Capillary: 138 mg/dL — ABNORMAL HIGH (ref 70–99)

## 2021-12-09 LAB — HEMOGLOBIN A1C
Hgb A1c MFr Bld: 5.5 % (ref 4.8–5.6)
Mean Plasma Glucose: 111.15 mg/dL

## 2021-12-09 LAB — CBC
HCT: 33.2 % — ABNORMAL LOW (ref 36.0–46.0)
Hemoglobin: 11.1 g/dL — ABNORMAL LOW (ref 12.0–15.0)
MCH: 32 pg (ref 26.0–34.0)
MCHC: 33.4 g/dL (ref 30.0–36.0)
MCV: 95.7 fL (ref 80.0–100.0)
Platelets: 288 10*3/uL (ref 150–400)
RBC: 3.47 MIL/uL — ABNORMAL LOW (ref 3.87–5.11)
RDW: 14.4 % (ref 11.5–15.5)
WBC: 4.6 10*3/uL (ref 4.0–10.5)
nRBC: 0 % (ref 0.0–0.2)

## 2021-12-09 LAB — MAGNESIUM: Magnesium: 1.9 mg/dL (ref 1.7–2.4)

## 2021-12-09 LAB — PHOSPHORUS: Phosphorus: 3.6 mg/dL (ref 2.5–4.6)

## 2021-12-09 MED ORDER — LORAZEPAM 2 MG/ML IJ SOLN: 1.0000 mg | Freq: Once | INTRAMUSCULAR | Status: DC

## 2021-12-09 MED ORDER — CHLORHEXIDINE GLUCONATE CLOTH 2 % EX PADS
6.0000 | MEDICATED_PAD | Freq: Every day | CUTANEOUS | Status: DC
  Administered 2021-12-09 – 2021-12-12 (×5): 6 via TOPICAL

## 2021-12-09 MED ORDER — ORAL CARE MOUTH RINSE
15.0000 mL | OROMUCOSAL | Status: DC
  Administered 2021-12-09 (×3): 15 mL via OROMUCOSAL

## 2021-12-09 MED ORDER — ASPIRIN 300 MG RE SUPP
300.0000 mg | Freq: Every day | RECTAL | Status: DC
  Filled 2021-12-09 (×3): qty 1

## 2021-12-09 MED ORDER — ASPIRIN 300 MG RE SUPP: 300.0000 mg | Freq: Every day | RECTAL | Status: DC

## 2021-12-09 MED ORDER — ASPIRIN 325 MG PO TABS: 325.0000 mg | ORAL_TABLET | Freq: Every day | ORAL | Status: DC

## 2021-12-09 MED ORDER — ASPIRIN 325 MG PO TABS
325.0000 mg | ORAL_TABLET | Freq: Every day | ORAL | Status: DC
  Administered 2021-12-11 – 2021-12-27 (×17): 325 mg via ORAL
  Filled 2021-12-09 (×18): qty 1

## 2021-12-09 MED ORDER — STROKE: EARLY STAGES OF RECOVERY BOOK
Freq: Once | Status: AC
  Filled 2021-12-09: qty 1

## 2021-12-09 MED ORDER — LABETALOL HCL 5 MG/ML IV SOLN
10.0000 mg | INTRAVENOUS | Status: DC | PRN
  Administered 2021-12-09: 20 mg via INTRAVENOUS
  Filled 2021-12-09: qty 4

## 2021-12-09 MED ORDER — ORAL CARE MOUTH RINSE: 15.0000 mL | OROMUCOSAL | Status: DC | PRN

## 2021-12-09 MED ORDER — LORAZEPAM 2 MG/ML IJ SOLN
1.0000 mg | INTRAMUSCULAR | Status: DC | PRN
  Administered 2021-12-09 – 2021-12-10 (×4): 2 mg via INTRAVENOUS
  Filled 2021-12-09 (×4): qty 1

## 2021-12-09 MED ORDER — HYDROCHLOROTHIAZIDE 12.5 MG PO TABS
12.5000 mg | ORAL_TABLET | Freq: Every day | ORAL | Status: DC
  Administered 2021-12-09 – 2021-12-12 (×3): 12.5 mg via ORAL
  Filled 2021-12-09 (×4): qty 1

## 2021-12-09 MED ORDER — LISINOPRIL 20 MG PO TABS
40.0000 mg | ORAL_TABLET | Freq: Every day | ORAL | Status: DC
  Administered 2021-12-11 – 2021-12-12 (×2): 40 mg via ORAL
  Filled 2021-12-09 (×3): qty 2

## 2021-12-09 MED ORDER — DEXTROSE IN LACTATED RINGERS 5 % IV SOLN: INTRAVENOUS | Status: DC

## 2021-12-09 MED ORDER — ROSUVASTATIN CALCIUM 5 MG PO TABS
10.0000 mg | ORAL_TABLET | Freq: Every day | ORAL | Status: DC
  Administered 2021-12-09 – 2021-12-27 (×18): 10 mg via ORAL
  Filled 2021-12-09 (×20): qty 2

## 2021-12-09 MED ORDER — POTASSIUM CHLORIDE CRYS ER 20 MEQ PO TBCR
40.0000 meq | EXTENDED_RELEASE_TABLET | Freq: Once | ORAL | Status: AC
  Administered 2021-12-09: 40 meq via ORAL
  Filled 2021-12-09: qty 2

## 2021-12-09 MED ORDER — IOHEXOL 350 MG/ML SOLN
100.0000 mL | Freq: Once | INTRAVENOUS | Status: AC | PRN
  Administered 2021-12-09: 75 mL via INTRAVENOUS

## 2021-12-09 MED ORDER — HYDRALAZINE HCL 20 MG/ML IJ SOLN
10.0000 mg | INTRAMUSCULAR | Status: DC | PRN
  Administered 2021-12-10: 10 mg via INTRAVENOUS
  Filled 2021-12-09: qty 1

## 2021-12-09 NOTE — Procedures (Signed)
Patient Name: Deborah [name]  MRN: [id number]  Epilepsy Attending: Charlsie Quest  Referring Physician/Provider: Micki Riley, MD  Date: 12/09/2021 Duration: 23.33 mins  Patient history: 76 year old African-American lady with altered mental status without focal deficits of unclear etiology. EEG to evaluate for seizure  Level of alertness: Awake, asleep  AEDs during EEG study: None  Technical aspects: This EEG study was done with scalp electrodes positioned according to the 10-20 International system of electrode placement. Electrical activity was acquired at a sampling rate of 500Hz  and reviewed with a high frequency filter of 70Hz  and a low frequency filter of 1Hz . EEG data were recorded continuously and digitally stored.   Description: The posterior dominant rhythm consists 8 Hz activity of moderate voltage (25-35 uV) seen predominantly in posterior head regions, symmetric and reactive to eye opening and eye closing. Sleep was characterized by vertex waves, sleep spindles (12 to 14 Hz), maximal frontocentral region. Hyperventilation and photic stimulation were not performed.     IMPRESSION: This study is within normal limits. No seizures or epileptiform discharges were seen throughout the recording.  Treysen Sudbeck 

## 2021-12-09 NOTE — Progress Notes (Signed)
eLink Physician-Brief Progress Note Patient Name: Deborah [name] DOB: Dec 08, 1945 MRN: [id number]   Date of Service  12/09/2021  HPI/Events of Note  Received a call from radiology regarding MRI - unlikely to be PRES. Pt however with acute infarct in the L thalamus.   eICU Interventions  Change the BP parameters at this time given acute evidence of infarct on MRI.  Will hold antihypertensives until SBP >220, DBP >120.      Intervention Category Intermediate Interventions: Communication with other healthcare providers and/or family  Larinda Buttery 12/09/2021, 2:47 AM

## 2021-12-09 NOTE — Evaluation (Addendum)
Physical Therapy Evaluation Patient Details Name: Deborah [name] MRN: [id number] DOB: [birth year] Today's Date: 12/09/2021  History of Present Illness  Pt is a 76 y.o. F who was found down at the bus stop and admitted 12/08/2021. MRI showing small left thalamic stroke. Significant PMH: allcohol and cocaine abuse, CVA, HTN.  Clinical Impression  Pt admitted with above. Pt oriented to self only; states she lives with "friends," in a one level home. Pt displays poor cognition (deficits noted in orientation, problem solving, awareness, attention), balance, left sided inattention, and decreased left sided coordination. Pt ambulating limited hallway distances with min-mod assist. Presents as a high fall risk based on deficits listed above. Recommend SNF at d/c.     Recommendations for follow up therapy are one component of a multi-disciplinary discharge planning process, led by the attending physician.  Recommendations may be updated based on patient status, additional functional criteria and insurance authorization.  Follow Up Recommendations Skilled nursing-short term rehab (<3 hours/day)    Assistance Recommended at Discharge Frequent or constant Supervision/Assistance  Patient can return home with the following  A little help with walking and/or transfers;A little help with bathing/dressing/bathroom;Assistance with cooking/housework;Direct supervision/assist for medications management;Assist for transportation;Help with stairs or ramp for entrance    Equipment Recommendations None recommended by PT  Recommendations for Other Services       Functional Status Assessment Patient has had a recent decline in their functional status and demonstrates the ability to make significant improvements in function in a reasonable and predictable amount of time.     Precautions / Restrictions Precautions Precautions: Fall Restrictions Weight Bearing Restrictions: No      Mobility  Bed Mobility Overal  bed mobility: Needs Assistance Bed Mobility: Supine to Sit     Supine to sit: Supervision          Transfers Overall transfer level: Needs assistance Equipment used: None Transfers: Sit to/from Stand Sit to Stand: Min guard           General transfer comment: close min guard assist    Ambulation/Gait Ambulation/Gait assistance: Min assist, Mod assist Gait Distance (Feet): 150 Feet Assistive device: None Gait Pattern/deviations: Step-through pattern, Decreased stride length, Drifts right/left Gait velocity: decreased     General Gait Details: Pt requiring mostly minA for balance, up to modA with turns or obstacle negotiation. Needs cues for environmental navigation and left sided attention  Stairs            Wheelchair Mobility    Modified Rankin (Stroke Patients Only) Modified Rankin (Stroke Patients Only) Pre-Morbid Rankin Score: No symptoms Modified Rankin: Moderately severe disability     Balance Overall balance assessment: Needs assistance Sitting-balance support: Feet supported Sitting balance-Leahy Scale: Good     Standing balance support: No upper extremity supported, During functional activity Standing balance-Leahy Scale: Fair Standing balance comment: min guard for washing hands at sink                             Pertinent Vitals/Pain Pain Assessment Pain Assessment: Faces Faces Pain Scale: No hurt    Home Living Family/patient expects to be discharged to:: Private residence Living Arrangements: Non-relatives/Friends Available Help at Discharge: Friend(s);Available PRN/intermittently Type of Home: House Home Access: Stairs to enter   [employer] of Steps: 1   Home Layout: One level   Additional Comments: Lives with friend named, "Darryl," and several other roommates? Pt is a poor historian    Prior  Function Prior Level of Function : Independent/Modified Independent             Mobility Comments:  Assume independent with mobility       Hand Dominance        Extremity/Trunk Assessment   Upper Extremity Assessment Upper Extremity Assessment: Defer to OT evaluation    Lower Extremity Assessment Lower Extremity Assessment: Overall WFL for tasks assessed    Cervical / Trunk Assessment Cervical / Trunk Assessment: Normal  Communication   Communication: No difficulties  Cognition Arousal/Alertness: Awake/alert Behavior During Therapy: Impulsive Overall Cognitive Status: Impaired/Different from baseline Area of Impairment: Orientation, Attention, Following commands, Memory, Safety/judgement, Awareness, Problem solving                 Orientation Level: Disoriented to, Place, Time, Situation Current Attention Level: Sustained Memory: Decreased short-term memory Following Commands: Follows one step commands inconsistently Safety/Judgement: Decreased awareness of safety, Decreased awareness of deficits Awareness: Intellectual Problem Solving: Difficulty sequencing, Requires verbal cues General Comments: Pt oriented to self only; able to state birthday. Pt reporting month was December or January; states she doesn't have a calendar to keep track. Pt very impulsive; attempting to get out of bed upon entry without assist with bed alarm sounding. Difficulty with problem solving, requiring cues for turning off water at sink and for obstacle negotiation in hallway        General Comments      Exercises     Assessment/Plan    PT Assessment Patient needs continued PT services  PT Problem List Decreased strength;Decreased activity tolerance;Decreased balance;Decreased mobility;Decreased coordination;Decreased cognition;Decreased safety awareness       PT Treatment Interventions Gait training;Stair training;Functional mobility training;Therapeutic activities;Therapeutic exercise;Balance training;Patient/family education    PT Goals (Current goals can be found in the Care  Plan section)  Acute Rehab PT Goals Patient Stated Goal: to eat PT Goal Formulation: With patient Time For Goal Achievement: 12/23/21 Potential to Achieve Goals: Good    Frequency Min 3X/week     Co-evaluation               AM-PAC PT "6 Clicks" Mobility  Outcome Measure Help needed turning from your back to your side while in a flat bed without using bedrails?: A Little Help needed moving from lying on your back to sitting on the side of a flat bed without using bedrails?: A Little Help needed moving to and from a bed to a chair (including a wheelchair)?: A Little Help needed standing up from a chair using your arms (e.g., wheelchair or bedside chair)?: A Little Help needed to walk in hospital room?: A Lot Help needed climbing 3-5 steps with a railing? : A Lot 6 Click Score: 16    End of Session Equipment Utilized During Treatment: Gait belt Activity Tolerance: Patient tolerated treatment well Patient left: in bed;with call bell/phone within reach;with bed alarm set Nurse Communication: Mobility status PT Visit Diagnosis: Unsteadiness on feet (R26.81)    Time: 5284-1324 PT Time Calculation (min) (ACUTE ONLY): 13 min   Charges:   PT Evaluation $PT Eval Moderate Complexity: 1 Mod          Lillia Pauls, PT, DPT Acute Rehabilitation Services Office [phone]   Norval Morton 12/09/2021, 2:02 PM

## 2021-12-09 NOTE — Progress Notes (Signed)
eLink Physician-Brief Progress Note Patient Name: Deborah [name] DOB: 08/16/1945 MRN: [id number]   Date of Service  12/09/2021  HPI/Events of Note  Severe agitated delirium.  Patient aggressive and violent.    eICU Interventions  ICU CIWA protocol     Intervention Category Major Interventions: Delirium, psychosis, severe agitation - evaluation and management  Henry Russel, P 12/09/2021, 8:38 PM

## 2021-12-09 NOTE — Progress Notes (Addendum)
eLink Physician-Brief Progress Note Patient Name: Deborah [name] DOB: [birth year] MRN: [id number]   Date of Service  12/09/2021  HPI/Events of Note  Pt is being brought down for MRI.  RN concerned of agitation.  Pt has ativan prn but only for seizures. Pt's utox positive for benzos and cocaine.   eICU Interventions  Can give ativan IV now.     Intervention Category Minor Interventions: Agitation / anxiety - evaluation and management  Larinda Buttery 12/09/2021, 1:24 AM  3:52 AM Ativan not needed for MRI. Order cancelled after discussing with RN.

## 2021-12-09 NOTE — Evaluation (Signed)
Occupational Therapy Evaluation Patient Details Name: Deborah [name] MRN: [id number] DOB: 1945/11/09 Today's Date: 12/09/2021   History of Present Illness Pt is a 76 y.o. F who [place] bus [place] and admitted 12/08/2021. MRI showing small left thalamic stroke. Significant PMH: allcohol and cocaine abuse, CVA, HTN.   Clinical Impression   Deborah [name] was evaluated s/p the above admission list, PLOF and home set up difficult to accurately obtain this date. Upon arrival pt was impulsively getting OOB with urinary urgency. She required max A to safety get from the bed to the bathroom due to unsteady gait and safety cues. Once in the bathroom she attempted to sit on the toilet, but missed, max A to safety adjust to toilet seat. Pt not following functional commands or attending to therapist while Deborah [name]. Once back supine pt was perseverating on wanting candy, difficult to re-direct for functional assessment. Due to deficits above and below pt requires up to max A for BADLs and safe mobility at this time. OT to follow to continue assessment. Recommend SNF at d/c/      Recommendations for follow up therapy are one component of a multi-disciplinary discharge planning process, led by the attending physician.  Recommendations may be updated based on patient status, additional functional criteria and insurance authorization.   Follow Up Recommendations  Skilled nursing-short term rehab (<3 hours/day)    Assistance Recommended at Discharge Frequent or constant Supervision/Assistance  Patient can return home with the following A lot of help with walking and/or transfers;A lot of help with bathing/dressing/bathroom;Assistance with cooking/housework;Direct supervision/assist for medications management;Assist for transportation;Help with stairs or ramp for entrance    Functional Status Assessment  Patient has had a recent decline in their functional status and demonstrates the ability to make significant  improvements in function in a reasonable and predictable amount of time.  Equipment Recommendations  Other (comment) (pending progression)    Recommendations for Other Services       Precautions / Restrictions Precautions Precautions: Fall Restrictions Weight Bearing Restrictions: No      Mobility Bed Mobility Overal bed mobility: Needs Assistance Bed Mobility: Supine to Sit, Sit to Supine     Supine to sit: Supervision Sit to supine: Supervision   General bed mobility comments: impulsively getting OOB upon arrival    Transfers Overall transfer level: Needs assistance Equipment used: 1 person hand held assist Transfers: Sit to/from Stand Sit to Stand: Mod assist           General transfer comment: min-mod A      Balance Overall balance assessment: Needs assistance Sitting-balance support: Feet supported Sitting balance-Leahy Scale: Fair     Standing balance support: No upper extremity supported Standing balance-Leahy Scale: Poor                             ADL either performed or assessed with clinical judgement   ADL Overall ADL's : Needs assistance/impaired Eating/Feeding: Supervision/ safety;Sitting   Grooming: Moderate assistance;Standing   Upper Body Bathing: Moderate assistance;Sitting   Lower Body Bathing: Maximal assistance;Sit to/from stand   Upper Body Dressing : Moderate assistance;Sitting   Lower Body Dressing: Maximal assistance;Sit to/from stand   Toilet Transfer: Maximal assistance;Ambulation Toilet Transfer Details (indicate cue type and reason): impulsively getting OOB due to urgency, no AD, very unsteady with limited insight Toileting- Clothing Manipulation and Hygiene: Moderate assistance;Sit to/from stand       Functional mobility during ADLs: Maximal assistance  General ADL Comments: maximal cues and physical assist to prevent fall - extremely impulsive, unsteady and not following commands     Vision Baseline  Vision/History: 0 No visual deficits Additional Comments: difficult to assess     Perception     Praxis      Pertinent Vitals/Pain Pain Assessment Pain Assessment: Faces Faces Pain Scale: No hurt Pain Intervention(s): Monitored during session     Hand Dominance     Extremity/Trunk Assessment Upper Extremity Assessment Upper Extremity Assessment: Generalized weakness;Difficult to assess due to impaired cognition   Lower Extremity Assessment Lower Extremity Assessment: Defer to PT evaluation   Cervical / Trunk Assessment Cervical / Trunk Assessment: Kyphotic   Communication Communication Communication: No difficulties   Cognition Arousal/Alertness: Awake/alert Behavior During Therapy: Impulsive Overall Cognitive Status: Impaired/Different from baseline Area of Impairment: Orientation, Attention, Following commands, Memory, Safety/judgement, Awareness, Problem solving                 Orientation Level: Disoriented to, Place, Time, Situation Current Attention Level: Sustained Memory: Decreased short-term memory Following Commands: Follows one step commands inconsistently Safety/Judgement: Decreased awareness of safety, Decreased awareness of deficits Awareness: Intellectual Problem Solving: Difficulty sequencing, Requires verbal cues General Comments: Oriented to self and "hospital." Pt impulsively getting OOB upon entry and could not be re-directed for safety. Pt followed <10% of functional commands     General Comments  VSS on RA    Exercises     Shoulder Instructions      Home Living Family/patient expects to be discharged to:: Private residence Living Arrangements: Non-relatives/Friends Available Help at Discharge: Friend(s);Available PRN/intermittently Type of Home: House Home Access: Stairs to enter [employer] of Steps: 1   Home Layout: One level                   Additional Comments: Lives with friend named, "Darryl," and  several other roommates? Pt is a poor historian      Prior Functioning/Environment Prior Level of Function : Independent/Modified Independent             Mobility Comments: Assume independent with mobility ADLs Comments: pt states she does not need help        OT Problem List: Decreased range of motion;Decreased strength;Decreased activity tolerance;Impaired balance (sitting and/or standing);Decreased knowledge of use of DME or AE;Decreased safety awareness;Decreased knowledge of precautions;Pain      OT Treatment/Interventions: Self-care/ADL training;Therapeutic exercise;DME and/or AE instruction;Patient/family education;Balance training;Therapeutic activities    OT Goals(Current goals can be found in the care plan section) Acute Rehab OT Goals Patient Stated Goal: to get some candy OT Goal Formulation: With patient Time For Goal Achievement: 12/23/21 Potential to Achieve Goals: Good ADL Goals Pt Will Perform Grooming: Independently;standing Pt Will Perform Lower Body Bathing: with modified independence;sit to/from stand Pt Will Perform Lower Body Dressing: sit to/from stand;with modified independence Pt Will Transfer to Toilet: with supervision;ambulating Pt Will Perform Toileting - Clothing Manipulation and hygiene: with modified independence;sitting/lateral leans  OT Frequency: Min 2X/week    Co-evaluation              AM-PAC OT "6 Clicks" Daily Activity     Outcome Measure Help from another person eating meals?: A Little Help from another person taking care of personal grooming?: A Little Help from another person toileting, which includes using toliet, bedpan, or urinal?: A Lot Help from another person bathing (including washing, rinsing, drying)?: A Lot Help from another person to put on and taking off regular  upper body clothing?: A Lot Help from another person to put on and taking off regular lower body clothing?: A Lot 6 Click Score: 14   End of Session  Nurse Communication: Mobility status (pt may need posey belt for safety)  Activity Tolerance: Patient tolerated treatment well Patient left: in bed;with call bell/phone within reach;with bed alarm set;with family/visitor present  OT Visit Diagnosis: Unsteadiness on feet (R26.81);Other abnormalities of gait and mobility (R26.89);Muscle weakness (generalized) (M62.81)                Time: 1636-1700 OT Time Calculation (min): 24 min Charges:  OT General Charges $OT Visit: 1 Visit OT Evaluation $OT Eval Moderate Complexity: 1 Mod OT Treatments $Self Care/Home Management : 8-22 mins   Laurabelle Gorczyca A Armando Lauman 12/09/2021, 5:41 PM

## 2021-12-09 NOTE — Progress Notes (Signed)
EEG complete - results pending 

## 2021-12-09 NOTE — Consult Note (Signed)
Reason for Consult:stroke Referring Physician: Agarwala  Deborah [name] is an 76 y.o. female.  HPI: Deborah [name] is a 76 year old African-American lady past medical history of hypertension, cocaine and alcohol abuse, stroke, hematuria.  [place] for altered mental status.  She was found to have significantly elevated blood pressure and was thought to have press syndrome with hypertensive emergency.She was not found to have any focal deficits on exam.  CT head showed white matter changes which are felt compatible with posterior distal encephalopathy syndrome and MRI was obtained which actually does not show those changes but shows a acute left thalamic lacunar infarct.  Patient mental status has improved but she remains disoriented and confused without any focal deficits.  Her urine drug screen was positive for cocaine. IV tPA considered no - She presented outside time with no focal deficits. Thrombectomy no evidence presentation not consistent with LVO  Past Surgical History:  Procedure Laterality Date   ABDOMINAL HYSTERECTOMY  04/13/1989   Per Dr. Lilian KapurMcDonald. Abdominal hysterectomy and bilateral salpingo-oophorectomy   FOOT SURGERY      Family History  Problem Relation Age of Onset   Cancer Father        colon cancer   Cancer Paternal Aunt        colon cancer   Breast cancer Neg Hx     Social History:  reports that she quit smoking about 10 years ago. Her smoking use included cigarettes. She smoked an average of .1 packs per day. She has quit using smokeless tobacco. She reports that she does not drink alcohol and does not use drugs.  Allergies: No Known Allergies  Medications: I have reviewed the patient's current medications.  Results for orders placed or performed during the hospital encounter of 12/08/21 (from the past 48 hour(s))  Comprehensive metabolic panel     Status: Abnormal   Collection Time: 12/08/21  3:50 PM  Result  Value Ref Range   Sodium 146 (H) 135 - 145 mmol/L   Potassium 3.6 3.5 - 5.1 mmol/L   Chloride 114 (H) 98 - 111 mmol/L   CO2 24 22 - 32 mmol/L   Glucose, Bld 78 70 - 99 mg/dL    Comment: Glucose reference range applies only to samples taken after fasting for at least 8 hours.   BUN 23 8 - 23 mg/dL   Creatinine, Ser 1.610.86 0.44 - 1.00 mg/dL   Calcium 9.2 8.9 - 09.610.3 mg/dL   Total Protein 6.4 (L) 6.5 - 8.1 g/dL   Albumin 3.6 3.5 - 5.0 g/dL   AST 20 15 - 41 U/L   ALT 13 0 - 44 U/L   Alkaline Phosphatase 91 38 - 126 U/L   Total Bilirubin 0.5 0.3 - 1.2 mg/dL   GFR, Estimated >04>60 >54>60 mL/min    Comment: (NOTE) Calculated using the CKD-EPI Creatinine Equation (2021)    Anion gap 8 5 - 15    Comment: Performed at [facility], [address] [place], [place] [id number]  CBC with Differential/Platelet     Status: Abnormal   Collection Time: 12/08/21  3:50 PM  Result Value Ref Range   WBC 4.6 4.0 - 10.5 K/uL   RBC 3.73 (L) 3.87 - 5.11 MIL/uL   Hemoglobin 11.7 (L) 12.0 - 15.0 g/dL   HCT 91.436.4 78.236.0 - 95.646.0 %   MCV 97.6 80.0 - 100.0 fL   MCH 31.4 26.0 - 34.0 pg  MCHC 32.1 30.0 - 36.0 g/dL   RDW 16.1 09.6 - 04.5 %   Platelets 340 150 - 400 K/uL   nRBC 0.0 0.0 - 0.2 %   Neutrophils Relative % 51 %   Neutro Abs 2.4 1.7 - 7.7 K/uL   Lymphocytes Relative 38 %   Lymphs Abs 1.7 0.7 - 4.0 K/uL   Monocytes Relative 6 %   Monocytes Absolute 0.3 0.1 - 1.0 K/uL   Eosinophils Relative 4 %   Eosinophils Absolute 0.2 0.0 - 0.5 K/uL   Basophils Relative 1 %   Basophils Absolute 0.0 0.0 - 0.1 K/uL   Immature Granulocytes 0 %   Abs Immature Granulocytes 0.01 0.00 - 0.07 K/uL    Comment: Performed at [facility], [address] [place], [place] [address]  Urinalysis, Routine w reflex microscopic     Status: Abnormal   Collection Time: 12/08/21  4:07 PM  Result Value Ref Range   Color, Urine YELLOW YELLOW   APPearance HAZY (A) CLEAR   Specific Gravity, Urine 1.016 1.005 - 1.030   pH 5.0  5.0 - 8.0   Glucose, UA NEGATIVE NEGATIVE mg/dL   Hgb urine dipstick LARGE (A) NEGATIVE   Bilirubin Urine NEGATIVE NEGATIVE   Ketones, ur NEGATIVE NEGATIVE mg/dL   Protein, ur NEGATIVE NEGATIVE mg/dL   Nitrite NEGATIVE NEGATIVE   Leukocytes,Ua NEGATIVE NEGATIVE   RBC / HPF 6-10 0 - 5 RBC/hpf   WBC, UA 0-5 0 - 5 WBC/hpf   Bacteria, UA RARE (A) NONE SEEN   Mucus PRESENT     Comment: Performed at [facility], [address] [place], [place] [address]  Lactic acid, plasma     Status: None   Collection Time: 12/08/21  4:07 PM  Result Value Ref Range   Lactic Acid, Venous 1.0 0.5 - 1.9 mmol/L    Comment: Performed at [facility], [address], [place], [place] [address]  Ethanol     Status: None   Collection Time: 12/08/21  4:07 PM  Result Value Ref Range   Alcohol, Ethyl (B) <10 <10 mg/dL    Comment: (NOTE) Lowest detectable limit for serum alcohol is 10 mg/dL.  For medical purposes only. Performed at [facility], [address] [place], [place] [address]   Rapid urine drug screen (hospital performed)     Status: Abnormal   Collection Time: 12/08/21  4:07 PM  Result Value Ref Range   Opiates NONE DETECTED NONE DETECTED   Cocaine POSITIVE (A) NONE DETECTED   Benzodiazepines POSITIVE (A) NONE DETECTED   Amphetamines NONE DETECTED NONE DETECTED   Tetrahydrocannabinol NONE DETECTED NONE DETECTED   Barbiturates NONE DETECTED NONE DETECTED    Comment: (NOTE) DRUG SCREEN FOR MEDICAL PURPOSES ONLY.  IF CONFIRMATION IS NEEDED FOR ANY PURPOSE, NOTIFY LAB WITHIN 5 DAYS.  LOWEST DETECTABLE LIMITS FOR URINE DRUG SCREEN Drug Class                     Cutoff (ng/mL) Amphetamine and metabolites    1000 Barbiturate and metabolites    200 Benzodiazepine                 200 Tricyclics and metabolites     300 Opiates and metabolites        300 Cocaine and metabolites        300 THC  50 Performed at [facility], [address] [place], [place] [address]   I-Stat venous blood gas, ED     Status: Abnormal   Collection Time: 12/08/21  4:24 PM  Result Value Ref Range   pH, Ven 7.410 7.25 - 7.43   pCO2, Ven 43.4 (L) 44 - 60 mmHg   pO2, Ven 74 (H) 32 - 45 mmHg   Bicarbonate 27.5 20.0 - 28.0 mmol/L   TCO2 29 22 - 32 mmol/L   O2 Saturation 95 %   Acid-Base Excess 2.0 0.0 - 2.0 mmol/L   Sodium 148 (H) 135 - 145 mmol/L   Potassium 3.4 (L) 3.5 - 5.1 mmol/L   Calcium, Ion 1.20 1.15 - 1.40 mmol/L   HCT 34.0 (L) 36.0 - 46.0 %   Hemoglobin 11.6 (L) 12.0 - 15.0 g/dL   Sample type VENOUS   Magnesium     Status: None   Collection Time: 12/08/21  6:50 PM  Result Value Ref Range   Magnesium 2.1 1.7 - 2.4 mg/dL    Comment: Performed at [facility], [address] [place], [place] [address]  Phosphorus     Status: None   Collection Time: 12/08/21  6:50 PM  Result Value Ref Range   Phosphorus 4.0 2.5 - 4.6 mg/dL    Comment: Performed at [facility], [address], [place], [place] [address]  Glucose, capillary     Status: Abnormal   Collection Time: 12/08/21  8:37 PM  Result Value Ref Range   Glucose-Capillary 57 (L) 70 - 99 mg/dL    Comment: Glucose reference range applies only to samples taken after fasting for at least 8 hours.  MRSA Next Gen by PCR, Nasal     Status: None   Collection Time: 12/08/21  8:51 PM   Specimen: Nasal Mucosa; Nasal Swab  Result Value Ref Range   MRSA by PCR Next Gen NOT DETECTED NOT DETECTED    Comment: (NOTE) The GeneXpert MRSA Assay (FDA approved for NASAL specimens only), is one component of a comprehensive MRSA colonization surveillance program. It is not intended to diagnose MRSA infection nor to guide or monitor treatment for MRSA infections. Test performance is not FDA approved in patients less than 63 years old. Performed at [facility], [address], [place], [place] [address]   Glucose, capillary     Status: Abnormal   Collection Time: 12/08/21  8:57  PM  Result Value Ref Range   Glucose-Capillary 185 (H) 70 - 99 mg/dL    Comment: Glucose reference range applies only to samples taken after fasting for at least 8 hours.  CBC     Status: Abnormal   Collection Time: 12/08/21 11:32 PM  Result Value Ref Range   WBC 5.4 4.0 - 10.5 K/uL   RBC 3.49 (L) 3.87 - 5.11 MIL/uL   Hemoglobin 11.2 (L) 12.0 - 15.0 g/dL   HCT 29.5 (L) 62.1 - 30.8 %   MCV 96.3 80.0 - 100.0 fL   MCH 32.1 26.0 - 34.0 pg   MCHC 33.3 30.0 - 36.0 g/dL   RDW 65.7 84.6 - 96.2 %   Platelets 291 150 - 400 K/uL   nRBC 0.0 0.0 - 0.2 %    Comment: Performed at [facility], [address] [place], [place] [address]  Creatinine, serum     Status: None   Collection Time: 12/08/21 11:32 PM  Result Value Ref Range  Creatinine, Ser 0.68 0.44 - 1.00 mg/dL   GFR, Estimated >16 >10 mL/min    Comment: (NOTE) Calculated using the CKD-EPI Creatinine Equation (2021) Performed at [facility], [address], [place], [place] [address]   Glucose, capillary     Status: Abnormal   Collection Time: 12/08/21 11:32 PM  Result Value Ref Range   Glucose-Capillary 101 (H) 70 - 99 mg/dL    Comment: Glucose reference range applies only to samples taken after fasting for at least 8 hours.  Glucose, capillary     Status: None   Collection Time: 12/09/21  3:38 AM  Result Value Ref Range   Glucose-Capillary 76 70 - 99 mg/dL    Comment: Glucose reference range applies only to samples taken after fasting for at least 8 hours.  Hemoglobin A1c     Status: None   Collection Time: 12/09/21  5:00 AM  Result Value Ref Range   Hgb A1c MFr Bld 5.5 4.8 - 5.6 %    Comment: (NOTE) Pre diabetes:          5.7%-6.4%  Diabetes:              >6.4%  Glycemic control for   <7.0% adults with diabetes    Mean Plasma Glucose 111.15 mg/dL    Comment: Performed at [facility], [address] [place], [place] [address]  CBC     Status: Abnormal   Collection Time: 12/09/21  5:21 AM  Result  Value Ref Range   WBC 4.6 4.0 - 10.5 K/uL   RBC 3.47 (L) 3.87 - 5.11 MIL/uL   Hemoglobin 11.1 (L) 12.0 - 15.0 g/dL   HCT 19.1 (L) 47.8 - 29.5 %   MCV 95.7 80.0 - 100.0 fL   MCH 32.0 26.0 - 34.0 pg   MCHC 33.4 30.0 - 36.0 g/dL   RDW 62.1 30.8 - 65.7 %   Platelets 288 150 - 400 K/uL   nRBC 0.0 0.0 - 0.2 %    Comment: Performed at [facility], [address], [place], [place] [address]  Basic metabolic panel     Status: Abnormal   Collection Time: 12/09/21  5:21 AM  Result Value Ref Range   Sodium 140 135 - 145 mmol/L    Comment: DELTA CHECK NOTED   Potassium 3.3 (L) 3.5 - 5.1 mmol/L   Chloride 109 98 - 111 mmol/L   CO2 25 22 - 32 mmol/L   Glucose, Bld 90 70 - 99 mg/dL    Comment: Glucose reference range applies only to samples taken after fasting for at least 8 hours.   BUN 13 8 - 23 mg/dL   Creatinine, Ser 2.95 0.44 - 1.00 mg/dL   Calcium 9.1 8.9 - 28.4 mg/dL   GFR, Estimated >13 >24 mL/min    Comment: (NOTE) Calculated using the CKD-EPI Creatinine Equation (2021)    Anion gap 6 5 - 15    Comment: Performed at [facility], [address], [place], [place] [address]  Magnesium     Status: None   Collection Time: 12/09/21  5:21 AM  Result Value Ref Range   Magnesium 1.9 1.7 - 2.4 mg/dL    Comment: Performed at [facility], [address], [place], [place] [address]  Phosphorus     Status: None   Collection Time: 12/09/21  5:21 AM  Result Value Ref Range   Phosphorus 3.6 2.5 - 4.6 mg/dL  Comment: Performed at [facility], [address] [place], [place] [address]  Glucose, capillary     Status: None   Collection Time: 12/09/21  8:03 AM  Result Value Ref Range   Glucose-Capillary 95 70 - 99 mg/dL    Comment: Glucose reference range applies only to samples taken after fasting for at least 8 hours.  Glucose, capillary     Status: Abnormal   Collection Time: 12/09/21 11:09 AM  Result Value Ref Range   Glucose-Capillary 109 (H) 70 - 99 mg/dL     Comment: Glucose reference range applies only to samples taken after fasting for at least 8 hours.    VAS US CAROTID (at [facility] and [facility] only)  Result Date: 12/09/2021 Carotid Arterial Duplex Study Patient Name:  Deborah Levels  Date of Exam:   12/09/2021 Medical Rec #: [id number]     Accession #:    [id number] Date of Birth: 1946-05-22    Patient Gender: F Patient Age:   76 years Exam Location:  [facility] Procedure:      VAS US CAROTID Referring Phys: Lynnell Catalan --------------------------------------------------------------------------------  Indications:  CVA. Risk Factors: Hypertension, past history of smoking, prior CVA. Performing Technologist: Magdalene River  Examination Guidelines: A complete evaluation includes B-mode imaging, spectral Doppler, color Doppler, and power Doppler as needed of all accessible portions of each vessel. Bilateral testing is considered an integral part of a complete examination. Limited examinations for reoccurring indications may be performed as noted.  Right Carotid Findings: +----------+--------+--------+--------+------------------+------------------+           PSV cm/sEDV cm/sStenosisPlaque DescriptionComments           +----------+--------+--------+--------+------------------+------------------+ CCA Prox  150     16                                intimal thickening +----------+--------+--------+--------+------------------+------------------+ CCA Distal81      24                                intimal thickening +----------+--------+--------+--------+------------------+------------------+ ICA Prox  38      14              calcific                             +----------+--------+--------+--------+------------------+------------------+ ICA Distal70      31                                                   +----------+--------+--------+--------+------------------+------------------+ ECA       72      9               heterogenous                          +----------+--------+--------+--------+------------------+------------------+ +----------+--------+-------+----------------+-------------------+           PSV cm/sEDV cmsDescribe        Arm Pressure (mmHG) +----------+--------+-------+----------------+-------------------+ Subclavian162     17     Multiphasic, WNL                    +----------+--------+-------+----------------+-------------------+ +---------+--------+--+--------+--+---------+ VertebralPSV cm/s39EDV cm/s13Antegrade +---------+--------+--+--------+--+---------+  Left Carotid Findings: +----------+--------+--------+--------+------------------+------------------+           PSV cm/sEDV cm/sStenosisPlaque DescriptionComments           +----------+--------+--------+--------+------------------+------------------+ CCA Prox  101     19                                intimal thickening +----------+--------+--------+--------+------------------+------------------+ CCA Distal59      17                                intimal thickening +----------+--------+--------+--------+------------------+------------------+ ICA Prox  25      8               heterogenous                         +----------+--------+--------+--------+------------------+------------------+ ICA Distal58      17                                                   +----------+--------+--------+--------+------------------+------------------+ ECA       54      11              heterogenous                         +----------+--------+--------+--------+------------------+------------------+ +----------+--------+--------+----------------+-------------------+           PSV cm/sEDV cm/sDescribe        Arm Pressure (mmHG) +----------+--------+--------+----------------+-------------------+ Subclavian150             Multiphasic, WNL                    +----------+--------+--------+----------------+-------------------+  +---------+--------+--+--------+--+---------+ VertebralPSV cm/s44EDV cm/s16Antegrade +---------+--------+--+--------+--+---------+   Summary: Right Carotid: Velocities in the right ICA are consistent with a 1-39% stenosis. Left Carotid: Velocities in the left ICA are consistent with a 1-39% stenosis. Vertebrals:  Bilateral vertebral arteries demonstrate antegrade flow. Subclavians: Normal flow hemodynamics were seen in bilateral subclavian              arteries. *See table(s) above for measurements and observations.     Preliminary    MR BRAIN WO CONTRAST  Result Date: 12/09/2021 CLINICAL DATA:  Encephalopathy altered mental status EXAM: MRI HEAD WITHOUT CONTRAST TECHNIQUE: Multiplanar, multiecho pulse sequences of the brain and surrounding structures were obtained without intravenous contrast. COMPARISON:  No prior MRI, correlation is made with CT head 12/08/2021 and 09/25/2017 FINDINGS: Brain: Restricted diffusion with ADC correlate in the left thalamus (series 9, images 74-77), likely acute infarct. No acute hemorrhage, mass, mass effect, or midline shift. Foci of hemosiderin deposition in the left hippocampus, lateral left temporal lobe, and left parieto-occipital region, likely sequela of prior hypertensive microhemorrhages. Confluent T2 hyperintense signal in the periventricular white matter, likely the sequela of moderate chronic small vessel ischemic disease. Vascular: Normal flow voids. Skull and upper cervical spine: Normal marrow signal. Sinuses/Orbits: Mucosal thickening in the ethmoid air cells. The orbits are unremarkable. Other: The mastoids are well aerated. IMPRESSION: 1. Acute infarct in the left thalamus. 2. No evidence of posterior reversible encephalopathy syndrome. The CT findings concerning for PRES correlate with what is favored to represent the sequela of worsening chronic small  vessel ischemic disease. These results were called by telephone at the time of interpretation on  12/09/2021 at 2:46 am to provider YAP , who verbally acknowledged these results. Electronically Signed   By: Wiliam Ke M.D.   On: 12/09/2021 02:46   DG Abd 1 View  Result Date: 12/08/2021 CLINICAL DATA:  [id number].  Metal screening. EXAM: ABDOMEN - 1 VIEW COMPARISON:  None Available. FINDINGS: The bowel gas pattern is normal. No radio-opaque calculi or other significant radiographic abnormality are seen. No unexpected retained metallic foreign body. IMPRESSION: No unexpected retained metallic foreign body. Electronically Signed   By: Helyn Numbers M.D.   On: 12/08/2021 22:18   DG Chest 1 View  Result Date: 12/08/2021 CLINICAL DATA:  [id number].  Metal screening EXAM: CHEST  1 VIEW COMPARISON:  12/01/2020 FINDINGS: Lungs are well expanded, symmetric, and clear. No pneumothorax or pleural effusion. Cardiac size within normal limits. Pulmonary vascularity is normal. Osseous structures are age-appropriate. No acute bone abnormality. No unexpected retained metallic foreign body. IMPRESSION: No active disease. Electronically Signed   By: Helyn Numbers M.D.   On: 12/08/2021 22:16   CT Head Wo Contrast  Result Date: 12/08/2021 CLINICAL DATA:  Mental status change, unknown cause EXAM: CT HEAD WITHOUT CONTRAST TECHNIQUE: Contiguous axial images were obtained from the base of the skull through the vertex without intravenous contrast. RADIATION DOSE REDUCTION: This exam was performed according to the departmental dose-optimization program which includes automated exposure control, adjustment of the mA and/or kV according to patient size and/or use of iterative reconstruction technique. COMPARISON:  CT head 09/25/17 BRAIN: BRAIN Interval development of white matter hypodensity within the parietal and occipital lobes. Cerebral ventricle sizes are concordant with the degree of cerebral volume loss. Patchy and confluent areas of decreased attenuation are noted throughout the deep and periventricular white matter of the  cerebral hemispheres bilaterally, compatible with chronic microvascular ischemic disease. No evidence of large-territorial acute infarction. No parenchymal hemorrhage. No mass lesion. No extra-axial collection. No mass effect or midline shift. No hydrocephalus. Basilar cisterns are patent. Vascular: No hyperdense vessel. Atherosclerotic calcifications are present within the cavernous internal carotid arteries. Skull: No acute fracture or focal lesion. Sinuses/Orbits: Paranasal sinuses and mastoid air cells are clear. The orbits are unremarkable. Other: None. IMPRESSION: Findings most compatible with posterior reversible encephalopathy syndrome. Consider MRI for further evaluation if clinically indicated. Electronically Signed   By: Tish Frederickson M.D.   On: 12/08/2021 16:39   CT-scan of the brain MRI examination of the brain.   CT-scan of the brain  Ultrasound   ROS Blood pressure (!) 184/87, pulse 67, temperature 97.9 F (36.6 C), temperature source Oral, resp. rate 17, height  (1.676 m), weight 56.2 kg, SpO2 (!) 87 %. Physical Exam Middle-age African-American lady not in distress. . Afebrile. Head is nontraumatic. Neck is supple without bruit.    Cardiac exam no murmur or gallop. Lungs are clear to auscultation. Distal pulses are well felt.   Neurological Exam ;  Awake  Alert oriented x 2.  Diminished attention, registration and recall.  Easy distractibility.  Poor insight into her condition..  Slight right left confusion.  Normal speech and language.eye movements full without nystagmus.fundi were not visualized. Vision acuity and fields appear normal. Hearing is normal. Palatal movements are normal. Face symmetric. Tongue midline. Normal strength, tone, reflexes and coordination. Normal sensation. Gait deferred.   1a Level of Conscious.: 0   1b LOC Questions: 0 1c LOC Commands: 2 2 Best Gaze: 0  3 Visual: 0 4 Facial Palsy: 0 5a Motor Arm - left: 0 5b Motor Arm - Right: 0 6a Motor  Leg - Left: 0 6b Motor Leg - Right: 0 7 Limb Ataxia: 0 8 Sensory: 1 9 Best Language: 0 10 Dysarthria: 0 11 Extinct. and Inatten.: 0 TOTAL: 3  Assessment/Plan:  76 year old African-American lady with altered mental status without focal deficits of unclear etiology.  Likely encephalopathy in the setting of cocaine abuse seizure with postictal confusion is also possible.  Abnormal MRI scan of the brain showing a small left thalamic lacunar infarct likely from small vessel disease which may be an incidental finding or as a result of cocaine vasculopathy. Recommend check EEG for seizure activity and CT angiogram of the brain and neck and echocardiogram.  Patient counseled to quit cocaine substance abuse.  Aspirin for stroke prevention.  No vertigo out of bed.  Physical occupational and speech therapy consults.  Transfer to neurology floor bed when available. Permissive hypertension.  Resume home blood pressure medications when able to swallow safely  Stroke team will follow.  Thank you for this consult.  Kindly call for questions. Discussed with Dr. Denese Killings This patient is critically ill and at significant risk of neurological worsening, death and care requires constant monitoring of vital signs, hemodynamics,respiratory and cardiac monitoring, extensive review of multiple databases, frequent neurological assessment, discussion with family, other specialists and medical decision making of high complexity.I have made any additions or clarifications directly to the above note.This critical care time does not reflect procedure time, or teaching time or supervisory time of PA/NP/Med Resident etc but could involve care discussion time.  I spent 30 minutes of neurocritical care time  in the care of  this patient.    Delia Heady, MD 12/09/2021, 3:23 PM    Note: This document was prepared with digital dictation and possible smart phrase technology. Any transcriptional errors that result from this process  are unintentional.

## 2021-12-09 NOTE — Progress Notes (Signed)
Bilateral carotid duplex study completed. Please see CV Proc for preliminary results.  Deborah [name] BS, RVT 12/09/2021 12:52 PM

## 2021-12-09 NOTE — Assessment & Plan Note (Signed)
Small left thalamic stroke seen on MRI.  Likely lacunar in origin from uncontrolled hypertension.  UDS also positive for cocaine.  At present no clear focal deficits.  Calm - sensorium clearing.   - Ready for transfer - Progressive BP reduction starting tomorrow.  - Secondary stroke prevention work up including US carotids, echo, lipid panel.  - Needs substance abuse counseling.  - PT and OT - Discharge planning: precarious home situation.

## 2021-12-09 NOTE — Assessment & Plan Note (Signed)
Clearing sensorium from possible hypertensive encephalopathy, stroke and residual drug effects.   - OT evaluation.  - Hold all sedation.

## 2021-12-09 NOTE — Progress Notes (Signed)
eLink Physician-Brief Progress Note Patient Name: Nalia Honeycutt DOB: April 04, 1946 MRN: [id number]   Date of Service  12/09/2021  HPI/Events of Note  Notified of hypoglycemia.  CBGs trending down.   eICU Interventions  Change IVFs to D5LR@75cc /hr.  CBG q4hrs.      Intervention Category Minor Interventions: Other:  Larinda Buttery 12/09/2021, 3:54 AM

## 2021-12-09 NOTE — Assessment & Plan Note (Signed)
--  TOC consultation 

## 2021-12-09 NOTE — H&P (Signed)
NAME:  Deborah [name], MRN:  [id number], DOB:  11-17-1945, LOS: 1 ADMISSION DATE:  12/08/2021, CONSULTATION DATE:  12/08/2021 REFERRING MD: Dr. Derwood Kaplan , CHIEF COMPLAINT: Encephalopathy  History of Present Illness:  Patient was brought into the hospital with altered mental status Activated EMS following being found down at [place] bus stop  Alert and interactive oriented to name when was picked up At earlier interacted with another EMS crew who noted she was alert and oriented x4, normal, transported home  Blood pressure noted to be high in the emergency department This is gradually trended down  Patient had a CT scan of the head concerning for pres History of cocaine abuse, current U-Tox positive for cocaine  Pertinent  Medical History   Past Medical History:  Diagnosis Date   Alcohol abuse, in remission    Anxiety    Asthma    Cocaine abuse (HCC)    CVA (cerebral vascular accident) (HCC)    Degenerative joint disease    back   Fracture of hand    Headache, chronic daily    Hematuria    Hypertension    Inadequate material resources    Mental/behavioral problem    S/P TAH-BSO    Tobacco abuse    Trichomonal vaginitis      Significant Hospital Events: Including procedures, antibiotic start and stop dates in addition to other pertinent events   CT head concerning for press MRI shows small left thalamic stroke   Interim History / Subjective:  Less encephalopathic today.   Objective   Blood pressure (!) 170/92, pulse (!) 55, temperature 97.9 F (36.6 C), temperature source Oral, resp. rate 14, height 5\' 6"  (1.676 m), weight 56.2 kg, SpO2 99 %.        Intake/Output Summary (Last 24 hours) at 12/09/2021 1141 Last data filed at 12/09/2021 1100 Gross per 24 hour  Intake 2409.38 ml  Output 1100 ml  Net 1309.38 ml    Filed Weights   12/08/21 2036 12/09/21 0500  Weight: 56.1 kg 56.2 kg    Examination: General: Chronically ill-appearing, elderly, unkempt HENT:  Dry oral mucosa Lungs: Clear breath sounds bilaterally Cardiovascular: S1-S2 appreciated Abdomen: Soft, bowel sounds appreciated Extremities: No clubbing, no edema Neuro: Slow responses but follows commands in all limbs and is oriented.  GU: voiding spontaneously.   Ancillary tests personally reviewed:     Assessment & Plan:   Cerebrovascular accident (CVA) of left thalamus (HCC) Small left thalamic stroke seen on MRI.  Likely lacunar in origin from uncontrolled hypertension.  UDS also positive for cocaine.  At present no clear focal deficits.  Calm - sensorium clearing.   - Ready for transfer - Progressive BP reduction starting tomorrow.  - Secondary stroke prevention work up including 12/11/21 carotids, echo, lipid panel.  - Needs substance abuse counseling.  - PT and OT - Discharge planning: precarious home situation.   Acute metabolic encephalopathy Clearing sensorium from possible hypertensive encephalopathy, stroke and residual drug effects.   - OT evaluation.  - Hold all sedation.   Inadequate material resources - TOC consultation.    Best Practice (right click and "Reselect all SmartList Selections" daily)   Diet/type: regular diet DVT prophylaxis: LMWH GI prophylaxis: N/A Lines: N/A Foley:  N/A Code Status:  full code Last date of multidisciplinary goals of care discussion [pending]   [place], MD [facility] ICU Physician [facility]  Pager: [phone] Or Epic Secure Chat After hours: [phone].  12/09/2021, 11:50 AM

## 2021-12-10 ENCOUNTER — Inpatient Hospital Stay (HOSPITAL_COMMUNITY): Payer: Medicare Other

## 2021-12-10 DIAGNOSIS — I6381 Other cerebral infarction due to occlusion or stenosis of small artery: Secondary | ICD-10-CM | POA: Diagnosis not present

## 2021-12-10 DIAGNOSIS — I6389 Other cerebral infarction: Secondary | ICD-10-CM | POA: Diagnosis not present

## 2021-12-10 DIAGNOSIS — G9341 Metabolic encephalopathy: Secondary | ICD-10-CM | POA: Diagnosis not present

## 2021-12-10 LAB — ECHOCARDIOGRAM COMPLETE
AR max vel: 2.46 cm2
AV Area VTI: 2.25 cm2
AV Area mean vel: 2.25 cm2
AV Mean grad: 10 mmHg
AV Peak grad: 17 mmHg
Ao pk vel: 2.06 m/s
Area-P 1/2: 2.87 cm2
Calc EF: 78.5 %
Height: 66 in
S' Lateral: 1.9 cm
Single Plane A2C EF: 77.8 %
Single Plane A4C EF: 79.5 %
Weight: 1947.1 oz

## 2021-12-10 LAB — GLUCOSE, CAPILLARY
Glucose-Capillary: 107 mg/dL — ABNORMAL HIGH (ref 70–99)
Glucose-Capillary: 79 mg/dL (ref 70–99)
Glucose-Capillary: 80 mg/dL (ref 70–99)
Glucose-Capillary: 88 mg/dL (ref 70–99)
Glucose-Capillary: 92 mg/dL (ref 70–99)
Glucose-Capillary: 92 mg/dL (ref 70–99)

## 2021-12-10 LAB — LIPID PANEL
Cholesterol: 157 mg/dL (ref 0–200)
HDL: 71 mg/dL (ref >40)
LDL Cholesterol: 75 mg/dL (ref 0–99)
Total CHOL/HDL Ratio: 2.2 RATIO
Triglycerides: 54 mg/dL (ref <150)
VLDL: 11 mg/dL (ref 0–40)

## 2021-12-10 MED ORDER — LORAZEPAM 2 MG/ML IJ SOLN
1.0000 mg | INTRAMUSCULAR | Status: DC | PRN
  Administered 2021-12-11 – 2021-12-22 (×13): 1 mg via INTRAVENOUS
  Filled 2021-12-10 (×13): qty 1

## 2021-12-10 MED ORDER — ORAL CARE MOUTH RINSE: 15.0000 mL | OROMUCOSAL | Status: DC | PRN

## 2021-12-10 MED ORDER — HALOPERIDOL LACTATE 5 MG/ML IJ SOLN
2.0000 mg | Freq: Four times a day (QID) | INTRAMUSCULAR | Status: DC | PRN
  Administered 2021-12-10 – 2021-12-22 (×8): 2 mg via INTRAVENOUS
  Filled 2021-12-10 (×10): qty 1

## 2021-12-10 NOTE — TOC Initial Note (Signed)
Transition of Care [facility]) - Initial/Assessment Note    Patient Details  Name: Deborah [name] MRN: [id number] Date of Birth: October 04, 1945  [facility] CM/SW Contact:    Dale Muir, Student-Social Work Phone Number: 12/10/2021, 3:16 PM  Clinical Narrative:                 MSW intern reached out to [facility], friend of Ms. Eliot Ford. MSW intern asked about any possible family Ms. Hampe may have. Darryl stated Ms. Lacomb does not have any family local, but she does have some family in [place] and [place]. However, there were no family names mentioned. MSW intern then asked about a Management consultant. Darryl stated he was unaware of any family members able to do so, but he would try and help with any decisions as he was available. Darryl is also local to [place]. TOC will continue to follow throughout discharge.   Expected Discharge Plan: Skilled Nursing Facility Barriers to Discharge: Continued Medical Work up   Patient Goals and CMS Choice        Expected Discharge Plan and Services Expected Discharge Plan: Skilled Nursing Facility In-house Referral: Clinical Social Work Discharge Planning Services: CM Consult   Living arrangements for the past 2 months: Single Family Home                                      Prior Living Arrangements/Services Living arrangements for the past 2 months: Single Family Home   Patient language and need for interpreter reviewed:: Yes              Criminal Activity/Legal Involvement Pertinent to Current Situation/Hospitalization: No - Comment as needed  Activities of Daily Living      Permission Sought/Granted Permission sought to share information with : Family Supports Permission granted to share information with : Yes, Verbal Permission Granted  Share Information with NAME: Lidia Collum     Permission granted to share info w Relationship: Friend  Permission granted to share info w Contact Information:  [phone]  Emotional Assessment       Orientation: : Oriented to Self Alcohol / Substance Use: Not Applicable Psych Involvement: No (comment)  Admission diagnosis:  Acute metabolic encephalopathy [G93.41] Patient Active Problem List   Diagnosis Date Noted   Cerebrovascular accident (CVA) of left thalamus (HCC) 12/09/2021   Substance abuse (HCC)    Acute metabolic encephalopathy 12/08/2021   Syncope 03/29/2019   Loss of consciousness (HCC) 07/06/2017   Loss of weight 04/30/2016   Lower back pain 03/04/2016   Atopic dermatitis 10/10/2015   History of CVA (cerebrovascular accident) 09/05/2015   Health maintenance examination 10/29/2011   Primary osteoarthritis of both knees 02/28/2011   Depression 08/10/2009   DJD (degenerative joint disease) 07/03/2006   Essential hypertension 06/08/2006   ASTHMA 06/08/2006   TAH/BSO, HX OF 06/08/2006   PCP:  Oneita Hurt No Pharmacy:   [facility] - [place], [place] - [address] [place] [address] Phone: [phone] Fax: [phone]     Social Determinants of Health (SDOH) Interventions    Readmission Risk Interventions     No data to display

## 2021-12-10 NOTE — Progress Notes (Addendum)
STROKE TEAM PROGRESS NOTE   INTERVAL HISTORY No family is at the bedside.  Patient is lethargic and responds to noxious stimuli. Does not follow commands due to encephalopathy.  He moves all 4 extremities well without focal weakness.  EEG shows no seizure activity.  CT angiogram shows severe right M2 stenosis. Vitals:   12/10/21 0500 12/10/21 0600 12/10/21 0700 12/10/21 0800  BP: (!) 205/95 (!) 175/85 (!) 181/91 (!) 182/140  Pulse: 66 (!) 59 61 61  Resp: 13 13 16  (!) 22  Temp:    (!) 97.2 F (36.2 C)  TempSrc:    Axillary  SpO2: 100% 99% 100% 99%  Weight: 55.2 kg     Height:       CBC:  Recent Labs  Lab 12/08/21 1550 12/08/21 1624 12/08/21 2332 12/09/21 0521  WBC 4.6  --  5.4 4.6  NEUTROABS 2.4  --   --   --   HGB 11.7*   < > 11.2* 11.1*  HCT 36.4   < > 33.6* 33.2*  MCV 97.6  --  96.3 95.7  PLT 340  --  291 288   < > = values in this interval not displayed.   Basic Metabolic Panel:  Recent Labs  Lab 12/08/21 1550 12/08/21 1624 12/08/21 1850 12/08/21 2332 12/09/21 0521  NA 146* 148*  --   --  140  K 3.6 3.4*  --   --  3.3*  CL 114*  --   --   --  109  CO2 24  --   --   --  25  GLUCOSE 78  --   --   --  90  BUN 23  --   --   --  13  CREATININE 0.86  --   --  0.68 0.62  CALCIUM 9.2  --   --   --  9.1  MG  --   --  2.1  --  1.9  PHOS  --   --  4.0  --  3.6   Lipid Panel:  Recent Labs  Lab 12/10/21 0833  CHOL 157  TRIG 54  HDL 71  CHOLHDL 2.2  VLDL 11  LDLCALC 75   HgbA1c:  Recent Labs  Lab 12/09/21 0500  HGBA1C 5.5   Urine Drug Screen:  Recent Labs  Lab 12/08/21 1607  LABOPIA NONE DETECTED  COCAINSCRNUR POSITIVE*  LABBENZ POSITIVE*  AMPHETMU NONE DETECTED  THCU NONE DETECTED  LABBARB NONE DETECTED    Alcohol Level  Recent Labs  Lab 12/08/21 1607  ETH <10    IMAGING past 24 hours ECHOCARDIOGRAM COMPLETE  Result Date: 12/10/2021    ECHOCARDIOGRAM REPORT   Patient Name:   12/12/2021 Date of Exam: 12/10/2021 Medical Rec #:  12/12/2021     Height:       66.0 in Accession #:    [id number]   Weight:       121.7 lb Date of Birth:  1946-01-29   BSA:          1.619 m Patient Age:    75 years     BP:           182/140 mmHg Patient Gender: F            HR:           67 bpm. Exam Location:  Inpatient Procedure: 2D Echo, 3D Echo, Cardiac Doppler, Color Doppler and Strain Analysis Indications:    Stroke. I63.9  History:  Patient has prior history of Echocardiogram examinations, most                 recent 07/06/2017. Stroke, Signs/Symptoms:Syncope; Risk                 Factors:Hypertension. Cocaine use.  Sonographer:    Sheralyn Boatman RDCS Referring Phys: [id number] Lynnell Catalan  Sonographer Comments: Technically difficult study due to poor echo windows. Image acquisition challenging due to patient behavioral factors. and Image acquisition challenging due to uncooperative patient. Patient attempted to bite, narrowly escaped. Difficult exam. Patient moving througout exam. Patient in bilateral restraints and posey belt. Study interrupted by tech. IMPRESSIONS  1. Left ventricular ejection fraction, by estimation, is 70 to 75%. The left ventricle has hyperdynamic function. The left ventricle has no regional wall motion abnormalities. There is moderate left ventricular hypertrophy. Left ventricular diastolic parameters are consistent with Grade I diastolic dysfunction (impaired relaxation). Mid cavitary gradient (resting) 17 mm Hg, likely a component of hypertrophy and hyperdymanic function. Patient unable to Valsalva. GLS - 24 %, a normal reflection of function. There ia apical-basal sparring pattern on regional strain comparison but with suboptimal basal strain acquistion. Overall hypertrophy less suggestive of cardiac amyloidosis.  2. Right ventricular systolic function is normal. The right ventricular size is normal. Mildly increased right ventricular wall thickness. Tricuspid regurgitation signal is inadequate for assessing PA pressure.  3. The mitral valve is  grossly normal. No evidence of mitral valve regurgitation.  4. The aortic valve was not well visualized. Aortic valve regurgitation is not visualized.  5. The pericardial effusion is posterior and lateral to the left ventricle.  6. The inferior vena cava is normal in size with greater than 50% respiratory variability, suggesting right atrial pressure of 3 mmHg. Comparison(s): Similar to prior, peak mid cavitary gradient slightly lower than prior study. FINDINGS  Left Ventricle: Left ventricular ejection fraction, by estimation, is 70 to 75%. The left ventricle has hyperdynamic function. The left ventricle has no regional wall motion abnormalities. The left ventricular internal cavity size was small. There is moderate left ventricular hypertrophy. Left ventricular diastolic parameters are consistent with Grade I diastolic dysfunction (impaired relaxation). Right Ventricle: The right ventricular size is normal. Mildly increased right ventricular wall thickness. Right ventricular systolic function is normal. Tricuspid regurgitation signal is inadequate for assessing PA pressure. Left Atrium: Left atrial size was normal in size. Right Atrium: Right atrial size was normal in size. Pericardium: Trivial pericardial effusion is present. The pericardial effusion is posterior and lateral to the left ventricle. Mitral Valve: The mitral valve is grossly normal. No evidence of mitral valve regurgitation. Tricuspid Valve: The tricuspid valve is normal in structure. Tricuspid valve regurgitation is not demonstrated. No evidence of tricuspid stenosis. Aortic Valve: The aortic valve was not well visualized. Aortic valve regurgitation is not visualized. Aortic valve mean gradient measures 10.0 mmHg. Aortic valve peak gradient measures 17.0 mmHg. Aortic valve area, by VTI measures 2.25 cm. Pulmonic Valve: The pulmonic valve was not well visualized. Pulmonic valve regurgitation is not visualized. Aorta: The aortic root is normal in  size and structure. Venous: The inferior vena cava is normal in size with greater than 50% respiratory variability, suggesting right atrial pressure of 3 mmHg. IAS/Shunts: No atrial level shunt detected by color flow Doppler.  LEFT VENTRICLE PLAX 2D LVIDd:         2.90 cm     Diastology LVIDs:         1.90 cm  LV e' medial:    5.33 cm/s LV PW:         1.30 cm     LV E/e' medial:  13.3 LV IVS:        1.40 cm     LV e' lateral:   6.98 cm/s LVOT diam:     2.00 cm     LV E/e' lateral: 10.2 LV SV:         83 LV SV Index:   51 LVOT Area:     3.14 cm  LV Volumes (MOD) LV vol d, MOD A2C: 54.1 ml LV vol d, MOD A4C: 76.4 ml LV vol s, MOD A2C: 12.0 ml LV vol s, MOD A4C: 15.7 ml LV SV MOD A2C:     42.1 ml LV SV MOD A4C:     76.4 ml LV SV MOD BP:      50.7 ml RIGHT VENTRICLE             IVC RV S prime:     13.30 cm/s  IVC diam: 1.70 cm TAPSE (M-mode): 2.3 cm LEFT ATRIUM             Index        RIGHT ATRIUM           Index LA diam:        3.10 cm 1.91 cm/m   RA Area:     11.00 cm LA Vol (A2C):   16.6 ml 10.25 ml/m  RA Volume:   25.10 ml  15.50 ml/m LA Vol (A4C):   37.7 ml 23.28 ml/m LA Biplane Vol: 24.5 ml 15.13 ml/m  AORTIC VALVE AV Area (Vmax):    2.46 cm AV Area (Vmean):   2.25 cm AV Area (VTI):     2.25 cm AV Vmax:           206.00 cm/s AV Vmean:          139.500 cm/s AV VTI:            0.370 m AV Peak Grad:      17.0 mmHg AV Mean Grad:      10.0 mmHg LVOT Vmax:         161.00 cm/s LVOT Vmean:        99.800 cm/s LVOT VTI:          0.265 m LVOT/AV VTI ratio: 0.72  AORTA Ao Root diam: 2.80 cm MITRAL VALVE MV Area (PHT): 2.87 cm     SHUNTS MV Decel Time: 264 msec     Systemic VTI:  0.26 m MV E velocity: 71.10 cm/s   Systemic Diam: 2.00 cm MV A velocity: 111.00 cm/s MV E/A ratio:  0.64 Riley Lam MD Electronically signed by Riley Lam MD Signature Date/Time: 12/10/2021/1:44:24 PM    Final    EEG adult  Result Date: 12/09/2021 Charlsie Quest, MD     12/09/2021  5:59 PM Patient Name: Charlize Hathaway MRN: [id number] Epilepsy Attending: Charlsie Quest Referring Physician/Provider: Micki Riley, MD Date: 12/09/2021 Duration: 23.33 mins Patient history: 76 year old African-American lady with altered mental status without focal deficits of unclear etiology. EEG to evaluate for seizure Level of alertness: Awake, asleep AEDs during EEG study: None Technical aspects: This EEG study was done with scalp electrodes positioned according to the 10-20 International system of electrode placement. Electrical activity was acquired at a sampling rate of 500Hz  and reviewed with a high frequency filter of 70Hz  and a low frequency filter of  1Hz . EEG data were recorded continuously and digitally stored. Description: The posterior dominant rhythm consists 8 Hz activity of moderate voltage (25-35 uV) seen predominantly in posterior head regions, symmetric and reactive to eye opening and eye closing. Sleep was characterized by vertex waves, sleep spindles (12 to 14 Hz), maximal frontocentral region. Hyperventilation and photic stimulation were not performed.   IMPRESSION: This study is within normal limits. No seizures or epileptiform discharges were seen throughout the recording.   CT ANGIO HEAD NECK W WO CM  Result Date: 12/09/2021 CLINICAL DATA:  Stroke, follow-up. EXAM: CT ANGIOGRAPHY HEAD AND NECK TECHNIQUE: Multidetector CT imaging of the head and neck was performed using the standard protocol during bolus administration of intravenous contrast. Multiplanar CT image reconstructions and MIPs were obtained to evaluate the vascular anatomy. Carotid stenosis measurements (when applicable) are obtained utilizing NASCET criteria, using the distal internal carotid diameter as the denominator. RADIATION DOSE REDUCTION: This exam was performed according to the departmental dose-optimization program which includes automated exposure control, adjustment of the mA and/or kV according to patient size and/or use  of iterative reconstruction technique. CONTRAST:  73mL OMNIPAQUE IOHEXOL 350 MG/ML SOLN COMPARISON:  Brain MRI 12/09/2021.  Head CT 12/08/2021. FINDINGS: CT HEAD FINDINGS Brain: Mild generalized parenchymal atrophy. Known acute infarct within the left thalamus, similar in extent as compared to the brain MRI performed earlier today. Moderate patchy and ill-defined hypoattenuation within the cerebral white matter, nonspecific but compatible with chronic small vessel ischemic disease. There is no acute intracranial hemorrhage. No demarcated cortical infarct. No extra-axial fluid collection. No evidence of an intracranial mass. No midline shift. Vascular: No hyperdense vessel.  Atherosclerotic calcifications. Skull: No fracture or aggressive osseous lesion. Sinuses: Small-volume fluid scattered within the right ethmoid air cells. Minimal mucosal thickening within the anterior left ethmoid air cells. 5 mm osteoma within an anterior left ethmoid air cell. Orbits: Orbital mass or acute orbital finding. ASPECTS [facility] Stroke Program Early CT Score) - Ganglionic level infarction (caudate, lentiform nuclei, internal capsule, insula, M1-M3 cortex): 7 - Supraganglionic infarction (M4-M6 cortex): 3 Total score (0-10 with 10 being normal): 10 These results were communicated to Dr. [facility] at 3:19 pmon 6/19/2023by text page via the [facility] messaging system. Review of the MIP images confirms the above findings CTA NECK FINDINGS Aortic arch: Standard aortic branching. Atherosclerotic plaque within the proximal major branch vessels of the neck. Streak and beam hardening artifact arising from a dense left-sided contrast bolus partially obscures the left subclavian artery. Within this limitation, there is no appreciable hemodynamically significant innominate or proximal subclavian artery stenosis. Right carotid system: CCA and ICA patent within the neck without hemodynamically stenosis (50% or greater). Mild atherosclerotic plaque about  the carotid bifurcation and within the proximal ICA. Left carotid system: CCA and ICA patent within the neck without hemodynamically significant stenosis (50% or greater). Mild atherosclerotic plaque about the carotid bifurcation and within the proximal ICA. Vertebral arteries: Vertebral arteries codominant and patent within the neck without stenosis. Skeleton: Straightening of the expected cervical lordosis. Vertebral ankylosis at C5-C6 and C6-C7. Cervical spondylosis. No acute fracture or aggressive osseous lesion. Other neck: No neck mass or cervical lymphadenopathy. Upper chest: No consolidation within the imaged lung apices. Review of the MIP images confirms the above findings CTA HEAD FINDINGS Anterior circulation: The intracranial internal carotid arteries are patent. Non-stenotic atherosclerotic plaque within both vessels. The M1 middle cerebral arteries are patent. No M2 proximal branch occlusion is identified. Severe stenosis within a proximal  M2 right MCA vessel (series 17, image 18). The anterior cerebral arteries are patent. No intracranial aneurysm is identified. Posterior circulation: The intracranial vertebral arteries are patent. The basilar artery is patent. The posterior cerebral arteries are patent. Posterior communicating arteries are diminutive or absent, bilaterally. Venous sinuses: Within the limitations of contrast timing, no convincing thrombus. Anatomic variants: As described. Review of the MIP images confirms the above findings No emergent large vessel occlusion identified. This finding and CTA head impression #2 communicated to Dr. Pearlean Brownie at 3:33 pm on 12/09/2021 by text page via the [facility] messaging system. IMPRESSION: CT head: 1. Known acute infarct within the left thalamus, similar in extent as compared to the brain MRI performed earlier today. 2. No acute intracranial hemorrhage or acute demarcated cortical infarction. 3. Moderate chronic small vessel ischemic changes within the  cerebral white matter. 4. Mild generalized parenchymal atrophy. 5. Paranasal sinus disease, as described. CTA neck: 1. The common carotid, internal carotid and vertebral arteries are patent within the neck without hemodynamically significant stenosis. Mild atherosclerotic plaque about the carotid bifurcations and within the proximal ICAs. 2.  Aortic Atherosclerosis (ICD10-I70.0). CTA head: 1. No intracranial large vessel occlusion is identified. 2. Severe stenosis within a proximal M2 right MCA vessel. 3. Non-stenotic atherosclerotic plaque within the intracranial internal carotid arteries, bilaterally. Electronically Signed   By: Jackey Loge D.O.   On: 12/09/2021 15:35    PHYSICAL EXAM Physical Exam Middle-age African-American lady not in distress. . Afebrile. Head is nontraumatic. Neck is supple without bruit.  Cardiac exam no murmur or gallop. Lungs are clear to auscultation. Distal pulses are well felt.    Neurological Exam ;  Mental Status: Orientation:  lethargic and confused   Attention/Concentration: Impaired Fund of Knowledge:  impaired Language/Speech: Did not speak or follow commands today due to encephalopathy and recent sedation with Ativan 2mg  IV.   Cranial Nerves:  Pupils: Equal and Reactive  Visual Fields: Blink to Threat grossly intact B/L Optic Disc: Poorly visualized  CN III, IV, VI (Extra-Ocular Movements): EOMI, No nystagmus, and Normal saccades CN V: Normal sensation in V1, V2, V3 bilaterally withdraws to touch CN VII: Normal, symmetric facial muscle strength bilaterally CN VIII: Auditory acuity intact to bedside testing CN IX/X:  unable to assess CN XI: Normal strength of bilateral trapezius and SCM muscles  CN XII: Tongue protrudes midline   eye movements full without nystagmus.fundi were not visualized. Vision acuity and fields appear normal. Hearing is normal. Palatal movements are normal. Face symmetric. Tongue midline. Normal strength, tone, reflexes and  coordination. Normal sensation. Gait deferred.     1a Level of Conscious.: 0   1b LOC Questions: 0 1c LOC Commands: 2 2 Best Gaze: 0 3 Visual: 0 4 Facial Palsy: 0 5a Motor Arm - left: 0 5b Motor Arm - Right: 0 6a Motor Leg - Left: 0 6b Motor Leg - Right: 0 7 Limb Ataxia: 0 8 Sensory: 1 9 Best Language: 0 10 Dysarthria: 0 11 Extinct. and Inatten.: 0 TOTAL: 3  ASSESSMENT [name] is a 76 y.o. female with history of hypertension, cocaine and alcohol abuse, stroke, hematuria.  [place] for altered mental status.  She was found to have significantly elevated blood pressure and was thought to have press syndrome with hypertensive emergency.She was not found to have any focal deficits on exam.  CT head showed white matter changes which are felt compatible with posterior distal  encephalopathy syndrome and MRI was obtained which actually does not show those changes but shows a acute left thalamic lacunar infarct.  Patient mental status has improved but she remains disoriented and confused without any focal deficits.  Her urine drug screen was positive for cocaine. IV tPA considered no - She presented outside time with no focal deficits. Thrombectomy no evidence presentation not consistent with LVO    STROKE WORK UP Stroke: Acute left thalamic lacunar infarct likely due to HTN emergency and cocaine use CT head - No acute abnormality. Concern for PRES that was ruled out on MRI. ASPECTS score  10. Aortic Atherosclerosis (ICD10-I70.0).Moderate chronic small vessel ischemic changes within the cerebral white matter.Moderate patchy and ill-defined hypoattenuation within the cerebral white matter, nonspecific but compatible with chronic small vessel ischemic disease. CTA head & neck - NO LVO MRI  Acute infarct in the left thalamus.No evidence of posterior reversible encephalopathy syndrome. The CT findings concerning for PRES correlate  with what is favored to represent the sequela of worsening chronic small vessel ischemic 2D Echo - EF 70-75% and no shunt EEG Adult- study is within normal limits. No seizures or epileptiform discharges were seen throughout the recording. LDL 75 HgbA1c 5.5 VTE prophylaxis - heparin 5000 units    Diet   Diet Heart Room service appropriate? Yes; Fluid consistency: Thin   aspirin 81 mg daily prior to admission, now on aspirin 325 mg daily.  Therapy recommendations:  SNF Disposition:  likely SNF when encephalopathy improved  Hypertension Home meds:  HCTZ- 25mg  daily, Lisinopril-40mg  daily Unstable 130/70-205/95 Permissive hypertension (OK if < 220/120) but gradually normalize in 5-7 days Long-term BP goal normotensive  Hyperlipidemia LDL 75, goal < 70 Add Lipitor 20mg    High intensity statin not indicated  Continue statin at discharge  Other Stroke Risk Factors Advanced Age >/= 5265  Cigarette smoker and advised to stop smoking ETOH use, alcohol level <10, advised to drink no more than 1-2 drink(s) a day Substance abuse - UDS: Cocaine POSITIVE. Patient advised to stop using due to stroke risk. Hx stroke/TIA  Other Active Problems Acute metabolic Encephalopathy- improving  Hospital day # 2  Delphia GratesJessica Ridley Jones, AGNP-BC [facility] [phone]  From 7a-7p please page over night on-call Neurologist  I have personally obtained history,examined this patient, reviewed notes, independently viewed imaging studies, participated in medical decision making and plan of care.ROS completed by me personally and pertinent positives fully documented  I have made any additions or clarifications directly to the above note. Agree with note above.  Patient remains confused and encephalopathic likely due to cocaine intoxication.  Continue aspirin for stroke prevention maintain aggressive risk factor modification.  Patient counseled to quit cocaine cigarettes and drugs.  Stroke team will sign  off.  Kindly call for questions.  Discussed with Dr.Girguis.  Greater than 50% time during this 35-minute visit was spent in counseling and coordination of care and discussion patient care team and answering questions.  Delia HeadyPramod Loris Winrow, MD Medical Director [facility] Pager: [phone] 12/10/2021 4:24 PM   To contact Stroke Continuity provider, please refer to [email or web address]. After hours, contact General Neurology

## 2021-12-10 NOTE — Hospital Course (Signed)
Deborah [name] is a 76 yo female with PMH anxiety, asthma, cocaine use, history of CVA, HTN, tobacco use, alcohol use who was brought to the hospital with altered mentation and found being down at a bus stop. Urine drug screen was recently positive for cocaine and benzos.  Initial CT head was concerning for PRES syndrome; she underwent MRI brain which showed acute infarct involving left thalamus and ruled out PRES. Due to ongoing altered mentation, she also underwent EEG which ruled out underlying seizure activity.

## 2021-12-10 NOTE — Progress Notes (Signed)
Progress Note    Darianny Momon   TGG:[id number]  DOB: [birth year]  DOA: 12/08/2021     2 PCP: Pcp, No  Initial CC: AMS  Hospital Course: Ms. Blumberg is a 76 yo female with PMH anxiety, asthma, cocaine use, history of CVA, HTN, tobacco use, alcohol use who was brought to the hospital with altered mentation and found being down at a bus stop. Urine drug screen was recently positive for cocaine and benzos.  Initial CT head was concerning for PRES syndrome; she underwent MRI brain which showed acute infarct involving left thalamus and ruled out PRES. Due to ongoing altered mentation, she also underwent EEG which ruled out underlying seizure activity.  Interval History:  Patient still altered this morning when seen and had received Ativan earlier in the morning.  She could nod head at times to some questions but was unable to follow commands or open eyes.  Assessment and Plan: * Acute metabolic encephalopathy - seizure activity ruled out per neurology and with EEG - etiology presumed multifactorial from substance use, hypertensive encephalopathy, and CVA - continue PRN ativan or haldol for agitation - continue restraints if still neede  Cerebrovascular accident (CVA) of left thalamus (HCC) - MRI on admission shows acute infarct involving left thalamus -Counseled on drug use cessation - Continue aspirin - Neurology following, appreciate assistance - Follow-up PT/OT eval's once mentation improves  Substance abuse (HCC) - UDS noted with cocaine on admission - TOC consult placed    Old records reviewed in assessment of this patient  Antimicrobials:   DVT prophylaxis:  heparin injection 5,000 Units Start: 12/08/21 2200   Code Status:   Code Status: Full Code  Disposition Plan: Pending evals Status is: Inpatient  Objective: Blood pressure (!) 182/140, pulse 61, temperature (!) 97.2 F (36.2 C), temperature source Axillary, resp. rate (!) 22, height 5\' 6"  (1.676 m), weight  55.2 kg, SpO2 99 %.  Examination:  Physical Exam Constitutional:      Comments: Thin, disheveled, and unkempt elderly woman lying in bed in no distress but is noninteractive  HENT:     Head: Normocephalic and atraumatic.     Mouth/Throat:     Mouth: Mucous membranes are moist.  Eyes:     Comments: Pinpoint pupils bilaterally  Cardiovascular:     Rate and Rhythm: Normal rate and regular rhythm.  Pulmonary:     Effort: Pulmonary effort is normal.     Breath sounds: Normal breath sounds.  Abdominal:     General: Bowel sounds are normal. There is no distension.     Palpations: Abdomen is soft.  Musculoskeletal:        General: Normal range of motion.     Cervical back: Normal range of motion and neck supple.  Skin:    General: Skin is warm and dry.  Neurological:     Comments: Unable to fully assess.  Patient not interactive      Consultants:  Neurology  Procedures:    Data Reviewed: Results for orders placed or performed during the hospital encounter of 12/08/21 (from the past 24 hour(s))  Glucose, capillary     Status: None   Collection Time: 12/09/21  3:25 PM  Result Value Ref Range   Glucose-Capillary 87 70 - 99 mg/dL  Glucose, capillary     Status: Abnormal   Collection Time: 12/09/21  9:46 PM  Result Value Ref Range   Glucose-Capillary 138 (H) 70 - 99 mg/dL  Glucose, capillary     Status:  Abnormal   Collection Time: 12/10/21 12:45 AM  Result Value Ref Range   Glucose-Capillary 107 (H) 70 - 99 mg/dL  Glucose, capillary     Status: None   Collection Time: 12/10/21  5:02 AM  Result Value Ref Range   Glucose-Capillary 92 70 - 99 mg/dL  Glucose, capillary     Status: None   Collection Time: 12/10/21  7:12 AM  Result Value Ref Range   Glucose-Capillary 92 70 - 99 mg/dL  Lipid panel     Status: None   Collection Time: 12/10/21  8:33 AM  Result Value Ref Range   Cholesterol 157 0 - 200 mg/dL   Triglycerides 54 <329 mg/dL   HDL 71 >51 mg/dL   Total CHOL/HDL  Ratio 2.2 RATIO   VLDL 11 0 - 40 mg/dL   LDL Cholesterol 75 0 - 99 mg/dL  Glucose, capillary     Status: None   Collection Time: 12/10/21 11:32 AM  Result Value Ref Range   Glucose-Capillary 80 70 - 99 mg/dL    I have Reviewed nursing notes, Vitals, and Lab results since pt's last encounter. Pertinent lab results : see above I have ordered test including BMP, CBC, Mg I have reviewed the last note from staff over past 24 hours I have discussed pt's care plan and test results with nursing staff, case manager   LOS: 2 days   Lewie Chamber, MD [facility] 12/10/2021, 2:40 PM

## 2021-12-10 NOTE — Assessment & Plan Note (Signed)
-   UDS noted with cocaine on admission - TOC consult placed

## 2021-12-10 NOTE — Progress Notes (Signed)
Responded to bed alarm in patient room to find that the pt had gotten out of her posey belt and was attempting to walk around the room.  She stated that she was trying to clean up and repeatedly attempted to pick up trash from the floor.  There was no trash on the floor, and when nursing staff attempted to redirect the patient back into the bed she became severely agitated and combative.  She was attempting to hit and kick staff and attempted to bite staff during implementation of restraints.  Orders obtained for soft wrist restraints and ativan IV push.  Pt guided back into bed by nursing staff, ativan administered and restraints correctly fastened.  Pt appears to be in a calmer state after interventions but is still agitated and difficult to redirect and reorient.  VS stable. Will continue to monitor.

## 2021-12-10 NOTE — Assessment & Plan Note (Signed)
-   MRI on admission shows acute infarct involving left thalamus -Counseled on drug use cessation - Continue aspirin - Neurology following, appreciate assistance - Follow-up PT/OT eval's once mentation improves

## 2021-12-10 NOTE — Progress Notes (Signed)
SLP Cancellation Note  Patient Details Name: Deborah [name] MRN: [id number] DOB: [birth year]   Cancelled treatment:        Attempted to see pt for cognitive linguistic evaluation. Pt with severe agitation/combativeness overnight.  Now heavily sedated.  Spoke with RN.  Pt is not appropriate for assessment at this time.  SLP will continue to follow for readiness for evaluation.   Kerrie Pleasure, MA, CCC-SLP Acute Rehabilitation Services Office: [phone] 12/10/2021, 9:32 AM

## 2021-12-10 NOTE — Progress Notes (Signed)
Physical Therapy Treatment Patient Details Name: Deborah [name] MRN: [id number] DOB: [birth year] Today's Date: 12/10/2021   History of Present Illness Pt is a 76 y.o. F who was found down at the bus stop and admitted 12/08/2021. MRI showing small left thalamic stroke. Significant PMH: alcohol and cocaine abuse, CVA, HTN.    PT Comments    Patient lethargic, but initially agreeable to mobility so restraints removed and attempted to assist pt to sit up on EOB, but returned to supine and resisting further efforts to get up even for assist for bed linen change.  She was assisted with +2 A for positioning in bed with restraints reapplied and assist for changing soiled bed linen.  PT will continue to follow.  Likely will improve when more alert.  PT will follow.    Recommendations for follow up therapy are one component of a multi-disciplinary discharge planning process, led by the attending physician.  Recommendations may be updated based on patient status, additional functional criteria and insurance authorization.  Follow Up Recommendations  Skilled nursing-short term rehab (<3 hours/day) Can patient physically be transported by private vehicle: Yes   Assistance Recommended at Discharge Frequent or constant Supervision/Assistance  Patient can return home with the following A little help with walking and/or transfers;A little help with bathing/dressing/bathroom;Assistance with cooking/housework;Direct supervision/assist for medications management;Assist for transportation;Help with stairs or ramp for entrance   Equipment Recommendations  None recommended by PT    Recommendations for Other Services       Precautions / Restrictions Precautions Precautions: Fall Precaution Comments: posey, mitts     Mobility  Bed Mobility Overal bed mobility: Needs Assistance Bed Mobility: Supine to Sit     Supine to sit: Mod assist     General bed mobility comments: lifting help for trunk to sit up,  pt with legs off bed already despite posey and wrist restraints; scooted up in bed wtih +2 A and positioning for comfort as needed    Transfers                   General transfer comment: attempted to encourage ambulation or just OOB to [facility] while bed changed, but pt resisting and lying back down    Ambulation/Gait                   Stairs             Wheelchair Mobility    Modified Rankin (Stroke Patients Only)       Balance Overall balance assessment: Needs assistance   Sitting balance-Leahy Scale: Zero Sitting balance - Comments: resisting sitting up                                    Cognition Arousal/Alertness: Lethargic Behavior During Therapy: Restless, Impulsive Overall Cognitive Status: Difficult to assess                                          Exercises      General Comments        Pertinent Vitals/Pain Pain Assessment Faces Pain Scale: No hurt    Home Living                          Prior Function  PT Goals (current goals can now be found in the care plan section) Progress towards PT goals: Not progressing toward goals - comment    Frequency    Min 3X/week      PT Plan Current plan remains appropriate    Co-evaluation              AM-PAC PT "6 Clicks" Mobility   Outcome Measure  Help needed turning from your back to your side while in a flat bed without using bedrails?: A Little Help needed moving from lying on your back to sitting on the side of a flat bed without using bedrails?: A Lot Help needed moving to and from a bed to a chair (including a wheelchair)?: Total Help needed standing up from a chair using your arms (e.g., wheelchair or bedside chair)?: Total Help needed to walk in hospital room?: Total Help needed climbing 3-5 steps with a railing? : Total 6 Click Score: 9    End of Session   Activity Tolerance: Treatment limited secondary to  agitation Patient left: in bed;with call bell/phone within reach;with restraints reapplied   PT Visit Diagnosis: Other abnormalities of gait and mobility (R26.89);Other symptoms and signs involving the nervous system (R29.898)     Time: 7829-5621 PT Time Calculation (min) (ACUTE ONLY): 16 min  Charges:  $Therapeutic Activity: 8-22 mins                     Sheran Lawless, PT Acute Rehabilitation Services [phone] Office:[phone] 12/10/2021    Elray Mcgregor 12/10/2021, 6:29 PM

## 2021-12-10 NOTE — Progress Notes (Signed)
OT Cancellation Note  Patient Details Name: Deborah [name] MRN: [id number] DOB: 01/17/1946   Cancelled Treatment:    Reason Eval/Treat Not Completed: Patient's level of consciousness (Pt unable to rouse for functional participation despite max efforts. OT treatment to f/u as appropriate.)  Lebaron Bautch A Rennae Ferraiolo 12/10/2021, 2:19 PM

## 2021-12-10 NOTE — Progress Notes (Signed)
  Echocardiogram 2D Echocardiogram has been performed.  Deborah [name] 12/10/2021, 11:53 AM

## 2021-12-10 NOTE — Assessment & Plan Note (Addendum)
-   seizure activity ruled out per neurology and with EEG - etiology presumed multifactorial from substance use, hypertensive encephalopathy, and CVA - continue PRN ativan or haldol for agitation - continue restraints if still needed -Mentation is a little more improved today, hoping that this continues

## 2021-12-11 DIAGNOSIS — G9341 Metabolic encephalopathy: Secondary | ICD-10-CM | POA: Diagnosis not present

## 2021-12-11 DIAGNOSIS — I6381 Other cerebral infarction due to occlusion or stenosis of small artery: Secondary | ICD-10-CM | POA: Diagnosis not present

## 2021-12-11 LAB — BASIC METABOLIC PANEL
Anion gap: 10 (ref 5–15)
BUN: 16 mg/dL (ref 8–23)
CO2: 24 mmol/L (ref 22–32)
Calcium: 9.6 mg/dL (ref 8.9–10.3)
Chloride: 108 mmol/L (ref 98–111)
Creatinine, Ser: 0.83 mg/dL (ref 0.44–1.00)
GFR, Estimated: >60 mL/min (ref >60)
Glucose, Bld: 86 mg/dL (ref 70–99)
Potassium: 3.7 mmol/L (ref 3.5–5.1)
Sodium: 142 mmol/L (ref 135–145)

## 2021-12-11 LAB — CBC WITH DIFFERENTIAL/PLATELET
Abs Immature Granulocytes: 0.01 10*3/uL (ref 0.00–0.07)
Basophils Absolute: 0 10*3/uL (ref 0.0–0.1)
Basophils Relative: 0 %
Eosinophils Absolute: 0 10*3/uL (ref 0.0–0.5)
Eosinophils Relative: 1 %
HCT: 40.9 % (ref 36.0–46.0)
Hemoglobin: 13.6 g/dL (ref 12.0–15.0)
Immature Granulocytes: 0 %
Lymphocytes Relative: 26 %
Lymphs Abs: 1.6 10*3/uL (ref 0.7–4.0)
MCH: 31.5 pg (ref 26.0–34.0)
MCHC: 33.3 g/dL (ref 30.0–36.0)
MCV: 94.7 fL (ref 80.0–100.0)
Monocytes Absolute: 0.4 10*3/uL (ref 0.1–1.0)
Monocytes Relative: 6 %
Neutro Abs: 4.2 10*3/uL (ref 1.7–7.7)
Neutrophils Relative %: 67 %
Platelets: 340 10*3/uL (ref 150–400)
RBC: 4.32 MIL/uL (ref 3.87–5.11)
RDW: 14.1 % (ref 11.5–15.5)
WBC: 6.3 10*3/uL (ref 4.0–10.5)
nRBC: 0 % (ref 0.0–0.2)

## 2021-12-11 LAB — GLUCOSE, CAPILLARY
Glucose-Capillary: 149 mg/dL — ABNORMAL HIGH (ref 70–99)
Glucose-Capillary: 68 mg/dL — ABNORMAL LOW (ref 70–99)
Glucose-Capillary: 73 mg/dL (ref 70–99)
Glucose-Capillary: 82 mg/dL (ref 70–99)
Glucose-Capillary: 86 mg/dL (ref 70–99)

## 2021-12-11 LAB — MAGNESIUM: Magnesium: 2 mg/dL (ref 1.7–2.4)

## 2021-12-11 MED ORDER — LORAZEPAM 2 MG/ML IJ SOLN
1.0000 mg | Freq: Once | INTRAMUSCULAR | Status: AC
  Administered 2021-12-11: 1 mg via INTRAVENOUS

## 2021-12-11 MED ORDER — FLUOXETINE HCL 20 MG PO CAPS
20.0000 mg | ORAL_CAPSULE | Freq: Every day | ORAL | Status: DC
  Administered 2021-12-11 – 2021-12-27 (×17): 20 mg via ORAL
  Filled 2021-12-11 (×17): qty 1

## 2021-12-11 MED ORDER — POTASSIUM CHLORIDE CRYS ER 20 MEQ PO TBCR
40.0000 meq | EXTENDED_RELEASE_TABLET | Freq: Once | ORAL | Status: AC
  Administered 2021-12-11: 40 meq via ORAL
  Filled 2021-12-11: qty 2

## 2021-12-11 MED ORDER — SODIUM CHLORIDE 0.9 % IV BOLUS
1000.0000 mL | Freq: Once | INTRAVENOUS | Status: AC
  Administered 2021-12-11: 1000 mL via INTRAVENOUS

## 2021-12-11 MED ORDER — OLANZAPINE 2.5 MG PO TABS
2.5000 mg | ORAL_TABLET | Freq: Every day | ORAL | Status: DC
  Administered 2021-12-11: 2.5 mg via ORAL
  Filled 2021-12-11 (×3): qty 1

## 2021-12-11 NOTE — Evaluation (Signed)
Speech Language Pathology Evaluation Patient Details Name: Deborah [name] MRN: [id number] DOB: 1945-07-10 Today's Date: 12/11/2021 Time:  -     Problem List:  Patient Active Problem List   Diagnosis Date Noted   Cerebrovascular accident (CVA) of left thalamus (HCC) 12/09/2021   Substance abuse (HCC)    Acute metabolic encephalopathy 12/08/2021   Syncope 03/29/2019   Loss of consciousness (HCC) 07/06/2017   Loss of weight 04/30/2016   Lower back pain 03/04/2016   Atopic dermatitis 10/10/2015   History of CVA (cerebrovascular accident) 09/05/2015   Health maintenance examination 10/29/2011   Primary osteoarthritis of both knees 02/28/2011   Depression 08/10/2009   DJD (degenerative joint disease) 07/03/2006   Essential hypertension 06/08/2006   ASTHMA 06/08/2006   TAH/BSO, HX OF 06/08/2006   Past Medical History:  Past Medical History:  Diagnosis Date   Alcohol abuse, in remission    Anxiety    Asthma    Cocaine abuse (HCC)    CVA (cerebral vascular accident) (HCC)    Degenerative joint disease    back   Fracture of hand    Headache, chronic daily    Hematuria    Hypertension    Inadequate material resources    Mental/behavioral problem    S/P TAH-BSO    Tobacco abuse    Trichomonal vaginitis    Past Surgical History:  Past Surgical History:  Procedure Laterality Date   ABDOMINAL HYSTERECTOMY  04/13/1989   Per Dr. Lilian Kapur. Abdominal hysterectomy and bilateral salpingo-oophorectomy   FOOT SURGERY     HPI:  Ms. Bittel is a 76 yo female with PMH anxiety, asthma, cocaine use, history of CVA, HTN, tobacco use, alcohol use who was brought to the hospital with altered mentation and found being down at a bus stop.  Urine drug screen was recently positive for cocaine and benzos.  Initial CT head was concerning for PRES syndrome; she underwent MRI brain which showed acute infarct involving left thalamus and ruled out PRES.   Assessment / Plan / Recommendation Clinical  Impression  Pt demonstrates moderate cognitive impairment including attention, memory, orientation and safety awareness. When asked to complete a simple naming task pt becomes repetitive without awareness. Pt verbalizes that she has had difficulty with memory prior to admission and has assist from her partner. Expect ongoing acute improvement but pt will need full supervision and assist.    SLP Assessment  SLP Recommendation/Assessment: Patient needs continued Speech Lanaguage Pathology Services SLP Visit Diagnosis: Cognitive communication deficit (R41.841)    Recommendations for follow up therapy are one component of a multi-disciplinary discharge planning process, led by the attending physician.  Recommendations may be updated based on patient status, additional functional criteria and insurance authorization.    Follow Up Recommendations       Assistance Recommended at Discharge     Functional Status Assessment    Frequency and Duration min 2x/week  2 weeks      SLP Evaluation Cognition  Overall Cognitive Status: Impaired/Different from baseline Arousal/Alertness: Awake/alert Orientation Level: Oriented to person;Disoriented to place;Disoriented to time;Disoriented to situation Attention: Focused;Sustained Focused Attention: Appears intact Sustained Attention: Impaired Sustained Attention Impairment: Verbal basic;Functional basic Memory: Impaired Memory Impairment: Storage deficit;Retrieval deficit Awareness: Impaired Awareness Impairment: Emergent impairment;Anticipatory impairment Problem Solving: Impaired Problem Solving Impairment: Verbal basic;Functional basic Safety/Judgment: Impaired       Comprehension  Auditory Comprehension Overall Auditory Comprehension: Appears within functional limits for tasks assessed    Expression Verbal Expression Overall Verbal Expression: Appears within functional  limits for tasks assessed   Oral / Motor  Motor Speech Overall Motor  Speech: Appears within functional limits for tasks assessed            Benard Minturn, Riley Nearing 12/11/2021, 2:33 PM

## 2021-12-11 NOTE — ED Provider Notes (Incomplete)
[facility] Provider Note   CSN: [id number] Arrival date & time: 12/08/21  1533     History {Add pertinent medical, surgical, social history, OB history to HPI:1} No chief complaint on file.   Deborah [name] is a 76 y.o. female.  HPI     Patient was brought into the hospital with altered mental status Activated EMS following being found down at a bus stop  Home Medications Prior to Admission medications   Medication Sig Start Date End Date Taking? Authorizing Provider  memantine (NAMENDA) 5 MG tablet Take 5 mg by mouth 2 (two) times daily. 08/28/21  Yes [provider]  aspirin 81 MG tablet Take 1 tablet (81 mg total) by mouth daily. 03/04/16   Lorella Nimrod, MD  FLUoxetine (PROZAC) 20 MG capsule Take 20 mg by mouth daily. 11/26/20   [provider]  hydrochlorothiazide (HYDRODIURIL) 25 MG tablet Take 25 mg by mouth daily. 11/26/20   [provider]  lisinopril (ZESTRIL) 40 MG tablet Take 40 mg by mouth daily. 11/26/20   [provider]  OLANZapine (ZYPREXA) 2.5 MG tablet Take 2.5 mg by mouth at bedtime.    [provider]  Vitamin D, Cholecalciferol, 25 MCG (1000 UT) TABS Take 1 tablet by mouth daily. 02/08/21   [provider]      Allergies    Patient has no known allergies.    Review of Systems   Review of Systems  Physical Exam Updated Vital Signs BP (!) 160/90   Pulse 72   Temp (!) 97.4 F (36.3 C) (Axillary)   Resp 12   Ht 5\' 6"  (1.676 m)   Wt 52.2 kg   SpO2 98%   BMI 18.57 kg/m  Physical Exam  ED Results / Procedures / Treatments   Labs (all labs ordered are listed, but only abnormal results are displayed) Labs Reviewed  COMPREHENSIVE METABOLIC PANEL - Abnormal; Notable for the following components:      Result Value   Sodium 146 (*)    Chloride 114 (*)    Total Protein 6.4 (*)    All other components within normal limits  CBC WITH DIFFERENTIAL/PLATELET - Abnormal; Notable for  the following components:   RBC 3.73 (*)    Hemoglobin 11.7 (*)    All other components within normal limits  URINALYSIS, ROUTINE W REFLEX MICROSCOPIC - Abnormal; Notable for the following components:   APPearance HAZY (*)    Hgb urine dipstick LARGE (*)    Bacteria, UA RARE (*)    All other components within normal limits  RAPID URINE DRUG SCREEN, HOSP PERFORMED - Abnormal; Notable for the following components:   Cocaine POSITIVE (*)    Benzodiazepines POSITIVE (*)    All other components within normal limits  CBC - Abnormal; Notable for the following components:   RBC 3.47 (*)    Hemoglobin 11.1 (*)    HCT 33.2 (*)    All other components within normal limits  BASIC METABOLIC PANEL - Abnormal; Notable for the following components:   Potassium 3.3 (*)    All other components within normal limits  CBC - Abnormal; Notable for the following components:   RBC 3.49 (*)    Hemoglobin 11.2 (*)    HCT 33.6 (*)    All other components within normal limits  GLUCOSE, CAPILLARY - Abnormal; Notable for the following components:   Glucose-Capillary 57 (*)    All other components within normal limits  GLUCOSE, CAPILLARY - Abnormal;  Notable for the following components:   Glucose-Capillary 185 (*)    All other components within normal limits  GLUCOSE, CAPILLARY - Abnormal; Notable for the following components:   Glucose-Capillary 101 (*)    All other components within normal limits  GLUCOSE, CAPILLARY - Abnormal; Notable for the following components:   Glucose-Capillary 109 (*)    All other components within normal limits  GLUCOSE, CAPILLARY - Abnormal; Notable for the following components:   Glucose-Capillary 138 (*)    All other components within normal limits  GLUCOSE, CAPILLARY - Abnormal; Notable for the following components:   Glucose-Capillary 107 (*)    All other components within normal limits  GLUCOSE, CAPILLARY - Abnormal; Notable for the following components:    Glucose-Capillary 149 (*)    All other components within normal limits  I-STAT VENOUS BLOOD GAS, ED - Abnormal; Notable for the following components:   pCO2, Ven 43.4 (*)    pO2, Ven 74 (*)    Sodium 148 (*)    Potassium 3.4 (*)    HCT 34.0 (*)    Hemoglobin 11.6 (*)    All other components within normal limits  MRSA NEXT GEN BY PCR, NASAL  LACTIC ACID, PLASMA  ETHANOL  MAGNESIUM  PHOSPHORUS  MAGNESIUM  PHOSPHORUS  CREATININE, SERUM  GLUCOSE, CAPILLARY  GLUCOSE, CAPILLARY  HEMOGLOBIN A1C  GLUCOSE, CAPILLARY  GLUCOSE, CAPILLARY  LIPID PANEL  GLUCOSE, CAPILLARY  GLUCOSE, CAPILLARY  GLUCOSE, CAPILLARY  CBC WITH DIFFERENTIAL/PLATELET  GLUCOSE, CAPILLARY  GLUCOSE, CAPILLARY  BASIC METABOLIC PANEL  MAGNESIUM    EKG EKG Interpretation  Date/Time:  Sunday December 08 2021 15:49:50 EDT Ventricular Rate:  71 PR Interval:  141 QRS Duration: 85 QT Interval:  395 QTC Calculation: 430 R Axis:   38 Text Interpretation: Sinus rhythm RAE, consider biatrial enlargement Consider left ventricular hypertrophy Tall R wave in V2, consider RVH or PMI No acute changes No significant change since last tracing Confirmed by ,  ([id number]) on 12/09/2021 4:44:59 PM  Radiology ECHOCARDIOGRAM COMPLETE  Result Date: 12/10/2021    ECHOCARDIOGRAM REPORT   Patient Name:   Deborah [name] Date of Exam: 12/10/2021 Medical Rec #:  [id number]    Height:       66.0 in Accession #:    [id number]   Weight:       121.7 lb Date of Birth:  12/12/1945   BSA:          1.619 m Patient Age:    75 years     BP:           182/140 mmHg Patient Gender: F            HR:           67  bpm. Exam Location:  Inpatient Procedure: 2D Echo, 3D Echo, Cardiac Doppler, Color Doppler and Strain Analysis Indications:    Stroke. I63.9  History:        Patient has prior history of Echocardiogram examinations, most                 recent 07/06/2017. Stroke, Signs/Symptoms:Syncope; Risk                 Factors:Hypertension. Cocaine use.   Sonographer:    07/08/2017 RDCS Referring Phys: Sheralyn Boatman [id number]  Sonographer Comments: Technically difficult study due to poor echo windows. Image acquisition challenging due to patient behavioral factors. and Image acquisition challenging due to uncooperative patient. Patient attempted to bite, narrowly escaped. Difficult exam.  Patient moving througout exam. Patient in bilateral restraints and posey belt. Study interrupted by tech. IMPRESSIONS  1. Left ventricular ejection fraction, by estimation, is 70 to 75%. The left ventricle has hyperdynamic function. The left ventricle has no regional wall motion abnormalities. There is moderate left ventricular hypertrophy. Left ventricular diastolic parameters are consistent with Grade I diastolic dysfunction (impaired relaxation). Mid cavitary gradient (resting) 17 mm Hg, likely a component of hypertrophy and hyperdymanic function. Patient unable to Valsalva. GLS - 24 %, a normal reflection of function. There ia apical-basal sparring pattern on regional strain comparison but with suboptimal basal strain acquistion. Overall hypertrophy less suggestive of cardiac amyloidosis.  2. Right ventricular systolic function is normal. The right ventricular size is normal. Mildly increased right ventricular wall thickness. Tricuspid regurgitation signal is inadequate for assessing PA pressure.  3. The mitral valve is grossly normal. No evidence of mitral valve regurgitation.  4. The aortic valve was not well visualized. Aortic valve regurgitation is not visualized.  5. The pericardial effusion is posterior and lateral to the left ventricle.  6. The inferior vena cava is normal in size with greater than 50% respiratory variability, suggesting right atrial pressure of 3 mmHg. Comparison(s): Similar to prior, peak mid cavitary gradient slightly lower than prior study. FINDINGS  Left Ventricle: Left ventricular ejection fraction, by estimation, is 70 to 75%. The left ventricle has  hyperdynamic function. The left ventricle has no regional wall motion abnormalities. The left ventricular internal cavity size was small. There is moderate left ventricular hypertrophy. Left ventricular diastolic parameters are consistent with Grade I diastolic dysfunction (impaired relaxation). Right Ventricle: The right ventricular size is normal. Mildly increased right ventricular wall thickness. Right ventricular systolic function is normal. Tricuspid regurgitation signal is inadequate for assessing PA pressure. Left Atrium: Left atrial size was normal in size. Right Atrium: Right atrial size was normal in size. Pericardium: Trivial pericardial effusion is present. The pericardial effusion is posterior and lateral to the left ventricle. Mitral Valve: The mitral valve is grossly normal. No evidence of mitral valve regurgitation. Tricuspid Valve: The tricuspid valve is normal in structure. Tricuspid valve regurgitation is not demonstrated. No evidence of tricuspid stenosis. Aortic Valve: The aortic valve was not well visualized. Aortic valve regurgitation is not visualized. Aortic valve mean gradient measures 10.0 mmHg. Aortic valve peak gradient measures 17.0 mmHg. Aortic valve area, by VTI measures 2.25 cm. Pulmonic Valve: The pulmonic valve was not well visualized. Pulmonic valve regurgitation is not visualized. Aorta: The aortic root is normal in size and structure. Venous: The inferior vena cava is normal in size with greater than 50% respiratory variability, suggesting right atrial pressure of 3 mmHg. IAS/Shunts: No atrial level shunt detected by color flow Doppler.  LEFT VENTRICLE PLAX 2D LVIDd:         2.90 cm     Diastology LVIDs:         1.90 cm     LV e' medial:    5.33 cm/s LV PW:         1.30 cm     LV E/e' medial:  13.3 LV IVS:        1.40 cm     LV e' lateral:   6.98 cm/s LVOT diam:     2.00 cm     LV E/e' lateral: 10.2 LV SV:         83 LV SV Index:   51 LVOT Area:     3.14 cm  LV Volumes (MOD)  LV vol d, MOD A2C: 54.1 ml LV vol d, MOD A4C: 76.4 ml LV vol s, MOD A2C: 12.0 ml LV vol s, MOD A4C: 15.7 ml LV SV MOD A2C:     42.1 ml LV SV MOD A4C:     76.4 ml LV SV MOD BP:      50.7 ml RIGHT VENTRICLE             IVC RV S prime:     13.30 cm/s  IVC diam: 1.70 cm TAPSE (M-mode): 2.3 cm LEFT ATRIUM             Index        RIGHT ATRIUM           Index LA diam:        3.10 cm 1.91 cm/m   RA Area:     11.00 cm LA Vol (A2C):   16.6 ml 10.25 ml/m  RA Volume:   25.10 ml  15.50 ml/m LA Vol (A4C):   37.7 ml 23.28 ml/m LA Biplane Vol: 24.5 ml 15.13 ml/m  AORTIC VALVE AV Area (Vmax):    2.46 cm AV Area (Vmean):   2.25 cm AV Area (VTI):     2.25 cm AV Vmax:           206.00 cm/s AV Vmean:          139.500 cm/s AV VTI:            0.370 m AV Peak Grad:      17.0 mmHg AV Mean Grad:      10.0 mmHg LVOT Vmax:         161.00 cm/s LVOT Vmean:        99.800 cm/s LVOT VTI:          0.265 m LVOT/AV VTI ratio: 0.72  AORTA Ao Root diam: 2.80 cm MITRAL VALVE MV Area (PHT): 2.87 cm     SHUNTS MV Decel Time: 264 msec     Systemic VTI:  0.26 m MV E velocity: 71.10 cm/s   Systemic Diam: 2.00 cm MV A velocity: 111.00 cm/s MV E/A ratio:  0.64 Rudean Haskell MD Electronically signed by Rudean Haskell MD Signature Date/Time: 12/10/2021/1:44:24 PM    Final    VAS US CAROTID (at [facility] and [facility] only)  Result Date: 12/10/2021 Carotid Arterial Duplex Study Patient Name:  Deborah [name]  Date of Exam:   12/09/2021 Medical Rec #: [id number]     Accession #:    [id number] Date of Birth: 1945/08/22    Patient Gender: F Patient Age:   19 years Exam Location:  [facility] Procedure:      VAS US CAROTID Referring Phys: Kipp Brood --------------------------------------------------------------------------------  Indications:  CVA. Risk Factors: Hypertension, past history of smoking, prior CVA. Performing Technologist: Bobetta Lime  Examination Guidelines: A complete evaluation includes B-mode imaging, spectral Doppler, color  Doppler, and power Doppler as needed of all accessible portions of each vessel. Bilateral testing is considered an integral part of a complete examination. Limited examinations for reoccurring indications may be performed as noted.  Right Carotid Findings: +----------+--------+--------+--------+------------------+------------------+           PSV cm/sEDV cm/sStenosisPlaque DescriptionComments           +----------+--------+--------+--------+------------------+------------------+ CCA Prox  150     16  intimal thickening +----------+--------+--------+--------+------------------+------------------+ CCA Distal81      24                                intimal thickening +----------+--------+--------+--------+------------------+------------------+ ICA Prox  38      14              calcific                             +----------+--------+--------+--------+------------------+------------------+ ICA Distal70      31                                                   +----------+--------+--------+--------+------------------+------------------+ ECA       72      9               heterogenous                         +----------+--------+--------+--------+------------------+------------------+ +----------+--------+-------+----------------+-------------------+           PSV cm/sEDV cmsDescribe        Arm Pressure (mmHG) +----------+--------+-------+----------------+-------------------+ Subclavian162     17     Multiphasic, WNL                    +----------+--------+-------+----------------+-------------------+ +---------+--------+--+--------+--+---------+ VertebralPSV cm/s39EDV cm/s13Antegrade +---------+--------+--+--------+--+---------+  Left Carotid Findings: +----------+--------+--------+--------+------------------+------------------+           PSV cm/sEDV cm/sStenosisPlaque DescriptionComments            +----------+--------+--------+--------+------------------+------------------+ CCA Prox  101     19                                intimal thickening +----------+--------+--------+--------+------------------+------------------+ CCA Distal59      17                                intimal thickening +----------+--------+--------+--------+------------------+------------------+ ICA Prox  25      8               heterogenous                         +----------+--------+--------+--------+------------------+------------------+ ICA Distal58      17                                                   +----------+--------+--------+--------+------------------+------------------+ ECA       54      11              heterogenous                         +----------+--------+--------+--------+------------------+------------------+ +----------+--------+--------+----------------+-------------------+           PSV cm/sEDV cm/sDescribe        Arm Pressure (mmHG) +----------+--------+--------+----------------+-------------------+ Subclavian150             Multiphasic, WNL                    +----------+--------+--------+----------------+-------------------+ +---------+--------+--+--------+--+---------+  VertebralPSV cm/s44EDV cm/s16Antegrade +---------+--------+--+--------+--+---------+   Summary: Right Carotid: Velocities in the right ICA are consistent with a 1-39% stenosis. Left Carotid: Velocities in the left ICA are consistent with a 1-39% stenosis. Vertebrals:  Bilateral vertebral arteries demonstrate antegrade flow. Subclavians: Normal flow hemodynamics were seen in bilateral subclavian              arteries. *See table(s) above for measurements and observations.  Electronically signed by Delia Heady MD on 12/10/2021 at 1:12:16 PM.    Final    EEG adult  Result Date: 12/09/2021 Charlsie Quest, MD     12/09/2021  5:59 PM Patient Name: Deborah [name] MRN: [id number] Epilepsy  Attending: Charlsie Quest Referring Physician/Provider: Micki Riley, MD Date: 12/09/2021 Duration: 23.33 mins Patient history: 76 year old African-American lady with altered mental status without focal deficits of unclear etiology. EEG to evaluate for seizure Level of alertness: Awake, asleep AEDs during EEG study: None Technical aspects: This EEG study was done with scalp electrodes positioned according to the 10-20 International system of electrode placement. Electrical activity was acquired at a sampling rate of 500Hz  and reviewed with a high frequency filter of 70Hz  and a low frequency filter of 1Hz . EEG data were recorded continuously and digitally stored. Description: The posterior dominant rhythm consists 8 Hz activity of moderate voltage (25-35 uV) seen predominantly in posterior head regions, symmetric and reactive to eye opening and eye closing. Sleep was characterized by vertex waves, sleep spindles (12 to 14 Hz), maximal frontocentral region. Hyperventilation and photic stimulation were not performed.   IMPRESSION: This study is within normal limits. No seizures or epileptiform discharges were seen throughout the recording.   CT ANGIO HEAD NECK W WO CM  Result Date: 12/09/2021 CLINICAL DATA:  Stroke, follow-up. EXAM: CT ANGIOGRAPHY HEAD AND NECK TECHNIQUE: Multidetector CT imaging of the head and neck was performed using the standard protocol during bolus administration of intravenous contrast. Multiplanar CT image reconstructions and MIPs were obtained to evaluate the vascular anatomy. Carotid stenosis measurements (when applicable) are obtained utilizing NASCET criteria, using the distal internal carotid diameter as the denominator. RADIATION DOSE REDUCTION: This exam was performed according to the departmental dose-optimization program which includes automated exposure control, adjustment of the mA and/or kV according to patient size and/or use of iterative reconstruction  technique. CONTRAST:  83mL OMNIPAQUE IOHEXOL 350 MG/ML SOLN COMPARISON:  Brain MRI 12/09/2021.  Head CT 12/08/2021. FINDINGS: CT HEAD FINDINGS Brain: Mild generalized parenchymal atrophy. Known acute infarct within the left thalamus, similar in extent as compared to the brain MRI performed earlier today. Moderate patchy and ill-defined hypoattenuation within the cerebral white matter, nonspecific but compatible with chronic small vessel ischemic disease. There is no acute intracranial hemorrhage. No demarcated cortical infarct. No extra-axial fluid collection. No evidence of an intracranial mass. No midline shift. Vascular: No hyperdense vessel.  Atherosclerotic calcifications. Skull: No fracture or aggressive osseous lesion. Sinuses: Small-volume fluid scattered within the right ethmoid air cells. Minimal mucosal thickening within the anterior left ethmoid air cells. 5 mm osteoma within an anterior left ethmoid air cell. Orbits: Orbital mass or acute orbital finding. ASPECTS [facility] Stroke Program Early CT Score) - Ganglionic level infarction (caudate, lentiform nuclei, internal capsule, insula, M1-M3 cortex): 7 - Supraganglionic infarction (M4-M6 cortex): 3 Total score (0-10 with 10 being normal): 10 These results were communicated to Dr. 12/11/2021 at 3:19 pmon 6/19/2023by text page via the [facility] messaging system. Review of the MIP images confirms the above findings CTA  NECK FINDINGS Aortic arch: Standard aortic branching. Atherosclerotic plaque within the proximal major branch vessels of the neck. Streak and beam hardening artifact arising from a dense left-sided contrast bolus partially obscures the left subclavian artery. Within this limitation, there is no appreciable hemodynamically significant innominate or proximal subclavian artery stenosis. Right carotid system: CCA and ICA patent within the neck without hemodynamically stenosis (50% or greater). Mild atherosclerotic plaque about the carotid bifurcation and  within the proximal ICA. Left carotid system: CCA and ICA patent within the neck without hemodynamically significant stenosis (50% or greater). Mild atherosclerotic plaque about the carotid bifurcation and within the proximal ICA. Vertebral arteries: Vertebral arteries codominant and patent within the neck without stenosis. Skeleton: Straightening of the expected cervical lordosis. Vertebral ankylosis at C5-C6 and C6-C7. Cervical spondylosis. No acute fracture or aggressive osseous lesion. Other neck: No neck mass or cervical lymphadenopathy. Upper chest: No consolidation within the imaged lung apices. Review of the MIP images confirms the above findings CTA HEAD FINDINGS Anterior circulation: The intracranial internal carotid arteries are patent. Non-stenotic atherosclerotic plaque within both vessels. The M1 middle cerebral arteries are patent. No M2 proximal branch occlusion is identified. Severe stenosis within a proximal M2 right MCA vessel (series 17, image 18). The anterior cerebral arteries are patent. No intracranial aneurysm is identified. Posterior circulation: The intracranial vertebral arteries are patent. The basilar artery is patent. The posterior cerebral arteries are patent. Posterior communicating arteries are diminutive or absent, bilaterally. Venous sinuses: Within the limitations of contrast timing, no convincing thrombus. Anatomic variants: As described. Review of the MIP images confirms the above findings No emergent large vessel occlusion identified. This finding and CTA head impression #2 communicated to Dr. Leonie Man at 3:33 pm on 12/09/2021 by text page via the [facility] messaging system. IMPRESSION: CT head: 1. Known acute infarct within the left thalamus, similar in extent as compared to the brain MRI performed earlier today. 2. No acute intracranial hemorrhage or acute demarcated cortical infarction. 3. Moderate chronic small vessel ischemic changes within the cerebral white matter. 4. Mild  generalized parenchymal atrophy. 5. Paranasal sinus disease, as described. CTA neck: 1. The common carotid, internal carotid and vertebral arteries are patent within the neck without hemodynamically significant stenosis. Mild atherosclerotic plaque about the carotid bifurcations and within the proximal ICAs. 2.  Aortic Atherosclerosis (ICD10-I70.0). CTA head: 1. No intracranial large vessel occlusion is identified. 2. Severe stenosis within a proximal M2 right MCA vessel. 3. Non-stenotic atherosclerotic plaque within the intracranial internal carotid arteries, bilaterally. Electronically Signed   By: Kellie Simmering D.O.   On: 12/09/2021 15:35    Procedures Procedures  {Document cardiac monitor, telemetry assessment procedure when appropriate:1}  Medications Ordered in ED Medications  0.9 %  sodium chloride infusion (has no administration in time range)  heparin injection 5,000 Units (5,000 Units Subcutaneous Given 12/11/21 0612)  labetalol (NORMODYNE) injection 10-20 mg (20 mg Intravenous Given 12/09/21 0826)  hydrALAZINE (APRESOLINE) injection 10 mg (10 mg Intravenous Given 12/10/21 0907)  Chlorhexidine Gluconate Cloth 2 % PADS 6 each (6 each Topical Given 12/10/21 0907)   stroke: early stages of recovery book (has no administration in time range)  rosuvastatin (CRESTOR) tablet 10 mg (10 mg Oral Patient Refused/Not Given 12/10/21 1100)  aspirin suppository 300 mg ( Rectal See Alternative 12/10/21 1100)    Or  aspirin tablet 325 mg (325 mg Oral Patient Refused/Not Given 12/10/21 1100)  lisinopril (ZESTRIL) tablet 40 mg (40 mg Oral Patient Refused/Not Given 12/10/21 1100)  hydrochlorothiazide (  HYDRODIURIL) tablet 12.5 mg (12.5 mg Oral Patient Refused/Not Given 12/10/21 1100)  Oral care mouth rinse (has no administration in time range)  LORazepam (ATIVAN) injection 1 mg (has no administration in time range)  haloperidol lactate (HALDOL) injection 2 mg (2 mg Intravenous Given 12/10/21 1957)  thiamine (B-1)  injection 100 mg (100 mg Intravenous Given 12/08/21 1705)  lactated ringers bolus 1,000 mL (0 mLs Intravenous Stopped 12/08/21 1825)  LORazepam (ATIVAN) injection 1 mg (1 mg Intravenous Given 12/08/21 1756)  dextrose 50 % solution (50 mLs  Given 12/08/21 2041)  potassium chloride SA (KLOR-CON M) CR tablet 40 mEq (40 mEq Oral Given 12/09/21 1525)  iohexol (OMNIPAQUE) 350 MG/ML injection 100 mL (75 mLs Intravenous Contrast Given 12/09/21 1448)  sodium chloride 0.9 % bolus 1,000 mL (1,000 mLs Intravenous New Bag/Given 12/11/21 0023)    ED Course/ Medical Decision Making/ A&P                           Medical Decision Making Amount and/or Complexity of Data Reviewed Labs: ordered. Radiology: ordered.  Risk Prescription drug management. Decision regarding hospitalization.   ***  {Document critical care time when appropriate:1} {Document review of labs and clinical decision tools ie heart score, Chads2Vasc2 etc:1}  {Document your independent review of radiology images, and any outside records:1} {Document your discussion with family members, caretakers, and with consultants:1} {Document social determinants of health affecting pt's care:1} {Document your decision making why or why not admission, treatments were needed:1} Final Clinical Impression(s) / ED Diagnoses Final diagnoses:  None    Rx / DC Orders ED Discharge Orders     None

## 2021-12-11 NOTE — Progress Notes (Signed)
Patient with low BP overnight. Bp 60s/40s. Ns 1 liter given OTO.

## 2021-12-11 NOTE — Progress Notes (Signed)
Progress Note    Deborah [name]   [id number]:[id number]  DOB: 10/04/1945  DOA: 12/08/2021     3 PCP: Pcp, No  Initial CC: AMS  Hospital Course: Ms. Maiden is a 76 yo female with PMH anxiety, asthma, cocaine use, history of CVA, HTN, tobacco use, alcohol use who was brought to the hospital with altered mentation and found being down at a bus stop. Urine drug screen was recently positive for cocaine and benzos.  Initial CT head was concerning for PRES syndrome; she underwent MRI brain which showed acute infarct involving left thalamus and ruled out PRES. Due to ongoing altered mentation, she also underwent EEG which ruled out underlying seizure activity.  Interval History:  No events overnight. Still restrained in hands but  mentation a little better this morning. Possible trial off restraints to see how she does today.   Assessment and Plan: * Acute metabolic encephalopathy - seizure activity ruled out per neurology and with EEG - etiology presumed multifactorial from substance use, hypertensive encephalopathy, and CVA - continue PRN ativan or haldol for agitation - continue restraints if still needed -Mentation further improving today  Cerebrovascular accident (CVA) of left thalamus (HCC) - MRI on admission shows acute infarct involving left thalamus -Counseled on drug use cessation - Continue aspirin - Neurology following, appreciate assistance - Follow-up PT/OT eval's once mentation improves  Substance abuse (HCC) - UDS noted with cocaine on admission - TOC consult placed    Old records reviewed in assessment of this patient  Antimicrobials:   DVT prophylaxis:  heparin injection 5,000 Units Start: 12/08/21 2200   Code Status:   Code Status: Full Code  Disposition Plan: SNF Status is: Inpatient  Objective: Blood pressure 125/63, pulse 68, temperature 97.8 F (36.6 C), temperature source Oral, resp. rate 11, height 5\' 6"  (1.676 m), weight 52.2 kg, SpO2 99 %.   Examination:  Physical Exam Constitutional:      Comments: Thin, disheveled, and unkempt elderly woman lying in bed in no distress and is more interactive and talking some  HENT:     Head: Normocephalic and atraumatic.     Mouth/Throat:     Mouth: Mucous membranes are moist.  Eyes:     Comments: Pinpoint pupils bilaterally  Cardiovascular:     Rate and Rhythm: Normal rate and regular rhythm.  Pulmonary:     Effort: Pulmonary effort is normal.     Breath sounds: Normal breath sounds.  Abdominal:     General: Bowel sounds are normal. There is no distension.     Palpations: Abdomen is soft.  Musculoskeletal:        General: Normal range of motion.     Cervical back: Normal range of motion and neck supple.  Skin:    General: Skin is warm and dry.  Neurological:     Comments: Moves all 4 extremities and follows commands      Consultants:  Neurology  Procedures:    Data Reviewed: Results for orders placed or performed during the hospital encounter of 12/08/21 (from the past 24 hour(s))  Glucose, capillary     Status: None   Collection Time: 12/10/21 10:44 PM  Result Value Ref Range   Glucose-Capillary 88 70 - 99 mg/dL  Glucose, capillary     Status: Abnormal   Collection Time: 12/11/21 12:20 AM  Result Value Ref Range   Glucose-Capillary 149 (H) 70 - 99 mg/dL  Glucose, capillary     Status: None   Collection Time: 12/11/21  5:49 AM  Result Value Ref Range   Glucose-Capillary 82 70 - 99 mg/dL  Basic metabolic panel     Status: None   Collection Time: 12/11/21  6:32 AM  Result Value Ref Range   Sodium 142 135 - 145 mmol/L   Potassium 3.7 3.5 - 5.1 mmol/L   Chloride 108 98 - 111 mmol/L   CO2 24 22 - 32 mmol/L   Glucose, Bld 86 70 - 99 mg/dL   BUN 16 8 - 23 mg/dL   Creatinine, Ser 6.29 0.44 - 1.00 mg/dL   Calcium 9.6 8.9 - 52.8 mg/dL   GFR, Estimated >41 >32 mL/min   Anion gap 10 5 - 15  CBC with Differential/Platelet     Status: None   Collection Time: 12/11/21   6:32 AM  Result Value Ref Range   WBC 6.3 4.0 - 10.5 K/uL   RBC 4.32 3.87 - 5.11 MIL/uL   Hemoglobin 13.6 12.0 - 15.0 g/dL   HCT 44.0 10.2 - 72.5 %   MCV 94.7 80.0 - 100.0 fL   MCH 31.5 26.0 - 34.0 pg   MCHC 33.3 30.0 - 36.0 g/dL   RDW 36.6 44.0 - 34.7 %   Platelets 340 150 - 400 K/uL   nRBC 0.0 0.0 - 0.2 %   Neutrophils Relative % 67 %   Neutro Abs 4.2 1.7 - 7.7 K/uL   Lymphocytes Relative 26 %   Lymphs Abs 1.6 0.7 - 4.0 K/uL   Monocytes Relative 6 %   Monocytes Absolute 0.4 0.1 - 1.0 K/uL   Eosinophils Relative 1 %   Eosinophils Absolute 0.0 0.0 - 0.5 K/uL   Basophils Relative 0 %   Basophils Absolute 0.0 0.0 - 0.1 K/uL   Immature Granulocytes 0 %   Abs Immature Granulocytes 0.01 0.00 - 0.07 K/uL  Magnesium     Status: None   Collection Time: 12/11/21  6:32 AM  Result Value Ref Range   Magnesium 2.0 1.7 - 2.4 mg/dL  Glucose, capillary     Status: None   Collection Time: 12/11/21  8:08 AM  Result Value Ref Range   Glucose-Capillary 73 70 - 99 mg/dL  Glucose, capillary     Status: Abnormal   Collection Time: 12/11/21 11:02 AM  Result Value Ref Range   Glucose-Capillary 68 (L) 70 - 99 mg/dL  Glucose, capillary     Status: None   Collection Time: 12/11/21  3:11 PM  Result Value Ref Range   Glucose-Capillary 86 70 - 99 mg/dL    I have Reviewed nursing notes, Vitals, and Lab results since pt's last encounter. Pertinent lab results : see above I have ordered test including BMP, CBC, Mg I have reviewed the last note from staff over past 24 hours I have discussed pt's care plan and test results with nursing staff, case manager   LOS: 3 days   Lewie Chamber, MD [facility] 12/11/2021, 4:23 PM

## 2021-12-12 DIAGNOSIS — G9341 Metabolic encephalopathy: Secondary | ICD-10-CM | POA: Diagnosis not present

## 2021-12-12 DIAGNOSIS — I10 Essential (primary) hypertension: Secondary | ICD-10-CM

## 2021-12-12 DIAGNOSIS — I6381 Other cerebral infarction due to occlusion or stenosis of small artery: Secondary | ICD-10-CM | POA: Diagnosis not present

## 2021-12-12 LAB — CBC WITH DIFFERENTIAL/PLATELET
Abs Immature Granulocytes: 0.01 10*3/uL (ref 0.00–0.07)
Basophils Absolute: 0 10*3/uL (ref 0.0–0.1)
Basophils Relative: 0 %
Eosinophils Absolute: 0.1 10*3/uL (ref 0.0–0.5)
Eosinophils Relative: 2 %
HCT: 40.1 % (ref 36.0–46.0)
Hemoglobin: 13.3 g/dL (ref 12.0–15.0)
Immature Granulocytes: 0 %
Lymphocytes Relative: 26 %
Lymphs Abs: 1.3 10*3/uL (ref 0.7–4.0)
MCH: 31.4 pg (ref 26.0–34.0)
MCHC: 33.2 g/dL (ref 30.0–36.0)
MCV: 94.6 fL (ref 80.0–100.0)
Monocytes Absolute: 0.6 10*3/uL (ref 0.1–1.0)
Monocytes Relative: 12 %
Neutro Abs: 2.9 10*3/uL (ref 1.7–7.7)
Neutrophils Relative %: 60 %
Platelets: 328 10*3/uL (ref 150–400)
RBC: 4.24 MIL/uL (ref 3.87–5.11)
RDW: 13.8 % (ref 11.5–15.5)
WBC: 4.8 10*3/uL (ref 4.0–10.5)
nRBC: 0 % (ref 0.0–0.2)

## 2021-12-12 LAB — BASIC METABOLIC PANEL
Anion gap: 9 (ref 5–15)
BUN: 12 mg/dL (ref 8–23)
CO2: 24 mmol/L (ref 22–32)
Calcium: 9.7 mg/dL (ref 8.9–10.3)
Chloride: 108 mmol/L (ref 98–111)
Creatinine, Ser: 0.79 mg/dL (ref 0.44–1.00)
GFR, Estimated: >60 mL/min (ref >60)
Glucose, Bld: 100 mg/dL — ABNORMAL HIGH (ref 70–99)
Potassium: 3.9 mmol/L (ref 3.5–5.1)
Sodium: 141 mmol/L (ref 135–145)

## 2021-12-12 LAB — GLUCOSE, CAPILLARY
Glucose-Capillary: 104 mg/dL — ABNORMAL HIGH (ref 70–99)
Glucose-Capillary: 142 mg/dL — ABNORMAL HIGH (ref 70–99)
Glucose-Capillary: 126 mg/dL — ABNORMAL HIGH (ref 70–99)

## 2021-12-12 LAB — MAGNESIUM: Magnesium: 2.1 mg/dL (ref 1.7–2.4)

## 2021-12-12 MED ORDER — SODIUM CHLORIDE 0.9 % IV BOLUS
1000.0000 mL | Freq: Once | INTRAVENOUS | Status: AC
  Administered 2021-12-12: 1000 mL via INTRAVENOUS

## 2021-12-12 MED ORDER — HALOPERIDOL LACTATE 5 MG/ML IJ SOLN
2.0000 mg | Freq: Once | INTRAMUSCULAR | Status: AC
Start: 2021-12-12 — End: 2021-12-12
  Administered 2021-12-12: 2 mg via INTRAMUSCULAR

## 2021-12-12 MED ORDER — LORAZEPAM 2 MG/ML IJ SOLN
2.0000 mg | Freq: Once | INTRAMUSCULAR | Status: AC
Start: 2021-12-13 — End: 2021-12-12
  Administered 2021-12-12: 2 mg via INTRAVENOUS
  Filled 2021-12-12: qty 1

## 2021-12-12 NOTE — Assessment & Plan Note (Addendum)
-   BP elevated on admission not unexpected in setting of cocaine use and CVA - BP now becoming a little labile

## 2021-12-12 NOTE — Progress Notes (Signed)
Progress Note    Nil Bolser   [id number]  DOB: 09/21/1945  DOA: 12/08/2021     4 PCP: Pcp, No  Initial CC: AMS  Hospital Course: [name] is a 76 yo female with PMH anxiety, asthma, cocaine use, history of CVA, HTN, tobacco use, alcohol use who was brought to the hospital with altered mentation and found being down at a bus stop. Urine drug screen was recently positive for cocaine and benzos.  Initial CT head was concerning for PRES syndrome; she underwent MRI brain which showed acute infarct involving left thalamus and ruled out PRES. Due to ongoing altered mentation, she also underwent EEG which ruled out underlying seizure activity.  Interval History:  No events overnight. Was able to wake up further some today. This afternoon she had a visitor then she became hypotensive into the 70-80s / 40s-50s. Fluid bolus ordered.   Assessment and Plan: * Acute metabolic encephalopathy - seizure activity ruled out per neurology and with EEG - etiology presumed multifactorial from substance use, hypertensive encephalopathy, and CVA - continue PRN ativan or haldol for agitation - continue restraints if still needed -Mentation further improving today  Cerebrovascular accident (CVA) of left thalamus (HCC) - MRI on admission shows acute infarct involving left thalamus -Counseled on drug use cessation - Continue aspirin - Neurology following, appreciate assistance - Follow-up PT/OT eval's once mentation improves  Essential hypertension - BP elevated on admission not unexpected in setting of cocaine use and CVA - BP now becoming a little labile - hold BP meds at this time and will treat hypo/hypertension as needed  Substance abuse (HCC) - UDS noted with cocaine on admission - TOC consult placed    Old records reviewed in assessment of this patient  Antimicrobials:   DVT prophylaxis:     Code Status:   Code Status: Full Code  Disposition Plan: SNF Status is:  Inpatient  Objective: Blood pressure 130/72, pulse 100, temperature (!) 97.5 F (36.4 C), temperature source Axillary, resp. rate 17, height 5\' 6"  (1.676 m), weight 52.2 kg, SpO2 96 %.  Examination:  Physical Exam Constitutional:      Comments: Thin, disheveled, and unkempt elderly woman lying in bed in no distress. Less interactive this morning.   HENT:     Head: Normocephalic and atraumatic.     Mouth/Throat:     Mouth: Mucous membranes are moist.  Eyes:     Comments: Pinpoint pupils bilaterally  Cardiovascular:     Rate and Rhythm: Normal rate and regular rhythm.  Pulmonary:     Effort: Pulmonary effort is normal.     Breath sounds: Normal breath sounds.  Abdominal:     General: Bowel sounds are normal. There is no distension.     Palpations: Abdomen is soft.  Musculoskeletal:        General: Normal range of motion.     Cervical back: Normal range of motion and neck supple.  Skin:    General: Skin is warm and dry.  Neurological:     Comments: Moves all 4 extremities      Consultants:  Neurology  Procedures:    Data Reviewed: Results for orders placed or performed during the hospital encounter of 12/08/21 (from the past 24 hour(s))  Basic metabolic panel     Status: Abnormal   Collection Time: 12/12/21  5:45 AM  Result Value Ref Range   Sodium 141 135 - 145 mmol/L   Potassium 3.9 3.5 - 5.1 mmol/L   Chloride 108  98 - 111 mmol/L   CO2 24 22 - 32 mmol/L   Glucose, Bld 100 (H) 70 - 99 mg/dL   BUN 12 8 - 23 mg/dL   Creatinine, Ser 2.95 0.44 - 1.00 mg/dL   Calcium 9.7 8.9 - 18.8 mg/dL   GFR, Estimated >41 >66 mL/min   Anion gap 9 5 - 15  CBC with Differential/Platelet     Status: None   Collection Time: 12/12/21  5:45 AM  Result Value Ref Range   WBC 4.8 4.0 - 10.5 K/uL   RBC 4.24 3.87 - 5.11 MIL/uL   Hemoglobin 13.3 12.0 - 15.0 g/dL   HCT 06.3 01.6 - 01.0 %   MCV 94.6 80.0 - 100.0 fL   MCH 31.4 26.0 - 34.0 pg   MCHC 33.2 30.0 - 36.0 g/dL   RDW 93.2 35.5 -  73.2 %   Platelets 328 150 - 400 K/uL   nRBC 0.0 0.0 - 0.2 %   Neutrophils Relative % 60 %   Neutro Abs 2.9 1.7 - 7.7 K/uL   Lymphocytes Relative 26 %   Lymphs Abs 1.3 0.7 - 4.0 K/uL   Monocytes Relative 12 %   Monocytes Absolute 0.6 0.1 - 1.0 K/uL   Eosinophils Relative 2 %   Eosinophils Absolute 0.1 0.0 - 0.5 K/uL   Basophils Relative 0 %   Basophils Absolute 0.0 0.0 - 0.1 K/uL   Immature Granulocytes 0 %   Abs Immature Granulocytes 0.01 0.00 - 0.07 K/uL  Magnesium     Status: None   Collection Time: 12/12/21  5:45 AM  Result Value Ref Range   Magnesium 2.1 1.7 - 2.4 mg/dL  Glucose, capillary     Status: Abnormal   Collection Time: 12/12/21  7:43 AM  Result Value Ref Range   Glucose-Capillary 104 (H) 70 - 99 mg/dL  Glucose, capillary     Status: Abnormal   Collection Time: 12/12/21 11:31 AM  Result Value Ref Range   Glucose-Capillary 142 (H) 70 - 99 mg/dL  Glucose, capillary     Status: Abnormal   Collection Time: 12/12/21  3:14 PM  Result Value Ref Range   Glucose-Capillary 126 (H) 70 - 99 mg/dL    I have Reviewed nursing notes, Vitals, and Lab results since pt's last encounter. Pertinent lab results : see above I have ordered test including BMP, CBC, Mg I have reviewed the last note from staff over past 24 hours I have discussed pt's care plan and test results with nursing staff, case manager   LOS: 4 days   Lewie Chamber, MD [facility] 12/12/2021, 3:31 PM

## 2021-12-12 NOTE — Progress Notes (Signed)
Pt agitated and attempting to get up.  Pt medicated per MAR with no change.  Pt fighting staff and unable to follow any directions at this time.  MD made aware and orders placed. Will continue to monitor.   12/12/21 2022  Provider Notification  Provider Name/Title Imogene Burn  Date Provider Notified 12/12/21  Time Provider Notified 2022  Method of Notification Page  Notification Reason Other (Comment) (Pt agitated and trying to get out of bed, need wrist restraints)  Provider response See new orders  Date of Provider Response 12/12/21  Time of Provider Response 2023

## 2021-12-12 NOTE — Progress Notes (Signed)
   12/12/21 1509 12/12/21 1512 12/12/21 1516  Vitals  BP (!) 63/43 (!) 73/52 (!) 80/60  MAP (mmHg) (!) 50 (!) 61 69  ECG Heart Rate 64 60 63  Resp 20 18 18   Oxygen Therapy  SpO2 98 % 99 %  --     12/12/21 1518 12/12/21 1520  Vitals  BP 97/62 (!) 111/100  MAP (mmHg) 75 105  ECG Heart Rate 67 64  Resp 19 15  Oxygen Therapy  SpO2 99 % 99 %   Patient BP 60/40s at 1500; RN entered room to find patient diaphoretic and lethargic. MD D. Girguis paged. Order obtained for NS bolus. Patient responding to position change and stimulation. See Vitals for continued BP improvement. MD notified of improvement. Patient back to pre incident baseline.

## 2021-12-12 NOTE — Progress Notes (Signed)
Pt agitated and screaming out for random people.  Attempted to get patient's vitals, but pt fighting and cussing at this nurse.  Pt will not cooperate and thus vitals unable to be obtained at this time.  MD notified and orders placed.  Pt medicated per [facility] and will continue to monitor.  12/12/21 2315  Provider Notification  Provider Name/Title Imogene Burn  Date Provider Notified 12/12/21  Time Provider Notified 2316  Method of Notification Page  Notification Reason Other (Comment) (pt refusing vitals/fighting staff)  Provider response See new orders  Date of Provider Response 12/12/21  Time of Provider Response 2324

## 2021-12-12 NOTE — Progress Notes (Signed)
Report given to 3W Jamie. Patient transported to 3W08 with belonings. Friend Daryl updated on transfer.

## 2021-12-13 DIAGNOSIS — I6381 Other cerebral infarction due to occlusion or stenosis of small artery: Secondary | ICD-10-CM | POA: Diagnosis not present

## 2021-12-13 DIAGNOSIS — G9341 Metabolic encephalopathy: Secondary | ICD-10-CM | POA: Diagnosis not present

## 2021-12-13 LAB — BASIC METABOLIC PANEL
Anion gap: 13 (ref 5–15)
BUN: 20 mg/dL (ref 8–23)
CO2: 23 mmol/L (ref 22–32)
Calcium: 9.8 mg/dL (ref 8.9–10.3)
Chloride: 106 mmol/L (ref 98–111)
Creatinine, Ser: 1.06 mg/dL — ABNORMAL HIGH (ref 0.44–1.00)
GFR, Estimated: 54 mL/min — ABNORMAL LOW (ref >60)
Glucose, Bld: 97 mg/dL (ref 70–99)
Potassium: 3.8 mmol/L (ref 3.5–5.1)
Sodium: 142 mmol/L (ref 135–145)

## 2021-12-13 LAB — CBC WITH DIFFERENTIAL/PLATELET
Abs Immature Granulocytes: 0.02 10*3/uL (ref 0.00–0.07)
Basophils Absolute: 0 10*3/uL (ref 0.0–0.1)
Basophils Relative: 1 %
Eosinophils Absolute: 0.1 10*3/uL (ref 0.0–0.5)
Eosinophils Relative: 1 %
HCT: 39.6 % (ref 36.0–46.0)
Hemoglobin: 13.3 g/dL (ref 12.0–15.0)
Immature Granulocytes: 0 %
Lymphocytes Relative: 29 %
Lymphs Abs: 1.9 10*3/uL (ref 0.7–4.0)
MCH: 32 pg (ref 26.0–34.0)
MCHC: 33.6 g/dL (ref 30.0–36.0)
MCV: 95.4 fL (ref 80.0–100.0)
Monocytes Absolute: 0.6 10*3/uL (ref 0.1–1.0)
Monocytes Relative: 9 %
Neutro Abs: 3.9 10*3/uL (ref 1.7–7.7)
Neutrophils Relative %: 60 %
Platelets: 326 10*3/uL (ref 150–400)
RBC: 4.15 MIL/uL (ref 3.87–5.11)
RDW: 13.8 % (ref 11.5–15.5)
WBC: 6.4 10*3/uL (ref 4.0–10.5)
nRBC: 0 % (ref 0.0–0.2)

## 2021-12-13 LAB — MAGNESIUM: Magnesium: 2 mg/dL (ref 1.7–2.4)

## 2021-12-13 MED ORDER — HEPARIN SODIUM (PORCINE) 5000 UNIT/ML IJ SOLN
5000.0000 [IU] | Freq: Three times a day (TID) | INTRAMUSCULAR | Status: DC
  Administered 2021-12-13 – 2021-12-26 (×31): 5000 [IU] via SUBCUTANEOUS
  Filled 2021-12-13 (×32): qty 1

## 2021-12-13 MED ORDER — ZIPRASIDONE MESYLATE 20 MG IM SOLR
10.0000 mg | Freq: Once | INTRAMUSCULAR | Status: DC
Start: 2021-12-13 — End: 2021-12-16
  Filled 2021-12-13: qty 20

## 2021-12-13 MED ORDER — LISINOPRIL 20 MG PO TABS
20.0000 mg | ORAL_TABLET | Freq: Every day | ORAL | Status: DC
  Administered 2021-12-13 – 2021-12-27 (×14): 20 mg via ORAL
  Filled 2021-12-13 (×15): qty 1

## 2021-12-13 MED ORDER — LORAZEPAM 2 MG/ML IJ SOLN
2.0000 mg | Freq: Once | INTRAMUSCULAR | Status: AC
Start: 2021-12-13 — End: 2021-12-13
  Administered 2021-12-13: 2 mg via INTRAVENOUS
  Filled 2021-12-13: qty 1

## 2021-12-13 NOTE — Progress Notes (Signed)
Patient agitated, hallucinating, attempting to bite staff. 1 MG Ativan administered.   [facility] informed.

## 2021-12-14 DIAGNOSIS — F39 Unspecified mood [affective] disorder: Secondary | ICD-10-CM

## 2021-12-14 DIAGNOSIS — G9341 Metabolic encephalopathy: Secondary | ICD-10-CM | POA: Diagnosis not present

## 2021-12-14 LAB — CBC WITH DIFFERENTIAL/PLATELET
Abs Immature Granulocytes: 0.01 10*3/uL (ref 0.00–0.07)
Basophils Absolute: 0 10*3/uL (ref 0.0–0.1)
Basophils Relative: 1 %
Eosinophils Absolute: 0.2 10*3/uL (ref 0.0–0.5)
Eosinophils Relative: 4 %
HCT: 36.5 % (ref 36.0–46.0)
Hemoglobin: 12.3 g/dL (ref 12.0–15.0)
Immature Granulocytes: 0 %
Lymphocytes Relative: 41 %
Lymphs Abs: 2 10*3/uL (ref 0.7–4.0)
MCH: 32.2 pg (ref 26.0–34.0)
MCHC: 33.7 g/dL (ref 30.0–36.0)
MCV: 95.5 fL (ref 80.0–100.0)
Monocytes Absolute: 0.9 10*3/uL (ref 0.1–1.0)
Monocytes Relative: 17 %
Neutro Abs: 1.8 10*3/uL (ref 1.7–7.7)
Neutrophils Relative %: 37 %
Platelets: 286 10*3/uL (ref 150–400)
RBC: 3.82 MIL/uL — ABNORMAL LOW (ref 3.87–5.11)
RDW: 13.7 % (ref 11.5–15.5)
WBC: 5 10*3/uL (ref 4.0–10.5)
nRBC: 0 % (ref 0.0–0.2)

## 2021-12-14 LAB — BASIC METABOLIC PANEL
Anion gap: 10 (ref 5–15)
BUN: 27 mg/dL — ABNORMAL HIGH (ref 8–23)
CO2: 23 mmol/L (ref 22–32)
Calcium: 9.5 mg/dL (ref 8.9–10.3)
Chloride: 106 mmol/L (ref 98–111)
Creatinine, Ser: 1.06 mg/dL — ABNORMAL HIGH (ref 0.44–1.00)
GFR, Estimated: 54 mL/min — ABNORMAL LOW (ref >60)
Glucose, Bld: 90 mg/dL (ref 70–99)
Potassium: 4.1 mmol/L (ref 3.5–5.1)
Sodium: 139 mmol/L (ref 135–145)

## 2021-12-14 LAB — MAGNESIUM: Magnesium: 2.1 mg/dL (ref 1.7–2.4)

## 2021-12-14 MED ORDER — OLANZAPINE 2.5 MG PO TABS
5.0000 mg | ORAL_TABLET | Freq: Every evening | ORAL | Status: DC
  Administered 2021-12-14 – 2021-12-22 (×9): 5 mg via ORAL
  Filled 2021-12-14 (×8): qty 2

## 2021-12-14 NOTE — Progress Notes (Signed)
Progress Note    Deborah [name]   [id number]  DOB: [birth year]  DOA: 12/08/2021     6 PCP: Pcp, No  Initial CC: AMS  Hospital Course: Deborah [name] is a 76 yo female with PMH anxiety, asthma, cocaine use, history of CVA, HTN, tobacco use, alcohol use who was brought to the hospital with altered mentation and found being down at a bus stop. Urine drug screen was recently positive for cocaine and benzos.  Initial CT head was concerning for PRES syndrome; she underwent MRI brain which showed acute infarct involving left thalamus and ruled out PRES. Due to ongoing altered mentation, she also underwent EEG which ruled out underlying seizure activity.  Interval History:  Patient became agitated and unable to be redirected last night.  She was given Ativan as well as Geodon.  This morning she is lethargic/somnolent but answering some questions and is cooperative.  Being fed breakfast.  Assessment and Plan: * Acute metabolic encephalopathy - seizure activity ruled out per neurology and with EEG - etiology presumed multifactorial from substance use, hypertensive encephalopathy, and CVA - continue PRN ativan or haldol for agitation - continue restraints if still needed -Mentation further improving today  Cerebrovascular accident (CVA) of left thalamus (HCC) - MRI on admission shows acute infarct involving left thalamus -Counseled on drug use cessation - Continue aspirin - Neurology following, appreciate assistance - Follow-up PT/OT eval's once mentation improves  Essential hypertension - BP elevated on admission not unexpected in setting of cocaine use and CVA - BP now becoming a little labile  Mood disorder (HCC) - Patient becoming more agitated at night and difficulty with redirection; also she is combative towards staff trying to "bite" people - Geodon IM shot needed last night - Increase Zyprexa dose - use haldol or ativan PRN still   Substance abuse (HCC) - UDS noted with  cocaine on admission - TOC consult placed    Old records reviewed in assessment of this patient  Antimicrobials:   DVT prophylaxis:  heparin injection 5,000 Units Start: 12/13/21 1415HSQ   Code Status:   Code Status: Full Code  Disposition Plan: SNF Status is: Inpatient  Objective: Blood pressure (!) 92/54, pulse 66, temperature 98.5 F (36.9 C), temperature source Axillary, resp. rate 16, height 5\' 6"  (1.676 m), weight 52.2 kg, SpO2 98 %.  Examination:  Physical Exam Constitutional:      Comments: Thin, disheveled, and unkempt elderly woman lying in bed in no distress. Very soft spoken   HENT:     Head: Normocephalic and atraumatic.     Mouth/Throat:     Mouth: Mucous membranes are moist.  Eyes:     Comments: Pinpoint pupils bilaterally  Cardiovascular:     Rate and Rhythm: Normal rate and regular rhythm.  Pulmonary:     Effort: Pulmonary effort is normal.     Breath sounds: Normal breath sounds.  Abdominal:     General: Bowel sounds are normal. There is no distension.     Palpations: Abdomen is soft.  Musculoskeletal:        General: Normal range of motion.     Cervical back: Normal range of motion and neck supple.  Skin:    General: Skin is warm and dry.  Neurological:     Comments: Moves all 4 extremities      Consultants:  Neurology  Procedures:    Data Reviewed: Results for orders placed or performed during the hospital encounter of 12/08/21 (from the past 24 hour(s))  Basic metabolic panel     Status: Abnormal   Collection Time: 12/14/21  4:56 AM  Result Value Ref Range   Sodium 139 135 - 145 mmol/L   Potassium 4.1 3.5 - 5.1 mmol/L   Chloride 106 98 - 111 mmol/L   CO2 23 22 - 32 mmol/L   Glucose, Bld 90 70 - 99 mg/dL   BUN 27 (H) 8 - 23 mg/dL   Creatinine, Ser 1.61 (H) 0.44 - 1.00 mg/dL   Calcium 9.5 8.9 - 09.6 mg/dL   GFR, Estimated 54 (L) >60 mL/min   Anion gap 10 5 - 15  CBC with Differential/Platelet     Status: Abnormal   Collection  Time: 12/14/21  4:56 AM  Result Value Ref Range   WBC 5.0 4.0 - 10.5 K/uL   RBC 3.82 (L) 3.87 - 5.11 MIL/uL   Hemoglobin 12.3 12.0 - 15.0 g/dL   HCT 04.5 40.9 - 81.1 %   MCV 95.5 80.0 - 100.0 fL   MCH 32.2 26.0 - 34.0 pg   MCHC 33.7 30.0 - 36.0 g/dL   RDW 91.4 78.2 - 95.6 %   Platelets 286 150 - 400 K/uL   nRBC 0.0 0.0 - 0.2 %   Neutrophils Relative % 37 %   Neutro Abs 1.8 1.7 - 7.7 K/uL   Lymphocytes Relative 41 %   Lymphs Abs 2.0 0.7 - 4.0 K/uL   Monocytes Relative 17 %   Monocytes Absolute 0.9 0.1 - 1.0 K/uL   Eosinophils Relative 4 %   Eosinophils Absolute 0.2 0.0 - 0.5 K/uL   Basophils Relative 1 %   Basophils Absolute 0.0 0.0 - 0.1 K/uL   Immature Granulocytes 0 %   Abs Immature Granulocytes 0.01 0.00 - 0.07 K/uL  Magnesium     Status: None   Collection Time: 12/14/21  4:56 AM  Result Value Ref Range   Magnesium 2.1 1.7 - 2.4 mg/dL    I have Reviewed nursing notes, Vitals, and Lab results since pt's last encounter. Pertinent lab results : see above I have ordered test including BMP, CBC, Mg I have reviewed the last note from staff over past 24 hours I have discussed pt's care plan and test results with nursing staff, case manager   LOS: 6 days   Lewie Chamber, MD [facility] 12/14/2021, 1:37 PM

## 2021-12-14 NOTE — Assessment & Plan Note (Addendum)
-   Patient becoming more agitated at night and difficulty with redirection; also she is combative towards staff trying to "bite" people - Geodon IM shot needed last night - Increased Zyprexa dose - use haldol or ativan PRN still

## 2021-12-15 DIAGNOSIS — I6381 Other cerebral infarction due to occlusion or stenosis of small artery: Secondary | ICD-10-CM | POA: Diagnosis not present

## 2021-12-15 DIAGNOSIS — G9341 Metabolic encephalopathy: Secondary | ICD-10-CM | POA: Diagnosis not present

## 2021-12-15 MED ORDER — SODIUM CHLORIDE 0.9 % IV BOLUS
1000.0000 mL | Freq: Once | INTRAVENOUS | Status: AC
  Administered 2021-12-15: 1000 mL via INTRAVENOUS

## 2021-12-15 NOTE — Progress Notes (Signed)
Progress Note    Deborah [name]   [id number]  DOB: 02/23/1946  DOA: 12/08/2021     7 PCP: Pcp, No  Initial CC: AMS  Hospital Course: Ms. Cossairt is a 76 yo female with PMH anxiety, asthma, cocaine use, history of CVA, HTN, tobacco use, alcohol use who was brought to the hospital with altered mentation and found being down at a bus stop. Urine drug screen was recently positive for cocaine and benzos.  Initial CT head was concerning for PRES syndrome; she underwent MRI brain which showed acute infarct involving left thalamus and ruled out PRES. Due to ongoing altered mentation, she also underwent EEG which ruled out underlying seizure activity.  Interval History:  Blood pressure remains labile.  This morning she was again hypotensive and less responsive.  Fluid bolus given again, and blood pressure responded.  Assessment and Plan: * Acute metabolic encephalopathy - seizure activity ruled out per neurology and with EEG - etiology presumed multifactorial from substance use, hypertensive encephalopathy, and CVA - continue PRN ativan or haldol for agitation - continue restraints if still needed  Cerebrovascular accident (CVA) of left thalamus (HCC) - MRI on admission shows acute infarct involving left thalamus -Counseled on drug use cessation - Continue aspirin - Neurology evaluated   Essential hypertension - BP elevated on admission not unexpected in setting of cocaine use and CVA - BP now becoming a little labile  Mood disorder (HCC) - Patient becoming more agitated at night and difficulty with redirection; also she is combative towards staff trying to "bite" people - Geodon IM shot needed last night - Increase Zyprexa dose - use haldol or ativan PRN still   Substance abuse (HCC) - UDS noted with cocaine on admission - TOC consult placed    Old records reviewed in assessment of this patient  Antimicrobials:   DVT prophylaxis:  heparin injection 5,000 Units Start:  12/13/21 1415   Code Status:   Code Status: Full Code  Mobility Assessment (last 72 hours)     Mobility Assessment     Row Name 12/14/21 2000 12/14/21 0800 12/13/21 1959 12/13/21 1300 12/13/21 1050   Does patient have an order for bedrest or is patient medically unstable No - Continue assessment No - Continue assessment No - Continue assessment -- No - Continue assessment   What is the highest level of mobility based on the progressive mobility assessment? Level 2 (Chairfast) - Balance while sitting on edge of bed and cannot stand Level 2 (Chairfast) - Balance while sitting on edge of bed and cannot stand Level 2 (Chairfast) - Balance while sitting on edge of bed and cannot stand Level 2 (Chairfast) - Balance while sitting on edge of bed and cannot stand Level 2 (Chairfast) - Balance while sitting on edge of bed and cannot stand   Is the above level different from baseline mobility prior to current illness? Yes - Recommend PT order Yes - Recommend PT order Yes - Recommend PT order -- Yes - Recommend PT order    Row Name 12/12/21 1730           Does patient have an order for bedrest or is patient medically unstable No - Continue assessment       What is the highest level of mobility based on the progressive mobility assessment? Level 2 (Chairfast) - Balance while sitting on edge of bed and cannot stand       Is the above level different from baseline mobility prior to current illness?  Yes - Recommend PT order               Disposition Plan: SNF Status is: Inpatient  Objective: Blood pressure 139/69, pulse 72, temperature 97.7 F (36.5 C), temperature source Oral, resp. rate 15, height 5\' 6"  (1.676 m), weight 52.2 kg, SpO2 100 %.  Examination:  Physical Exam Constitutional:      Comments: Thin, disheveled, and unkempt elderly woman lying in bed in no distress. Very soft spoken   HENT:     Head: Normocephalic and atraumatic.     Mouth/Throat:     Mouth: Mucous membranes are  moist.  Eyes:     Comments: Pinpoint pupils bilaterally  Cardiovascular:     Rate and Rhythm: Normal rate and regular rhythm.  Pulmonary:     Effort: Pulmonary effort is normal.     Breath sounds: Normal breath sounds.  Abdominal:     General: Bowel sounds are normal. There is no distension.     Palpations: Abdomen is soft.  Musculoskeletal:        General: Normal range of motion.     Cervical back: Normal range of motion and neck supple.  Skin:    General: Skin is warm and dry.  Neurological:     Comments: Moves all 4 extremities      Consultants:  Neurology  Procedures:    Data Reviewed: No results found for this or any previous visit (from the past 24 hour(s)).   I have Reviewed nursing notes, Vitals, and Lab results since pt's last encounter. Pertinent lab results : see above I have reviewed the last note from staff over past 24 hours I have discussed pt's care plan and test results with nursing staff, case manager   LOS: 7 days   Lewie Chamber, MD [facility] 12/15/2021, 2:59 PM

## 2021-12-16 DIAGNOSIS — G9341 Metabolic encephalopathy: Secondary | ICD-10-CM | POA: Diagnosis not present

## 2021-12-16 DIAGNOSIS — I6381 Other cerebral infarction due to occlusion or stenosis of small artery: Secondary | ICD-10-CM | POA: Diagnosis not present

## 2021-12-16 NOTE — Progress Notes (Signed)
Speech Language Pathology Treatment: Cognitive-Linquistic  Patient Details Name: Deborah [name] MRN: [id number] DOB: 03/29/1946 Today's Date: 12/16/2021 Time: 7829-5621 SLP Time Calculation (min) (ACUTE ONLY): 15 min  Assessment / Plan / Recommendation Clinical Impression  Pt just finished walking with PT and was visiting with friend.  She was oriented to person, but not elements of time, place, or situation (when cued, able to state she had a "small stroke"). Required moderate assist to feed herself a honey-bun, part of which was still in its wrapper - she continued to attempt to bite the bun through the wrapper without recognition.  She needed physical cues to locate the bun when it would fall in her lap - otherwise she would pick at her lap without success.  Continues to demonstrated significant cognitive deficits with inattention, poor awareness of self and surroundings, and impaired short-term recall.  SLP will follow while admitted. Recommend 24 hour supervision/SNF upon D/C.    HPI HPI: Deborah [name] is a 76 yo female with PMH anxiety, asthma, cocaine use, history of CVA, HTN, tobacco use, alcohol use who was brought to the hospital with altered mentation and found being down at a bus stop.  Urine drug screen was recently positive for cocaine and benzos.  Initial CT head was concerning for PRES syndrome; she underwent MRI brain which showed acute infarct involving left thalamus and ruled out PRES.      SLP Plan  Continue with current plan of care      Recommendations for follow up therapy are one component of a multi-disciplinary discharge planning process, led by the attending physician.  Recommendations may be updated based on patient status, additional functional criteria and insurance authorization.    Recommendations                   Follow Up Recommendations: Skilled nursing-short term rehab (<3 hours/day) Assistance recommended at discharge: Frequent or constant  Supervision/Assistance SLP Visit Diagnosis: Cognitive communication deficit (H08.657) Plan: Continue with current plan of care        Cheyann Blecha L. Samson Frederic, MA CCC/SLP Clinical Specialist - Acute Care SLP Acute Rehabilitation Services Office number [phone]    Blenda Mounts Laurice  12/16/2021, 3:39 PM

## 2021-12-16 NOTE — Progress Notes (Signed)
Progress Note    Deborah [name]   RUE:[id number]  DOB: Feb 15, 1946  DOA: 12/08/2021     8 PCP: Pcp, No  Initial CC: AMS  Hospital Course: Deborah [name] is a 76 yo female with PMH anxiety, asthma, cocaine use, history of CVA, HTN, tobacco use, alcohol use who was brought to the hospital with altered mentation and found being down at a bus stop. Urine drug screen was recently positive for cocaine and benzos.  Initial CT head was concerning for PRES syndrome; she underwent MRI brain which showed acute infarct involving left thalamus and ruled out PRES. Due to ongoing altered mentation, she also underwent EEG which ruled out underlying seizure activity.  Interval History:  BP responded well to IVF bolus yesterday. This morning she's more awake and talking much more clearly. Restraints still in place.   Assessment and Plan: * Acute metabolic encephalopathy - seizure activity ruled out per neurology and with EEG - etiology presumed multifactorial from substance use, hypertensive encephalopathy, and CVA - continue PRN ativan or haldol for agitation - continue restraints if still needed -Mentation is a little more improved today, hoping that this continues  Cerebrovascular accident (CVA) of left thalamus (HCC) - MRI on admission shows acute infarct involving left thalamus -Counseled on drug use cessation - Continue aspirin - Neurology evaluated   Essential hypertension - BP elevated on admission not unexpected in setting of cocaine use and CVA - BP now becoming a little labile  Mood disorder (HCC) - Patient becoming more agitated at night and difficulty with redirection; also she is combative towards staff trying to "bite" people - Geodon IM shot needed last night - Increased Zyprexa dose - use haldol or ativan PRN still   Substance abuse (HCC) - UDS noted with cocaine on admission - TOC consult placed    Old records reviewed in assessment of this  patient  Antimicrobials:   DVT prophylaxis:  heparin injection 5,000 Units Start: 12/13/21 1415   Code Status:   Code Status: Full Code  Mobility Assessment (last 72 hours)     Mobility Assessment     Row Name 12/15/21 1600 12/15/21 1200 12/15/21 1000 12/14/21 2000 12/14/21 0800   Does patient have an order for bedrest or is patient medically unstable No - Continue assessment No - Continue assessment No - Continue assessment No - Continue assessment No - Continue assessment   What is the highest level of mobility based on the progressive mobility assessment? Level 2 (Chairfast) - Balance while sitting on edge of bed and cannot stand Level 2 (Chairfast) - Balance while sitting on edge of bed and cannot stand Level 2 (Chairfast) - Balance while sitting on edge of bed and cannot stand Level 2 (Chairfast) - Balance while sitting on edge of bed and cannot stand Level 2 (Chairfast) - Balance while sitting on edge of bed and cannot stand   Is the above level different from baseline mobility prior to current illness? Yes - Recommend PT order Yes - Recommend PT order Yes - Recommend PT order Yes - Recommend PT order Yes - Recommend PT order    Row Name 12/13/21 1959 12/13/21 1300         Does patient have an order for bedrest or is patient medically unstable No - Continue assessment --      What is the highest level of mobility based on the progressive mobility assessment? Level 2 (Chairfast) - Balance while sitting on edge of bed and cannot stand Level  2 (Chairfast) - Balance while sitting on edge of bed and cannot stand      Is the above level different from baseline mobility prior to current illness? Yes - Recommend PT order --              Disposition Plan: SNF Status is: Inpatient  Objective: Blood pressure 140/62, pulse 70, temperature 98.1 F (36.7 C), temperature source Oral, resp. rate 16, height 5\' 6"  (1.676 m), weight 52.2 kg, SpO2 100 %.  Examination:  Physical  Exam Constitutional:      Comments: Thin, disheveled, and unkempt elderly woman lying in bed in no distress. Very soft spoken   HENT:     Head: Normocephalic and atraumatic.     Mouth/Throat:     Mouth: Mucous membranes are moist.  Eyes:     Comments: Pinpoint pupils bilaterally  Cardiovascular:     Rate and Rhythm: Normal rate and regular rhythm.  Pulmonary:     Effort: Pulmonary effort is normal.     Breath sounds: Normal breath sounds.  Abdominal:     General: Bowel sounds are normal. There is no distension.     Palpations: Abdomen is soft.  Musculoskeletal:        General: Normal range of motion.     Cervical back: Normal range of motion and neck supple.  Skin:    General: Skin is warm and dry.  Neurological:     Comments: Moves all 4 extremities      Consultants:  Neurology  Procedures:    Data Reviewed: No results found for this or any previous visit (from the past 24 hour(s)).   I have Reviewed nursing notes, Vitals, and Lab results since pt's last encounter. Pertinent lab results : see above I have reviewed the last note from staff over past 24 hours I have discussed pt's care plan and test results with nursing staff, case manager   LOS: 8 days   Lewie Chamber, MD [facility] 12/16/2021, 12:43 PM

## 2021-12-17 DIAGNOSIS — G9341 Metabolic encephalopathy: Secondary | ICD-10-CM | POA: Diagnosis not present

## 2021-12-17 MED ORDER — AMLODIPINE BESYLATE 5 MG PO TABS
5.0000 mg | ORAL_TABLET | Freq: Every day | ORAL | Status: DC
Start: 2021-12-17 — End: 2021-12-27
  Administered 2021-12-17 – 2021-12-27 (×11): 5 mg via ORAL
  Filled 2021-12-17 (×12): qty 1

## 2021-12-17 NOTE — Progress Notes (Addendum)
Physical Therapy Treatment Patient Details Name: Deborah [name] MRN: [id number] DOB: June 05, 1946 Today's Date: 12/17/2021   History of Present Illness Pt is a 76 y.o. F who was found down at the bus stop and admitted 12/08/2021. MRI showing small left thalamic stroke. Significant PMH: alcohol and cocaine abuse, CVA, HTN.    PT Comments    Pt is slow to progress, limited by lethargy, poor motor planning/?apraxia.   Emphasis on warm up before mobility for increased arousal, transition to EOB, sitting balance, sit to standing/safety and sitting task including eating/coordination.   Recommendations for follow up therapy are one component of a multi-disciplinary discharge planning process, led by the attending physician.  Recommendations may be updated based on patient status, additional functional criteria and insurance authorization.  Follow Up Recommendations  Skilled nursing-short term rehab (<3 hours/day) Can patient physically be transported by private vehicle: Yes   Assistance Recommended at Discharge Frequent or constant Supervision/Assistance  Patient can return home with the following A little help with walking and/or transfers;A little help with bathing/dressing/bathroom;Assistance with cooking/housework;Direct supervision/assist for medications management;Assist for transportation;Help with stairs or ramp for entrance   Equipment Recommendations  None recommended by PT    Recommendations for Other Services       Precautions / Restrictions Precautions Precautions: Fall Precaution Comments: posey belt and wrist restraints Restrictions Weight Bearing Restrictions: No     Mobility  Bed Mobility Overal bed mobility: Needs Assistance Bed Mobility: Supine to Sit, Sit to Supine     Supine to sit: Total assist, +2 for physical assistance, +2 for safety/equipment Sit to supine: Total assist, +2 for physical assistance, +2 for safety/equipment   General bed mobility comments: to  sit EOB requires use of pad to pivot due to lethargy,  when returning back to supine pt unable to follow commands to lay down therefore required total assist +2.  Once supine, pt able to sit in long sitting with supervision.    Transfers Overall transfer level: Needs assistance Equipment used: Rolling walker (2 wheels) Transfers: Sit to/from Stand Sit to Stand: Mod assist, +2 physical assistance, +2 safety/equipment           General transfer comment: face to face assist to help forward and for boost, some guarding needed as pt was not able to focus well on task.  Stood long enough for peri care due to incontinence of urine.    Ambulation/Gait               General Gait Details: Not safe to try today.   Stairs             Wheelchair Mobility    Modified Rankin (Stroke Patients Only) Modified Rankin (Stroke Patients Only) Pre-Morbid Rankin Score: No symptoms Modified Rankin: Moderately severe disability     Balance Overall balance assessment: Needs assistance Sitting-balance support: Feet supported, No upper extremity supported Sitting balance-Leahy Scale: Poor     Standing balance support: No upper extremity supported, During functional activity Standing balance-Leahy Scale: Poor Standing balance comment: relies on external support                            Cognition Arousal/Alertness: Lethargic, Awake/alert Behavior During Therapy: Restless, Impulsive Overall Cognitive Status: Impaired/Different from baseline Area of Impairment: Following commands, Memory, Safety/judgement, Awareness, Problem solving, Attention                   Current Attention Level: Focused Memory: Decreased  short-term memory, Decreased recall of precautions Following Commands: Follows one step commands inconsistently, Follows one step commands with increased time Safety/Judgement: Decreased awareness of safety, Decreased awareness of deficits Awareness:  Intellectual Problem Solving: Difficulty sequencing, Requires verbal cues, Slow processing, Decreased initiation, Requires tactile cues General Comments: orientation not assessed today, asleep upon entry and requires increased time for alertness even initally when assisted to upright sitting.  She demonstrates very poor initation, sequencing and problem sovling with basic self care tasks (trying to compelte pericare over her gown, eating without scooping food with spoon) and requires maximal verbal cueing.        Exercises Other Exercises Other Exercises: Warm up ROM to bil LE's/UE's while attempt to improve arousal level.    General Comments General comments (skin integrity, edema, etc.): sat EOB for 15+ min working on sitting balance/trunk control with work on coordination of eating with R UE while focusing on tasking      Pertinent Vitals/Pain Pain Assessment Pain Assessment: No/denies pain Pain Intervention(s): Monitored during session    Home Living                          Prior Function            PT Goals (current goals can now be found in the care plan section) Acute Rehab PT Goals PT Goal Formulation: With patient Time For Goal Achievement: 12/23/21 Potential to Achieve Goals: Fair Progress towards PT goals: Not progressing toward goals - comment (Unable to participate/focus on session.)    Frequency    Min 3X/week      PT Plan Current plan remains appropriate    Co-evaluation PT/OT/SLP Co-Evaluation/Treatment: Yes Reason for Co-Treatment: For patient/therapist safety PT goals addressed during session: Mobility/safety with mobility OT goals addressed during session: ADL's and self-care      AM-PAC PT "6 Clicks" Mobility   Outcome Measure  Help needed turning from your back to your side while in a flat bed without using bedrails?: Total Help needed moving from lying on your back to sitting on the side of a flat bed without using bedrails?:  Total Help needed moving to and from a bed to a chair (including a wheelchair)?: Total Help needed standing up from a chair using your arms (e.g., wheelchair or bedside chair)?: Total Help needed to walk in hospital room?: Total Help needed climbing 3-5 steps with a railing? : Total 6 Click Score: 6    End of Session   Activity Tolerance: Patient tolerated treatment well;Patient limited by lethargy Patient left: in bed;with call bell/phone within reach;with restraints reapplied;with bed alarm set;with family/visitor present Nurse Communication: Mobility status PT Visit Diagnosis: Other abnormalities of gait and mobility (R26.89);Other symptoms and signs involving the nervous system (R29.898)     Time: 1478-2956 PT Time Calculation (min) (ACUTE ONLY): 31 min  Charges:  $Therapeutic Activity: 8-22 mins                     12/17/2021  Jacinto Halim., PT Acute Rehabilitation Services [phone]  (pager) [phone]  (office)   Eliseo Gum Deborah [name] 12/17/2021, 4:15 PM

## 2021-12-18 DIAGNOSIS — G9341 Metabolic encephalopathy: Secondary | ICD-10-CM | POA: Diagnosis not present

## 2021-12-18 NOTE — Progress Notes (Signed)
PROGRESS NOTE    Deborah [name]  JTT:[id number] DOB: 06/08/1946 DOA: 12/08/2021 PCP: Pcp, No   Brief Narrative:  Deborah [name] is a 76 yo female with PMH anxiety, asthma, cocaine use, history of CVA, HTN, tobacco use, alcohol use who was brought to the hospital with altered mentation and found being down at a bus stop. Urine drug screen was recently positive for cocaine and benzos.  Initial CT head was concerning for PRES syndrome; she underwent MRI brain which showed acute infarct involving left thalamus and ruled out PRES. Due to ongoing altered mentation, she also underwent EEG which ruled out underlying seizure activity.  Assessment & Plan:   Principal Problem:   Acute metabolic encephalopathy Active Problems:   Cerebrovascular accident (CVA) of left thalamus (HCC)   Essential hypertension   Substance abuse (HCC)   Mood disorder (HCC)   Acute metabolic encephalopathy, ongoing - seizure activity ruled out per neurology and with EEG - etiology presumed multifactorial from substance use, hypertensive encephalopathy, and CVA - continue PRN ativan or haldol for agitation - continue restraints if still needed - This morning patient awoke oriented to person only but noncombative and cooperative; restraints were removed, unfortunately patient became uncooperative, unable to be reoriented or distracted ultimately became combative requiring replacement of restraints.   Cerebrovascular accident (CVA) of left thalamus (HCC) - MRI on admission shows acute infarct involving left thalamus -Counseled on drug use cessation - Continue aspirin - Neurology evaluated    Essential hypertension - Add amlodipine - not well controlled on lisinopril and PRN labetolol/hydralazine   Mood disorder (HCC) - Patient becoming more agitated at night and difficulty with redirection; also she is combative towards staff trying to "bite" people - Geodon IM shot needed last night - Increased Zyprexa dose - use  haldol or ativan PRN still    Substance abuse (HCC) - UDS noted with cocaine on admission - TOC consult placed  DVT prophylaxis: Heparin Code Status: Full Family Communication: None present; friend is only point of contact - unavailable today  Status is: Inpatient  Dispo: The patient is from: Home              Anticipated d/c is to: To be determined              Anticipated d/c date is: 48 to 72 hours              Patient currently not medically stable for discharge  Consultants:  Neurology  Procedures:  None  Antimicrobials:  None  Subjective: No acute issues or events overnight, review of systems limited, patient oriented to person only, thinks we are currently located at her home, Deborah [name] Hence is president and is September 1978.  Otherwise able to follow commands  Objective: Vitals:   12/17/21 1539 12/17/21 1947 12/17/21 2304 12/18/21 0313  BP: (!) 165/86 139/66 (!) 155/76 (!) 160/98  Pulse: 72 68 (!) 58 75  Resp: 19 17 17 16   Temp: 97.7 F (36.5 C) 98 F (36.7 C) (!) 97.3 F (36.3 C) (!) 97.5 F (36.4 C)  TempSrc: Oral Oral Oral Oral  SpO2: 100% 100% 100% 98%  Weight:      Height:        Intake/Output Summary (Last 24 hours) at 12/18/2021 0754 Last data filed at 12/17/2021 1700 Gross per 24 hour  Intake 600 ml  Output --  Net 600 ml    Filed Weights   12/09/21 0500 12/10/21 0500 12/11/21 0500  Weight: 56.2 kg 55.2  kg 52.2 kg    Examination:  General:  Pleasantly resting in bed, No acute distress. HEENT:  Normocephalic atraumatic.  Sclerae nonicteric, noninjected.  Extraocular movements intact bilaterally. Neck:  Without mass or deformity.  Trachea is midline. Lungs:  Clear to auscultate bilaterally without rhonchi, wheeze, or rales. Heart:  Regular rate and rhythm.  Without murmurs, rubs, or gallops. Abdomen:  Soft, nontender, nondistended.  Without guarding or rebound. Extremities: Without cyanosis, clubbing, edema, or obvious deformity. Vascular:   Dorsalis pedis and posterior tibial pulses palpable bilaterally. Skin:  Warm and dry, no erythema, no ulcerations.   Data Reviewed: I have personally reviewed following labs and imaging studies  CBC: Recent Labs  Lab 12/12/21 0545 12/13/21 0033 12/14/21 0456  WBC 4.8 6.4 5.0  NEUTROABS 2.9 3.9 1.8  HGB 13.3 13.3 12.3  HCT 40.1 39.6 36.5  MCV 94.6 95.4 95.5  PLT 328 326 286    Basic Metabolic Panel: Recent Labs  Lab 12/12/21 0545 12/13/21 0033 12/14/21 0456  NA 141 142 139  K 3.9 3.8 4.1  CL 108 106 106  CO2 24 23 23   GLUCOSE 100* 97 90  BUN 12 20 27*  CREATININE 0.79 1.06* 1.06*  CALCIUM 9.7 9.8 9.5  MG 2.1 2.0 2.1    GFR: Estimated Creatinine Clearance: 36.6 mL/min (A) (by C-G formula based on SCr of 1.06 mg/dL (H)). Liver Function Tests: No results for input(s): "AST", "ALT", "ALKPHOS", "BILITOT", "PROT", "ALBUMIN" in the last 168 hours. No results for input(s): "LIPASE", "AMYLASE" in the last 168 hours. No results for input(s): "AMMONIA" in the last 168 hours. Coagulation Profile: No results for input(s): "INR", "PROTIME" in the last 168 hours. Cardiac Enzymes: No results for input(s): "CKTOTAL", "CKMB", "CKMBINDEX", "TROPONINI" in the last 168 hours. BNP (last 3 results) No results for input(s): "PROBNP" in the last 8760 hours. HbA1C: No results for input(s): "HGBA1C" in the last 72 hours. CBG: Recent Labs  Lab 12/11/21 1102 12/11/21 1511 12/12/21 0743 12/12/21 1131 12/12/21 1514  GLUCAP 68* 86 104* 142* 126*    Lipid Profile: No results for input(s): "CHOL", "HDL", "LDLCALC", "TRIG", "CHOLHDL", "LDLDIRECT" in the last 72 hours. Thyroid Function Tests: No results for input(s): "TSH", "T4TOTAL", "FREET4", "T3FREE", "THYROIDAB" in the last 72 hours. Anemia Panel: No results for input(s): "VITAMINB12", "FOLATE", "FERRITIN", "TIBC", "IRON", "RETICCTPCT" in the last 72 hours. Sepsis Labs: No results for input(s): "PROCALCITON", "LATICACIDVEN" in the  last 168 hours.  Recent Results (from the past 240 hour(s))  MRSA Next Gen by PCR, Nasal     Status: None   Collection Time: 12/08/21  8:51 PM   Specimen: Nasal Mucosa; Nasal Swab  Result Value Ref Range Status   MRSA by PCR Next Gen NOT DETECTED NOT DETECTED Final    Comment: (NOTE) The GeneXpert MRSA Assay (FDA approved for NASAL specimens only), is one component of a comprehensive MRSA colonization surveillance program. It is not intended to diagnose MRSA infection nor to guide or monitor treatment for MRSA infections. Test performance is not FDA approved in patients less than 40 years old. Performed at [facility], [address] [place], [place]     Radiology Studies: No results found.  Scheduled Meds:  amLODipine  5 mg Oral Daily   aspirin  300 mg Rectal Daily   Or   aspirin  325 mg Oral Daily   FLUoxetine  20 mg Oral Daily   heparin  5,000 Units Subcutaneous Q8H   lisinopril  20  mg Oral Daily   OLANZapine  5 mg Oral QPM   rosuvastatin  10 mg Oral Daily   Continuous Infusions:  sodium chloride       LOS: 10 days   Time spent:  Azucena Fallen, DO [facility]  If 7PM-7AM, please contact night-coverage [email or web address]  12/18/2021, 7:54 AM

## 2021-12-18 NOTE — TOC Progression Note (Signed)
Transition of Care [facility]) - Progression Note    Patient Details  Name: Deborah [name] MRN: [id number] Date of Birth: Nov 13, 1945  Transition of [facility] CM/SW Contact  Baldemar Lenis, [place] Phone Number: 12/18/2021, 3:39 PM  Clinical Narrative:   CSW spoke with patient's friend, Darryl, about bed offer at [facility] for SNF, and Rachael Fee is in agreement. CSW discussed with Darryl about signing her into SNF when she's ready, and he would be agreeable to that if he's in town; he will be going out of town next week for the holiday, but he thinks the patient has a niece who will be in town while he's gone. CSW unsure of discharge date as patient still with sitter and restraints, will continue to follow.    Expected Discharge Plan: Skilled Nursing Facility Barriers to Discharge: Continued Medical Work up  Expected Discharge Plan and Services Expected Discharge Plan: Skilled Nursing Facility In-house Referral: Clinical Social Work Discharge Planning Services: CM Consult   Living arrangements for the past 2 months: Single Family Home                                       Social Determinants of Health (SDOH) Interventions    Readmission Risk Interventions     No data to display

## 2021-12-19 DIAGNOSIS — G9341 Metabolic encephalopathy: Secondary | ICD-10-CM | POA: Diagnosis not present

## 2021-12-19 NOTE — Progress Notes (Addendum)
Occupational Therapy Treatment Patient Details Name: Deborah [name] MRN: [id number] DOB: [birth year] Today's Date: 12/19/2021   History of present illness Pt is a 76 y.o. F who [place] at [place] bus stop and admitted 12/08/2021. MRI showing small left thalamic stroke. Significant PMH: alcohol and cocaine abuse, CVA, HTN.   OT comments  Pt supine in bed and agreeable to OT/PT session.  Pt able to don socks with min assist, but requires up to max assist for standing balance to complete LB dressing.  She transfers with min assist +2 safety but for balance during ADLs and mobility requires up to max assist +2. She is following more commands today, but remains confused --oriented to self only. Poor awareness, problem solving and safety requiring max cueing and redirection throughout session. Requires max cueing to sequence simple hand washing task at sink. Able to read room numbers with increased time, but continues to demonstrate difficulty with depth perception and voicing "I just can't see" when trying to reach the paper towels at this sink.  Vision remains difficult to assess. Will follow acutely.    Recommendations for follow up therapy are one component of a multi-disciplinary discharge planning process, led by the attending physician.  Recommendations may be updated based on patient status, additional functional criteria and insurance authorization.    Follow Up Recommendations  Skilled nursing-short term rehab (<3 hours/day)    Assistance Recommended at Discharge Frequent or constant Supervision/Assistance  Patient can return home with the following  A lot of help with walking and/or transfers;A lot of help with bathing/dressing/bathroom;Assistance with cooking/housework;Direct supervision/assist for medications management;Assist for transportation;Help with stairs or ramp for entrance   Equipment Recommendations  Other (comment) (defer)    Recommendations for Other Services       Precautions / Restrictions Precautions Precautions: Fall Precaution Comments: posey belt and wrist restraints Restrictions Weight Bearing Restrictions: No       Mobility Bed Mobility Overal bed mobility: Needs Assistance Bed Mobility: Supine to Sit, Sit to Supine     Supine to sit: Min assist, +2 for safety/equipment Sit to supine: Mod assist, +2 for safety/equipment   General bed mobility comments: trunk support to ascend, B LE support to return supine; max verbal cues    Transfers Overall transfer level: Needs assistance Equipment used: Rolling walker (2 wheels), None Transfers: Sit to/from Stand Sit to Stand: Min assist, +2 safety/equipment           General transfer comment: min assist +2 safety to ascend with legs braced on bed, up to max assist to maintain balance in standing with heavy posterior lean     Balance Overall balance assessment: Needs assistance Sitting-balance support: No upper extremity supported, Feet supported Sitting balance-Leahy Scale: Poor     Standing balance support: No upper extremity supported, Bilateral upper extremity supported, During functional activity Standing balance-Leahy Scale: Poor Standing balance comment: relies on external support                           ADL either performed or assessed with clinical judgement   ADL Overall ADL's : Needs assistance/impaired     Grooming: Maximal assistance;Wash/dry hands;Standing Grooming Details (indicate cue type and reason): washing hands at sink.  Requires assist to sequence task, cueing to locate items at sink and maintain balance             Lower Body Dressing: Maximal assistance;+2 for safety/equipment;Sit to/from stand Lower Body Dressing Details (  indicate cue type and reason): able to don socks in long sitting with min assist, +2 in standing for balance Toilet Transfer: Maximal assistance;+2 for physical assistance;+2 for safety/equipment;Ambulation Toilet  Transfer Details (indicate cue type and reason): for balance and safety Toileting- Clothing Manipulation and Hygiene: Maximal assistance;+2 for safety/equipment;Sit to/from stand Toileting - Clothing Manipulation Details (indicate cue type and reason): for balance and safety, assist for clothing mgmt and sequencing     Functional mobility during ADLs: Maximal assistance;+2 for physical assistance;+2 for safety/equipment General ADL Comments: up to max assist +2 for balance and functional mobility, constant redirection to task, cueing for safety    Extremity/Trunk Assessment              Vision   Additional Comments: continue assessment- demonstrates difficulty with depth perecption, over and undershooting when reaching for items at sink.  Can read room numbers in hallway with increased time.   Perception     Praxis      Cognition Arousal/Alertness: Awake/alert Behavior During Therapy: Restless, Impulsive Overall Cognitive Status: Impaired/Different from baseline Area of Impairment: Orientation, Attention, Memory, Following commands, Safety/judgement, Awareness, Problem solving                 Orientation Level: Disoriented to, Place, Time, Situation Current Attention Level: Focused Memory: Decreased short-term memory, Decreased recall of precautions Following Commands: Follows one step commands inconsistently, Follows one step commands with increased time Safety/Judgement: Decreased awareness of safety, Decreased awareness of deficits Awareness: Intellectual Problem Solving: Slow processing, Decreased initiation, Difficulty sequencing, Requires tactile cues, Requires verbal cues General Comments: pt perseverating on her "money" (a piece of paper with phone # on it), pleasantly confused throughout session.  following approx 50% of commands with increased time and multimodal cueing. Requires max cueing for safety and problem solving.        Exercises      Shoulder  Instructions       General Comments      Pertinent Vitals/ Pain       Pain Assessment Pain Assessment: Faces Faces Pain Scale: No hurt  Home Living                                          Prior Functioning/Environment              Frequency  Min 2X/week        Progress Toward Goals  OT Goals(current goals can now be found in the care plan section)  Progress towards OT goals: Progressing toward goals  Acute Rehab OT Goals Patient Stated Goal: none stated Time For Goal Achievement: 12/31/21 Potential to Achieve Goals: Good  Plan Discharge plan remains appropriate;Frequency remains appropriate    Co-evaluation    PT/OT/SLP Co-Evaluation/Treatment: Yes Reason for Co-Treatment: Necessary to address cognition/behavior during functional activity;For patient/therapist safety;To address functional/ADL transfers   OT goals addressed during session: ADL's and self-care      AM-PAC OT "6 Clicks" Daily Activity     Outcome Measure   Help from another person eating meals?: A Lot Help from another person taking care of personal grooming?: A Lot Help from another person toileting, which includes using toliet, bedpan, or urinal?: Total Help from another person bathing (including washing, rinsing, drying)?: A Lot Help from another person to put on and taking off regular upper body clothing?: A Lot Help from another person to put on  and taking off regular lower body clothing?: Total 6 Click Score: 10    End of Session Equipment Utilized During Treatment: Gait belt;Rolling walker (2 wheels)  OT Visit Diagnosis: Unsteadiness on feet (R26.81);Other abnormalities of gait and mobility (R26.89);Muscle weakness (generalized) (M62.81)   Activity Tolerance Patient tolerated treatment well   Patient Left in bed;with call bell/phone within reach;with bed alarm set;with restraints reapplied;with nursing/sitter in room   Nurse Communication Mobility status         Time: 3474-2595 OT Time Calculation (min): 23 min  Charges: OT General Charges $OT Visit: 1 Visit OT Treatments $Self Care/Home Management : 8-22 mins  Barry Brunner, OT Acute Rehabilitation Services Office [phone]   Chancy Milroy 12/19/2021, 1:45 PM

## 2021-12-19 NOTE — Progress Notes (Signed)
Physical Therapy Treatment Patient Details Name: Deborah [name] MRN: [id number] DOB: 1945-07-16 Today's Date: 12/19/2021   History of Present Illness Pt is a 76 y.o. F who was found down at the bus stop and admitted 12/08/2021. MRI showing small left thalamic stroke. Significant PMH: alcohol and cocaine abuse, CVA, HTN.    PT Comments    Pt progressing slowly towards physical therapy goals. Cognition continues to be the main limiting factor, requiring increased cues for problem solving, sequencing, and awareness to deficits/safety. Pt continues to require +2 assist mainly for safety, and up to max assist for balance support and safety as pt is impulsive and aggressive at times. Will continue to follow.    Recommendations for follow up therapy are one component of a multi-disciplinary discharge planning process, led by the attending physician.  Recommendations may be updated based on patient status, additional functional criteria and insurance authorization.  Follow Up Recommendations  Skilled nursing-short term rehab (<3 hours/day) Can patient physically be transported by private vehicle: Yes   Assistance Recommended at Discharge Frequent or constant Supervision/Assistance  Patient can return home with the following A little help with walking and/or transfers;A little help with bathing/dressing/bathroom;Assistance with cooking/housework;Direct supervision/assist for medications management;Assist for transportation;Help with stairs or ramp for entrance   Equipment Recommendations  None recommended by PT    Recommendations for Other Services       Precautions / Restrictions Precautions Precautions: Fall Precaution Comments: posey belt and wrist restraints Restrictions Weight Bearing Restrictions: No     Mobility  Bed Mobility Overal bed mobility: Needs Assistance Bed Mobility: Supine to Sit, Sit to Supine     Supine to sit: Min assist, +2 for safety/equipment Sit to supine: Mod  assist, +2 for safety/equipment   General bed mobility comments: trunk support to ascend, B LE support to return supine; max verbal cues    Transfers Overall transfer level: Needs assistance Equipment used: Rolling walker (2 wheels), None Transfers: Sit to/from Stand Sit to Stand: Min assist, +2 safety/equipment           General transfer comment: min assist +2 safety to ascend with legs braced on bed, up to max assist to maintain balance in standing with heavy posterior lean and poor use of walker for stability.    Ambulation/Gait Ambulation/Gait assistance: Max assist, +2 physical assistance Gait Distance (Feet): 120 Feet Assistive device: Rolling walker (2 wheels), 2 person hand held assist Gait Pattern/deviations: Step-through pattern, Decreased stride length, Drifts right/left Gait velocity: decreased Gait velocity interpretation: <1.31 ft/sec, indicative of household ambulator   General Gait Details: Poor use of walker, attempting to lift it up and carry it. Opted for HHA instead, and pt more agreeable to this. Noted significant drifting and occasional staggering in hall. Poor path finding and required cues to find her room number.   Stairs             Wheelchair Mobility    Modified Rankin (Stroke Patients Only) Modified Rankin (Stroke Patients Only) Pre-Morbid Rankin Score: No symptoms Modified Rankin: Moderately severe disability     Balance Overall balance assessment: Needs assistance Sitting-balance support: No upper extremity supported, Feet supported Sitting balance-Leahy Scale: Poor     Standing balance support: No upper extremity supported, Bilateral upper extremity supported, During functional activity Standing balance-Leahy Scale: Poor Standing balance comment: relies on external support  Cognition Arousal/Alertness: Awake/alert Behavior During Therapy: Restless, Impulsive Overall Cognitive Status:  Impaired/Different from baseline Area of Impairment: Orientation, Attention, Memory, Following commands, Safety/judgement, Awareness, Problem solving                 Orientation Level: Disoriented to, Place, Time, Situation Current Attention Level: Focused Memory: Decreased short-term memory, Decreased recall of precautions Following Commands: Follows one step commands inconsistently, Follows one step commands with increased time Safety/Judgement: Decreased awareness of safety, Decreased awareness of deficits Awareness: Intellectual Problem Solving: Slow processing, Decreased initiation, Difficulty sequencing, Requires tactile cues, Requires verbal cues General Comments: pt perseverating on her "money" (a piece of paper with phone # on it), pleasantly confused throughout session.  following approx 50% of commands with increased time and multimodal cueing. Requires max cueing for safety and problem solving.        Exercises      General Comments        Pertinent Vitals/Pain Pain Assessment Pain Assessment: Faces Faces Pain Scale: No hurt    Home Living                          Prior Function            PT Goals (current goals can now be found in the care plan section) Acute Rehab PT Goals Patient Stated Goal: Go out to smoke a cigarette PT Goal Formulation: With patient Time For Goal Achievement: 12/23/21 Potential to Achieve Goals: Fair Progress towards PT goals: Progressing toward goals    Frequency    Min 3X/week      PT Plan Current plan remains appropriate    Co-evaluation PT/OT/SLP Co-Evaluation/Treatment: Yes Reason for Co-Treatment: Necessary to address cognition/behavior during functional activity;For patient/therapist safety;To address functional/ADL transfers PT goals addressed during session: Mobility/safety with mobility;Balance;Proper use of DME OT goals addressed during session: ADL's and self-care      AM-PAC PT "6 Clicks"  Mobility   Outcome Measure  Help needed turning from your back to your side while in a flat bed without using bedrails?: A Little Help needed moving from lying on your back to sitting on the side of a flat bed without using bedrails?: A Lot Help needed moving to and from a bed to a chair (including a wheelchair)?: A Lot Help needed standing up from a chair using your arms (e.g., wheelchair or bedside chair)?: A Lot Help needed to walk in hospital room?: Total Help needed climbing 3-5 steps with a railing? : Total 6 Click Score: 11    End of Session Equipment Utilized During Treatment: Gait belt Activity Tolerance: Patient tolerated treatment well;Patient limited by lethargy Patient left: in bed;with call bell/phone within reach;with restraints reapplied;with bed alarm set;with family/visitor present Nurse Communication: Mobility status PT Visit Diagnosis: Other abnormalities of gait and mobility (R26.89);Other symptoms and signs involving the nervous system (Y81.448)     Time: 1856-3149 PT Time Calculation (min) (ACUTE ONLY): 23 min  Charges:  $Gait Training: 8-22 mins                     Deborah [name], PT, DPT Acute Rehabilitation Services Secure Chat Preferred Office: [phone]    Deborah [name] 12/19/2021, 2:17 PM

## 2021-12-19 NOTE — TOC Progression Note (Signed)
Transition of Care [facility]) - Progression Note    Patient Details  Name: Deborah [name] MRN: [id number] Date of Birth: 1946/01/18  Transition of Care [facility]) CM/SW [place], [place] Phone Number: 12/19/2021, 3:38 PM  Clinical Narrative:   CSW alerted by RN that patient had family at bedside. CSW met with patient's niece, Lannette Donath, and friend, Darryl, at bedside. Lannette Donath in agreement to assisting with paperwork for [facility], and will also be taking the patient home to [place] after rehab so the family can better look after her, as all the family is up in [place] and nobody's in [place] anymore. CSW added niece, Lannette Donath, to patient contact information. CSW answered questions and discussed discharge plan. CSW to follow.    Expected Discharge Plan: [place] Barriers to Discharge: Continued Medical Work up  Expected Discharge Plan and Services Expected Discharge Plan: [place] In-house Referral: Clinical Social Work Discharge Planning Services: CM Consult   Living arrangements for the past 2 months: Single Family Home                                       Social Determinants of Health (SDOH) Interventions    Readmission Risk Interventions     No data to display

## 2021-12-19 NOTE — Plan of Care (Signed)
  Problem: Education: Goal: Knowledge of General Education information will improve Description Including pain rating scale, medication(s)/side effects and non-pharmacologic comfort measures Outcome: Progressing   Problem: Health Behavior/Discharge Planning: Goal: Ability to manage health-related needs will improve Outcome: Progressing   

## 2021-12-19 NOTE — Progress Notes (Signed)
PROGRESS NOTE    Deborah [name]  BOF:[id number] DOB: [birth year] DOA: 12/08/2021 PCP: Pcp, No   Brief Narrative:  Deborah [name] is a 76 yo female with PMH anxiety, asthma, cocaine use, history of CVA, HTN, tobacco use, alcohol use who was brought to the hospital with altered mentation and found being down at a bus stop. Urine drug screen was recently positive for cocaine and benzos.  Initial CT head was concerning for PRES syndrome; she underwent MRI brain which showed acute infarct involving left thalamus and ruled out PRES. Due to ongoing altered mentation, she also underwent EEG which ruled out underlying seizure activity.  Assessment & Plan:   Principal Problem:   Acute metabolic encephalopathy Active Problems:   Cerebrovascular accident (CVA) of left thalamus (HCC)   Essential hypertension   Substance abuse (HCC)   Mood disorder (HCC)  Acute metabolic encephalopathy, ongoing - seizure activity ruled out per neurology and with EEG - etiology presumed multifactorial from substance use, hypertensive encephalopathy, and CVA - continue PRN ativan or haldol for agitation - continue restraints if still needed -Patient's mental status stabilizing but unfortunately remains alert and oriented to person only.  Attempt to wean sedation and restraints as appropriate.   Cerebrovascular accident (CVA) of left thalamus (HCC) - MRI on admission shows acute infarct involving left thalamus -Counseled on drug use cessation - Continue aspirin - Neurology evaluated    Essential hypertension - Add amlodipine - not well controlled on lisinopril and PRN labetolol/hydralazine   Mood disorder (HCC) - Patient becoming more agitated at night and difficulty with redirection; also she is combative towards staff trying to "bite" people - Geodon IM shot needed last night - Increased Zyprexa dose - use haldol or ativan PRN still    Substance abuse (HCC) - UDS noted with cocaine on admission - TOC consult  placed  DVT prophylaxis: Heparin Code Status: Full Family Communication: None present; friend is only point of contact - unavailable today  Status is: Inpatient  Dispo: The patient is from: Home              Anticipated d/c is to: To be determined              Anticipated d/c date is: 48 to 72 hours              Patient currently not medically stable for discharge  Consultants:  Neurology  Procedures:  None  Antimicrobials:  None  Subjective: No acute issues or events overnight, review of systems limited, patient oriented to person only, thinks we are currently located at her home, Deborah [name] Hence is president and is September 1978.  Otherwise able to follow commands  Objective: Vitals:   12/18/21 2300 12/18/21 2330 12/19/21 0404 12/19/21 0738  BP: (!) 141/83 (!) 149/82 (!) 158/100 (!) 160/89  Pulse: 68 70 73 85  Resp: 16 18 16 19   Temp: 98.2 F (36.8 C) 97.8 F (36.6 C) 98.5 F (36.9 C) (!) 97.5 F (36.4 C)  TempSrc: Oral Oral Oral Oral  SpO2: 100% 100% 100% 97%  Weight:      Height:        Intake/Output Summary (Last 24 hours) at 12/19/2021 0809 Last data filed at 12/18/2021 1830 Gross per 24 hour  Intake 480 ml  Output --  Net 480 ml    Filed Weights   12/09/21 0500 12/10/21 0500 12/11/21 0500  Weight: 56.2 kg 55.2 kg 52.2 kg    Examination:  General:  Pleasantly resting in  bed, No acute distress. HEENT:  Normocephalic atraumatic.  Sclerae nonicteric, noninjected.  Extraocular movements intact bilaterally. Neck:  Without mass or deformity.  Trachea is midline. Lungs:  Clear to auscultate bilaterally without rhonchi, wheeze, or rales. Heart:  Regular rate and rhythm.  Without murmurs, rubs, or gallops. Abdomen:  Soft, nontender, nondistended.  Without guarding or rebound. Extremities: Without cyanosis, clubbing, edema, or obvious deformity. Vascular:  Dorsalis pedis and posterior tibial pulses palpable bilaterally. Skin:  Warm and dry, no erythema, no  ulcerations.   Data Reviewed: I have personally reviewed following labs and imaging studies  CBC: Recent Labs  Lab 12/13/21 0033 12/14/21 0456  WBC 6.4 5.0  NEUTROABS 3.9 1.8  HGB 13.3 12.3  HCT 39.6 36.5  MCV 95.4 95.5  PLT 326 286    Basic Metabolic Panel: Recent Labs  Lab 12/13/21 0033 12/14/21 0456  NA 142 139  K 3.8 4.1  CL 106 106  CO2 23 23  GLUCOSE 97 90  BUN 20 27*  CREATININE 1.06* 1.06*  CALCIUM 9.8 9.5  MG 2.0 2.1    GFR: Estimated Creatinine Clearance: 36.6 mL/min (A) (by C-G formula based on SCr of 1.06 mg/dL (H)). Liver Function Tests: No results for input(s): "AST", "ALT", "ALKPHOS", "BILITOT", "PROT", "ALBUMIN" in the last 168 hours. No results for input(s): "LIPASE", "AMYLASE" in the last 168 hours. No results for input(s): "AMMONIA" in the last 168 hours. Coagulation Profile: No results for input(s): "INR", "PROTIME" in the last 168 hours. Cardiac Enzymes: No results for input(s): "CKTOTAL", "CKMB", "CKMBINDEX", "TROPONINI" in the last 168 hours. BNP (last 3 results) No results for input(s): "PROBNP" in the last 8760 hours. HbA1C: No results for input(s): "HGBA1C" in the last 72 hours. CBG: Recent Labs  Lab 12/12/21 1131 12/12/21 1514  GLUCAP 142* 126*    Lipid Profile: No results for input(s): "CHOL", "HDL", "LDLCALC", "TRIG", "CHOLHDL", "LDLDIRECT" in the last 72 hours. Thyroid Function Tests: No results for input(s): "TSH", "T4TOTAL", "FREET4", "T3FREE", "THYROIDAB" in the last 72 hours. Anemia Panel: No results for input(s): "VITAMINB12", "FOLATE", "FERRITIN", "TIBC", "IRON", "RETICCTPCT" in the last 72 hours. Sepsis Labs: No results for input(s): "PROCALCITON", "LATICACIDVEN" in the last 168 hours.  No results found for this or any previous visit (from the past 240 hour(s)).   Radiology Studies: No results found.  Scheduled Meds:  amLODipine  5 mg Oral Daily   aspirin  300 mg Rectal Daily   Or   aspirin  325 mg Oral  Daily   FLUoxetine  20 mg Oral Daily   heparin  5,000 Units Subcutaneous Q8H   lisinopril  20 mg Oral Daily   OLANZapine  5 mg Oral QPM   rosuvastatin  10 mg Oral Daily   Continuous Infusions:  sodium chloride       LOS: 11 days   Time spent:  Azucena Fallen, DO [facility]  If 7PM-7AM, please contact night-coverage [email or web address]  12/19/2021, 8:09 AM

## 2021-12-20 DIAGNOSIS — E43 Unspecified severe protein-calorie malnutrition: Secondary | ICD-10-CM | POA: Insufficient documentation

## 2021-12-20 DIAGNOSIS — G9341 Metabolic encephalopathy: Secondary | ICD-10-CM | POA: Diagnosis not present

## 2021-12-20 MED ORDER — ENSURE ENLIVE PO LIQD
237.0000 mL | Freq: Three times a day (TID) | ORAL | Status: DC
  Administered 2021-12-20 – 2021-12-27 (×16): 237 mL via ORAL

## 2021-12-20 MED ORDER — ADULT MULTIVITAMIN W/MINERALS CH
1.0000 | ORAL_TABLET | Freq: Every day | ORAL | Status: DC
  Administered 2021-12-20 – 2021-12-27 (×8): 1 via ORAL
  Filled 2021-12-20 (×8): qty 1

## 2021-12-20 NOTE — Progress Notes (Signed)
PROGRESS NOTE    Deborah [name]  XLK:[id number] DOB: 1946/01/04 DOA: 12/08/2021 PCP: Pcp, No   Brief Narrative:  Ms. Santor is a 76 yo female with PMH anxiety, asthma, cocaine use, history of CVA, HTN, tobacco use, alcohol use who was brought to the hospital with altered mentation and found being down at a bus stop. Urine drug screen was recently positive for cocaine and benzos.  Initial CT head was concerning for PRES syndrome; she underwent MRI brain which showed acute infarct involving left thalamus and ruled out PRES. Due to ongoing altered mentation, she also underwent EEG which ruled out underlying seizure activity.  Patient's mental status continues to wax/wane over the last 72 hours but generally and slowly improving. Patient remains cleared for disposition from a medical standpoint but given her mental status, behavioral outbursts/episodes she remains high risk for dispo without around the clock care. Family traveling down to evaluate patient and hopefully assist with placement planning.  Assessment & Plan:   Principal Problem:   Acute metabolic encephalopathy Active Problems:   Cerebrovascular accident (CVA) of left thalamus (HCC)   Essential hypertension   Substance abuse (HCC)   Mood disorder (HCC)  Acute metabolic encephalopathy, ongoing, minimally improving - seizure activity ruled out per neurology and with EEG - etiology presumed multifactorial from substance use, hypertensive encephalopathy, and CVA - continue PRN ativan or haldol for agitation - continue restraints if still needed -Patient's mental status stabilizing - much more appropriate over the past 24h   Cerebrovascular accident (CVA) of left thalamus (HCC) - MRI on admission shows acute infarct involving left thalamus -Counseled on drug use cessation - Continue aspirin - Neurology evaluated    Essential hypertension - Add amlodipine - not well controlled on lisinopril and PRN labetolol/hydralazine   Mood  disorder (HCC) - Patient becoming more agitated at night and difficulty with redirection; also she is combative towards staff trying to "bite" people - Geodon IM shot needed last night - Increased Zyprexa dose - use haldol or ativan PRN still    Substance abuse (HCC) - UDS noted with cocaine on admission - TOC consult placed  DVT prophylaxis: Heparin Code Status: Full Family Communication: None present; patient's niece is visiting from out of state - will try to reach out and discuss plan of care.  Status is: Inpatient  Dispo: The patient is from: Home              Anticipated d/c is to: To be determined              Anticipated d/c date is: 48 to 72 hours              Patient currently not medically stable for discharge  Consultants:  Neurology  Procedures:  None  Antimicrobials:  None  Subjective: No acute issues or events overnight, review of systems limited, patient more awake alert - oriented to person, state/city, but not location/date.  Objective: Vitals:   12/19/21 1805 12/19/21 1943 12/20/21 0039 12/20/21 0433  BP: (!) 144/77 (!) 160/83 (!) 112/92 124/67  Pulse: 79 88 68 (!) 55  Resp: 20 16 16    Temp: 98.4 F (36.9 C) 97.7 F (36.5 C) 98.5 F (36.9 C) 98.1 F (36.7 C)  TempSrc: Oral Oral Oral   SpO2: 100% 97% 100% 100%  Weight:      Height:        Intake/Output Summary (Last 24 hours) at 12/20/2021 0740 Last data filed at 12/20/2021 0528 Gross per 24  hour  Intake 300 ml  Output 750 ml  Net -450 ml    Filed Weights   12/09/21 0500 12/10/21 0500 12/11/21 0500  Weight: 56.2 kg 55.2 kg 52.2 kg    Examination:  General:  Pleasantly resting in bed, No acute distress. HEENT:  Normocephalic atraumatic.  Sclerae nonicteric, noninjected.  Extraocular movements intact bilaterally. Neck:  Without mass or deformity.  Trachea is midline. Lungs:  Clear to auscultate bilaterally without rhonchi, wheeze, or rales. Heart:  Regular rate and rhythm.  Without  murmurs, rubs, or gallops. Abdomen:  Soft, nontender, nondistended.  Without guarding or rebound. Extremities: Without cyanosis, clubbing, edema, or obvious deformity. Vascular:  Dorsalis pedis and posterior tibial pulses palpable bilaterally. Skin:  Warm and dry, no erythema, no ulcerations.   Data Reviewed: I have personally reviewed following labs and imaging studies  CBC: Recent Labs  Lab 12/14/21 0456  WBC 5.0  NEUTROABS 1.8  HGB 12.3  HCT 36.5  MCV 95.5  PLT 286    Basic Metabolic Panel: Recent Labs  Lab 12/14/21 0456  NA 139  K 4.1  CL 106  CO2 23  GLUCOSE 90  BUN 27*  CREATININE 1.06*  CALCIUM 9.5  MG 2.1    GFR: Estimated Creatinine Clearance: 36.6 mL/min (A) (by C-G formula based on SCr of 1.06 mg/dL (H)). Liver Function Tests: No results for input(s): "AST", "ALT", "ALKPHOS", "BILITOT", "PROT", "ALBUMIN" in the last 168 hours. No results for input(s): "LIPASE", "AMYLASE" in the last 168 hours. No results for input(s): "AMMONIA" in the last 168 hours. Coagulation Profile: No results for input(s): "INR", "PROTIME" in the last 168 hours. Cardiac Enzymes: No results for input(s): "CKTOTAL", "CKMB", "CKMBINDEX", "TROPONINI" in the last 168 hours. BNP (last 3 results) No results for input(s): "PROBNP" in the last 8760 hours. HbA1C: No results for input(s): "HGBA1C" in the last 72 hours. CBG: No results for input(s): "GLUCAP" in the last 168 hours.  Lipid Profile: No results for input(s): "CHOL", "HDL", "LDLCALC", "TRIG", "CHOLHDL", "LDLDIRECT" in the last 72 hours. Thyroid Function Tests: No results for input(s): "TSH", "T4TOTAL", "FREET4", "T3FREE", "THYROIDAB" in the last 72 hours. Anemia Panel: No results for input(s): "VITAMINB12", "FOLATE", "FERRITIN", "TIBC", "IRON", "RETICCTPCT" in the last 72 hours. Sepsis Labs: No results for input(s): "PROCALCITON", "LATICACIDVEN" in the last 168 hours.  No results found for this or any previous visit (from  the past 240 hour(s)).   Radiology Studies: No results found.  Scheduled Meds:  amLODipine  5 mg Oral Daily   aspirin  300 mg Rectal Daily   Or   aspirin  325 mg Oral Daily   FLUoxetine  20 mg Oral Daily   heparin  5,000 Units Subcutaneous Q8H   lisinopril  20 mg Oral Daily   OLANZapine  5 mg Oral QPM   rosuvastatin  10 mg Oral Daily   Continuous Infusions:  sodium chloride       LOS: 12 days   Time spent:  Azucena Fallen, DO [facility]  If 7PM-7AM, please contact night-coverage [email or web address]  12/20/2021, 7:40 AM

## 2021-12-20 NOTE — Plan of Care (Signed)
  Problem: Education: Goal: Knowledge of General Education information will improve Description: Including pain rating scale, medication(s)/side effects and non-pharmacologic comfort measures 12/20/2021 1606 by Genevie Ann, RN Outcome: Progressing 12/20/2021 1605 by Genevie Ann, RN Outcome: Progressing   Problem: Health Behavior/Discharge Planning: Goal: Ability to manage health-related needs will improve Outcome: Progressing

## 2021-12-20 NOTE — Progress Notes (Signed)
Initial Nutrition Assessment  DOCUMENTATION CODES:  Severe malnutrition in context of chronic illness  INTERVENTION:  Liberalize diet to regular Increase Ensure Enlive po to TID, each supplement provides 350 kcal and 20 grams of protein. MVI with minerals daily  NUTRITION DIAGNOSIS:  Severe Malnutrition related to social / environmental circumstances as evidenced by severe fat depletion, severe muscle depletion.  GOAL:  Patient will meet greater than or equal to 90% of their needs  MONITOR:  Supplement acceptance, Skin, Labs, PO intake  REASON FOR ASSESSMENT:  Other (Comment), LOS (low BMI)    ASSESSMENT:  Pt with hx of HTN, hx of EtOH abuse and tobacco abuse, cocaine abuse, and hx CVA presented to ED after being found down at the bus stop. Pt with AMS and imaging shows a left thalamic stroke. Also positive for cocaine on admission.  Pt resting in bed at the time of assessment, visitor at bedside assisting with feeding lunch. Pt denies recent weight loss or decreased appetite. Quite thin on exam suggestive of long-term under nourishment.   Pt reports eating 2 meals a day at baseline and usual weight is ~130 lbs.   Will liberalize diet and increase ensure supplements to encourage weight gain and adequate intake.  Average Meal Intake: 6/26-6/29: 73% intake x 8 recorded meals  Nutritionally Relevant Medications: Scheduled Meds:  rosuvastatin  10 mg Oral Daily   Continuous Infusions:  sodium chloride     Labs Reviewed: BUN 27, creatinine 1.06  NUTRITION - FOCUSED PHYSICAL EXAM: Flowsheet Row Most Recent Value  Orbital Region Severe depletion  Upper Arm Region Moderate depletion  Thoracic and Lumbar Region Moderate depletion  Buccal Region Severe depletion  Temple Region Moderate depletion  Clavicle Bone Region Severe depletion  Clavicle and Acromion Bone Region Severe depletion  Scapular Bone Region Severe depletion  Dorsal Hand Moderate depletion  Patellar Region  Severe depletion  Anterior Thigh Region Severe depletion  Posterior Calf Region Severe depletion  Edema (RD Assessment) None  Hair Reviewed  Eyes Reviewed  Mouth Reviewed  Skin Reviewed  Nails Reviewed   Diet Order:   Diet Order             Diet Heart Room service appropriate? Yes; Fluid consistency: Thin  Diet effective now                   EDUCATION NEEDS:  No education needs have been identified at this time  Skin:  Skin Assessment: Reviewed RN Assessment  Last BM:  6/28  Height:  Ht Readings from Last 1 Encounters:  12/08/21 5\' 6"  (1.676 m)    Weight:  Wt Readings from Last 1 Encounters:  12/11/21 52.2 kg    Ideal Body Weight:  59.1 kg  BMI:  Body mass index is 18.57 kg/m.  Estimated Nutritional Needs:  Kcal:  1600-1800 kcal/d Protein:  80-95g/d Fluid:  >1.8L/d    12/13/21, RD, LDN Clinical Dietitian RD pager # available in AMION  After hours/weekend pager # available in [facility]

## 2021-12-20 NOTE — Plan of Care (Signed)
  Problem: Education: Goal: Knowledge of General Education information will improve Description Including pain rating scale, medication(s)/side effects and non-pharmacologic comfort measures Outcome: Progressing   

## 2021-12-21 DIAGNOSIS — G9341 Metabolic encephalopathy: Secondary | ICD-10-CM | POA: Diagnosis not present

## 2021-12-21 NOTE — Progress Notes (Signed)
PROGRESS NOTE    Deborah [name]  FIE:[id number] DOB: 1946/05/03 DOA: 12/08/2021 PCP: Pcp, No   Brief Narrative:  Deborah [name] is a 76 yo female with PMH anxiety, asthma, cocaine use, history of CVA, HTN, tobacco use, alcohol use who was brought to the hospital with altered mentation and found being down at a bus stop. Urine drug screen was recently positive for cocaine and benzos.  Initial CT head was concerning for PRES syndrome; she underwent MRI brain which showed acute infarct involving left thalamus and ruled out PRES. Due to ongoing altered mentation, she also underwent EEG which ruled out underlying seizure activity.  Patient's mental status continues to wax/wane over the last 72 hours but generally and slowly improving. Patient remains cleared for disposition from a medical standpoint but given her mental status, behavioral outbursts/episodes she remains high risk for dispo without around the clock care. Family traveling down to evaluate patient and hopefully assist with placement planning.  Assessment & Plan:   Principal Problem:   Acute metabolic encephalopathy Active Problems:   Cerebrovascular accident (CVA) of left thalamus (HCC)   Essential hypertension   Substance abuse (HCC)   Mood disorder (HCC)   Protein-calorie malnutrition, severe  Acute metabolic encephalopathy, ongoing, minimally improving - seizure activity ruled out per neurology and with EEG - etiology presumed multifactorial from substance use, hypertensive encephalopathy, and CVA - continue PRN ativan or haldol for agitation - continue restraints if still needed -Patient's mental status stabilizing - much more appropriate over the past 24h   Cerebrovascular accident (CVA) of left thalamus (HCC) - MRI on admission shows acute infarct involving left thalamus - Counseled on drug use cessation - Continue aspirin - Neurology evaluated    Essential hypertension - Continue amlodipine, lisinopril  Mood disorder  (HCC) - Patient becoming more agitated at night and difficulty with redirection; also she is combative towards staff trying to "bite" people - Geodon IM shot needed last night - Increased Zyprexa dose - use haldol or ativan PRN still    Substance abuse (HCC) - UDS noted with cocaine on admission - TOC consult placed  DVT prophylaxis: Heparin Code Status: Full Family Communication: None present; patient's niece is visiting from out of state - will try to reach out and discuss plan of care.  Status is: Inpatient  Dispo: The patient is from: Home              Anticipated d/c is to: To be determined              Anticipated d/c date is: 48 to 72 hours              Patient currently not medically stable for discharge  Consultants:  Neurology  Procedures:  None  Antimicrobials:  None  Subjective: No acute issues or events overnight, review of systems limited, patient more awake alert oriented today to person and general situation place and family.  Date and month are still wrong but generally more calm today, apologetic for previous behavior while she was confused.  Objective: Vitals:   12/20/21 1931 12/20/21 2319 12/21/21 0333 12/21/21 0340  BP: (!) 95/51 (!) 89/51 (!) 86/52 (!) 96/54  Pulse: 71 62 (!) 57   Resp: 18 18 18    Temp: 98.8 F (37.1 C) 97.9 F (36.6 C) 98.4 F (36.9 C)   TempSrc: Oral Oral    SpO2: 97% 99% 100%   Weight:      Height:  Intake/Output Summary (Last 24 hours) at 12/21/2021 0757 Last data filed at 12/20/2021 2325 Gross per 24 hour  Intake 1080 ml  Output 550 ml  Net 530 ml    Filed Weights   12/09/21 0500 12/10/21 0500 12/11/21 0500  Weight: 56.2 kg 55.2 kg 52.2 kg    Examination:  General:  Pleasantly resting in bed, No acute distress. HEENT:  Normocephalic atraumatic.  Sclerae nonicteric, noninjected.  Extraocular movements intact bilaterally. Neck:  Without mass or deformity.  Trachea is midline. Lungs:  Clear to auscultate  bilaterally without rhonchi, wheeze, or rales. Heart:  Regular rate and rhythm.  Without murmurs, rubs, or gallops. Abdomen:  Soft, nontender, nondistended.  Without guarding or rebound. Extremities: Without cyanosis, clubbing, edema, or obvious deformity. Vascular:  Dorsalis pedis and posterior tibial pulses palpable bilaterally. Skin:  Warm and dry, no erythema, no ulcerations.   Data Reviewed: I have personally reviewed following labs and imaging studies  CBC: No results for input(s): "WBC", "NEUTROABS", "HGB", "HCT", "MCV", "PLT" in the last 168 hours.  Basic Metabolic Panel: No results for input(s): "NA", "K", "CL", "CO2", "GLUCOSE", "BUN", "CREATININE", "CALCIUM", "MG", "PHOS" in the last 168 hours.  GFR: Estimated Creatinine Clearance: 36.6 mL/min (A) (by C-G formula based on SCr of 1.06 mg/dL (H)). Liver Function Tests: No results for input(s): "AST", "ALT", "ALKPHOS", "BILITOT", "PROT", "ALBUMIN" in the last 168 hours. No results for input(s): "LIPASE", "AMYLASE" in the last 168 hours. No results for input(s): "AMMONIA" in the last 168 hours. Coagulation Profile: No results for input(s): "INR", "PROTIME" in the last 168 hours. Cardiac Enzymes: No results for input(s): "CKTOTAL", "CKMB", "CKMBINDEX", "TROPONINI" in the last 168 hours. BNP (last 3 results) No results for input(s): "PROBNP" in the last 8760 hours. HbA1C: No results for input(s): "HGBA1C" in the last 72 hours. CBG: No results for input(s): "GLUCAP" in the last 168 hours.  Lipid Profile: No results for input(s): "CHOL", "HDL", "LDLCALC", "TRIG", "CHOLHDL", "LDLDIRECT" in the last 72 hours. Thyroid Function Tests: No results for input(s): "TSH", "T4TOTAL", "FREET4", "T3FREE", "THYROIDAB" in the last 72 hours. Anemia Panel: No results for input(s): "VITAMINB12", "FOLATE", "FERRITIN", "TIBC", "IRON", "RETICCTPCT" in the last 72 hours. Sepsis Labs: No results for input(s): "PROCALCITON", "LATICACIDVEN" in the  last 168 hours.  No results found for this or any previous visit (from the past 240 hour(s)).   Radiology Studies: No results found.  Scheduled Meds:  amLODipine  5 mg Oral Daily   aspirin  300 mg Rectal Daily   Or   aspirin  325 mg Oral Daily   feeding supplement  237 mL Oral TID BM   FLUoxetine  20 mg Oral Daily   heparin  5,000 Units Subcutaneous Q8H   lisinopril  20 mg Oral Daily   multivitamin with minerals  1 tablet Oral Daily   OLANZapine  5 mg Oral QPM   rosuvastatin  10 mg Oral Daily   Continuous Infusions:  sodium chloride       LOS: 13 days   Time spent:  Azucena Fallen, DO [facility]  If 7PM-7AM, please contact night-coverage [email or web address]  12/21/2021, 7:57 AM

## 2021-12-22 DIAGNOSIS — G9341 Metabolic encephalopathy: Secondary | ICD-10-CM | POA: Diagnosis not present

## 2021-12-22 LAB — GLUCOSE, CAPILLARY: Glucose-Capillary: 125 mg/dL — ABNORMAL HIGH (ref 70–99)

## 2021-12-22 NOTE — Progress Notes (Signed)
Patient confused, agitated, and combative despite pharmacologic and non-pharmacologic interventions. Plan to use soft restraints for patient safety, reassess frequently, and remove as soon as possible.

## 2021-12-22 NOTE — Progress Notes (Signed)
Pt has been getting out of bed at least q30 minutes this evening.  As the night has progressed, she has become more verbally aggressive and using curse words.  PRN Ativan given with little to no effect.  Pt then started pulling against staff and was not redirectable or distracted.  PRN Haldol given and MD notified.  Order for soft waist belt obtained and initiated.   Hilton Sinclair BSN RN CMSRN 12/22/2021, 1:57 AM

## 2021-12-22 NOTE — Progress Notes (Signed)
PROGRESS NOTE    Deborah [name]  ZOX:[id number] DOB: Oct 31, 1945 DOA: 12/08/2021 PCP: Pcp, No   Brief Narrative:  Deborah [name] is a 76 yo female with PMH anxiety, asthma, cocaine use, history of CVA, HTN, tobacco use, alcohol use who was brought to the hospital with altered mentation and found being down at a bus stop. Urine drug screen was recently positive for cocaine and benzos.  Initial CT head was concerning for PRES syndrome; she underwent MRI brain which showed acute infarct involving left thalamus and ruled out PRES. Due to ongoing altered mentation, she also underwent EEG which ruled out underlying seizure activity.  Patient's mental status continues to wax/wane over the last 72 hours but generally and slowly improving. Patient remains cleared for disposition from a medical standpoint but given her mental status, behavioral outbursts/episodes she remains high risk for dispo without around the clock care. Family traveling down to evaluate patient and hopefully assist with placement planning.  Assessment & Plan:   Principal Problem:   Acute metabolic encephalopathy Active Problems:   Cerebrovascular accident (CVA) of left thalamus (HCC)   Essential hypertension   Substance abuse (HCC)   Mood disorder (HCC)   Protein-calorie malnutrition, severe  Acute metabolic encephalopathy, ongoing, minimally improving - seizure activity ruled out per neurology and with EEG - etiology presumed multifactorial from substance use, hypertensive encephalopathy, and CVA - continue PRN ativan or haldol for agitation - Patient's mental status stabilizing - much more appropriate over the past 24h -Confusion again overnight, restraints were placed, removed this morning tolerating well   Cerebrovascular accident (CVA) of left thalamus (HCC) - MRI on admission shows acute infarct involving left thalamus - Counseled on drug use cessation - Continue aspirin - Neurology evaluated    Essential  hypertension - Continue amlodipine, lisinopril  Mood disorder (HCC) - Patient becoming more agitated at night and difficulty with redirection; also she is combative towards staff trying to "bite" people - Geodon IM shot needed last night - Increased Zyprexa dose - use haldol or ativan PRN still    Substance abuse (HCC) - UDS noted with cocaine on admission - TOC consult placed  DVT prophylaxis: Heparin Code Status: Full Family Communication: None present; patient's niece is visiting from out of state - will try to reach out and discuss plan of care.  Status is: Inpatient  Dispo: The patient is from: Home              Anticipated d/c is to: To be determined              Anticipated d/c date is: 48 to 72 hours              Patient currently not medically stable for discharge  Consultants:  Neurology  Procedures:  None  Antimicrobials:  None  Subjective: Transiently confused again overnight requiring restraints, removed this morning, patient more oriented but somewhat agitated; review of systems limited  Objective: Vitals:   12/21/21 1952 12/22/21 0003 12/22/21 0350 12/22/21 0738  BP: (!) 114/92 (!) 145/59 (!) 141/68 118/63  Pulse: (!) 56 73 67 64  Resp: 20 20 20 18   Temp: 98.2 F (36.8 C) 98.8 F (37.1 C) 98.4 F (36.9 C) 98.3 F (36.8 C)  TempSrc: Oral Oral    SpO2: 100% 98% 99% 100%  Weight:      Height:        Intake/Output Summary (Last 24 hours) at 12/22/2021 0746 Last data filed at 12/21/2021 2200 Gross per 24  hour  Intake 1420 ml  Output 300 ml  Net 1120 ml    Filed Weights   12/09/21 0500 12/10/21 0500 12/11/21 0500  Weight: 56.2 kg 55.2 kg 52.2 kg    Examination:  General:  Pleasantly resting in bed, No acute distress. HEENT:  Normocephalic atraumatic.  Sclerae nonicteric, noninjected.  Extraocular movements intact bilaterally. Neck:  Without mass or deformity.  Trachea is midline. Lungs:  Clear to auscultate bilaterally without rhonchi,  wheeze, or rales. Heart:  Regular rate and rhythm.  Without murmurs, rubs, or gallops. Abdomen:  Soft, nontender, nondistended.  Without guarding or rebound. Extremities: Without cyanosis, clubbing, edema, or obvious deformity. Vascular:  Dorsalis pedis and posterior tibial pulses palpable bilaterally. Skin:  Warm and dry, no erythema, no ulcerations.   Data Reviewed: I have personally reviewed following labs and imaging studies  CBC: No results for input(s): "WBC", "NEUTROABS", "HGB", "HCT", "MCV", "PLT" in the last 168 hours.  Basic Metabolic Panel: No results for input(s): "NA", "K", "CL", "CO2", "GLUCOSE", "BUN", "CREATININE", "CALCIUM", "MG", "PHOS" in the last 168 hours.  GFR: Estimated Creatinine Clearance: 36.6 mL/min (A) (by C-G formula based on SCr of 1.06 mg/dL (H)). Liver Function Tests: No results for input(s): "AST", "ALT", "ALKPHOS", "BILITOT", "PROT", "ALBUMIN" in the last 168 hours. No results for input(s): "LIPASE", "AMYLASE" in the last 168 hours. No results for input(s): "AMMONIA" in the last 168 hours. Coagulation Profile: No results for input(s): "INR", "PROTIME" in the last 168 hours. Cardiac Enzymes: No results for input(s): "CKTOTAL", "CKMB", "CKMBINDEX", "TROPONINI" in the last 168 hours. BNP (last 3 results) No results for input(s): "PROBNP" in the last 8760 hours. HbA1C: No results for input(s): "HGBA1C" in the last 72 hours. CBG: No results for input(s): "GLUCAP" in the last 168 hours.  Lipid Profile: No results for input(s): "CHOL", "HDL", "LDLCALC", "TRIG", "CHOLHDL", "LDLDIRECT" in the last 72 hours. Thyroid Function Tests: No results for input(s): "TSH", "T4TOTAL", "FREET4", "T3FREE", "THYROIDAB" in the last 72 hours. Anemia Panel: No results for input(s): "VITAMINB12", "FOLATE", "FERRITIN", "TIBC", "IRON", "RETICCTPCT" in the last 72 hours. Sepsis Labs: No results for input(s): "PROCALCITON", "LATICACIDVEN" in the last 168 hours.  No results  found for this or any previous visit (from the past 240 hour(s)).   Radiology Studies: No results found.  Scheduled Meds:  amLODipine  5 mg Oral Daily   aspirin  300 mg Rectal Daily   Or   aspirin  325 mg Oral Daily   feeding supplement  237 mL Oral TID BM   FLUoxetine  20 mg Oral Daily   heparin  5,000 Units Subcutaneous Q8H   lisinopril  20 mg Oral Daily   multivitamin with minerals  1 tablet Oral Daily   OLANZapine  5 mg Oral QPM   rosuvastatin  10 mg Oral Daily   Continuous Infusions:  sodium chloride       LOS: 14 days   Time spent:  Azucena Fallen, DO [facility]  If 7PM-7AM, please contact night-coverage [email or web address]  12/22/2021, 7:46 AM

## 2021-12-23 DIAGNOSIS — G9341 Metabolic encephalopathy: Secondary | ICD-10-CM | POA: Diagnosis not present

## 2021-12-23 MED ORDER — LORAZEPAM 2 MG/ML IJ SOLN: 0.5000 mg | Freq: Three times a day (TID) | INTRAMUSCULAR | Status: DC | PRN

## 2021-12-23 MED ORDER — OLANZAPINE 2.5 MG PO TABS
10.0000 mg | ORAL_TABLET | Freq: Every evening | ORAL | Status: DC
  Administered 2021-12-23 – 2021-12-26 (×4): 10 mg via ORAL
  Filled 2021-12-23 (×4): qty 4

## 2021-12-23 MED ORDER — HALOPERIDOL LACTATE 5 MG/ML IJ SOLN
1.0000 mg | Freq: Four times a day (QID) | INTRAMUSCULAR | Status: DC | PRN
  Administered 2021-12-23: 1 mg via INTRAVENOUS
  Filled 2021-12-23: qty 1

## 2021-12-23 NOTE — TOC Progression Note (Addendum)
Transition of Care [facility]) - Progression Note    Patient Details  Name: Deborah [name] MRN: [id number] Date of Birth: Oct 21, 1945  Transition of Care [facility]) CM/SW Contact  Carley Hammed, [place] Phone Number: 12/23/2021, 10:49 AM  Clinical Narrative:    1:15 CSW followed back up with Niece and advised her that PT had seen pt again and is recommending HH. Niece stating that she is working on getting her tickets together and will be back to get pt in a day or two. CSW updated MD. TOC will continue to follow.  CSW was notified by MD that pt is stating she has family near by, MD noting a home w/ HH DC would be appropriate. CSW spoke with Niece who is in [place]. She states all of pt's siblings have died except for one on hospice and one in a nursing home. She stated she was planning to come get pt and take her back to [place] by bus, but was under the impression pt would need SNF beforehand. CSW noted with her improvements, we will need to wait for PT to see what their recommendation is. PT changed recommendation to [facility] and MD notified, and requested he reach out to family to follow up. CSW will continue to follow for DC needs.   Expected Discharge Plan: Skilled Nursing Facility Barriers to Discharge: Continued Medical Work up  Expected Discharge Plan and Services Expected Discharge Plan: Skilled Nursing Facility In-house Referral: Clinical Social Work Discharge Planning Services: CM Consult   Living arrangements for the past 2 months: Single Family Home                                       Social Determinants of Health (SDOH) Interventions    Readmission Risk Interventions     No data to display

## 2021-12-23 NOTE — Progress Notes (Signed)
HOSPITAL MEDICINE OVERNIGHT EVENT NOTE    Notified by nursing that patient has continued to exhibit significant confusion.  Patient is regularly attempting to get out of bed, is not responding to frequent verbal redirection by nursing and is regularly pulling at medical devices.  As a result patient is placing himself at significant risk of injury.  Unfortunately there continues to be no sitter available.  We will renew previous order for restraints for patient's safety.  Continue to monitor patient closely.  Restraints will be discontinued as soon as able.  Marinda Elk  MD [facility]

## 2021-12-23 NOTE — Progress Notes (Signed)
Pt continues to be confused and not easy to redirect. Pulled off IV on previous shift and has not been agreeable to getting a new one placed. Continues to attempt getting out of bed. Keeps removing lab belt restraint. Bed alarm in use. Staff constantly in the room to redirect. Has been pleasantly confused during all this. Food offered and eaten well. Will continue plan of care.

## 2021-12-23 NOTE — Plan of Care (Signed)
  Problem: Safety: Goal: Non-violent Restraint(s) Outcome: Progressing   

## 2021-12-23 NOTE — Progress Notes (Signed)
PROGRESS NOTE    Deborah [name]  WSF:[id number] DOB: [birth year] DOA: 12/08/2021 PCP: Pcp, No   Brief Narrative:  Ms. Pascale is a 76 yo female with PMH anxiety, asthma, cocaine use, history of CVA, HTN, tobacco use, alcohol use who was brought to the hospital with altered mentation and found being down at a bus stop. Urine drug screen was recently positive for cocaine and benzos.  Initial CT head was concerning for PRES syndrome; she underwent MRI brain which showed acute infarct involving left thalamus and ruled out PRES. Due to ongoing altered mentation, she also underwent EEG which ruled out underlying seizure activity.  Patient's mental status continues to wax/wane over the last 72 hours but generally and slowly improving. Patient remains cleared for disposition from a medical standpoint but given her mental status, behavioral outbursts/episodes she remains high risk for dispo without around the clock care. Family traveling down to evaluate patient and hopefully assist with placement planning. Gershon Cull (Niece) is planning to fly down this week and take patient back home with family to [place]. Patient medically improving - should be safe to discharge home with family in the next 24-48h.  Assessment & Plan:   Principal Problem:   Acute metabolic encephalopathy Active Problems:   Cerebrovascular accident (CVA) of left thalamus (HCC)   Essential hypertension   Substance abuse (HCC)   Mood disorder (HCC)   Protein-calorie malnutrition, severe  Acute metabolic encephalopathy, ongoing, minimally improving - seizure activity ruled out per neurology and with EEG - etiology presumed multifactorial from substance use, hypertensive encephalopathy, and CVA - continue PRN ativan or haldol for agitation - continue to titrate meds as below and decrease IV benzos over the next 48h. - Patient's mental status stabilizing - much more appropriate over the past 24h   Cerebrovascular accident (CVA) of left  thalamus (HCC) - MRI on admission shows acute infarct involving left thalamus - Counseled on drug use cessation - Continue aspirin - Neurology evaluated    Essential hypertension - Continue amlodipine, lisinopril  Mood disorder (HCC) -Mental status and mood generally improving. -Patient's mood disorder is likely secondary to profound confusion in the setting of cocaine use and subsequent stroke -Continue to titrate Zyprexa dose -currently on 10 mg -We will discontinue Ativan, continue Haldol as needed only for combative/safety events, otherwise restraints have been quite useful   Substance abuse (HCC) - UDS noted with cocaine on admission - TOC consult placed  DVT prophylaxis: Heparin Code Status: Full Family Communication: None present; voicemail left for niece  Status is: Inpatient  Dispo: The patient is from: Home              Anticipated d/c is to: To be determined              Anticipated d/c date is: 48 to 72 hours              Patient currently IS medically stable for discharge -family to fly down from [place] to pick the patient up and return her home with family in the next few days  Consultants:  Neurology  Procedures:  None  Antimicrobials:  None  Subjective: No acute issues or events overnight denies nausea vomiting diarrhea constipation headache fevers chills or chest pain.  Much more awake alert oriented today  Objective: Vitals:   12/22/21 0350 12/22/21 0738 12/22/21 1113 12/22/21 2030  BP: (!) 141/68 118/63 (!) 115/59 120/77  Pulse: 67 64 68 70  Resp: 20 18 18 16   Temp:  98.4 F (36.9 C) 98.3 F (36.8 C) 98.3 F (36.8 C) 98.1 F (36.7 C)  TempSrc:    Oral  SpO2: 99% 100% 100% 100%  Weight:      Height:       No intake or output data in the 24 hours ending 12/23/21 0735  Filed Weights   12/09/21 0500 12/10/21 0500 12/11/21 0500  Weight: 56.2 kg 55.2 kg 52.2 kg    Examination:  General:  Pleasantly resting in bed, No acute  distress. HEENT:  Normocephalic atraumatic.  Sclerae nonicteric, noninjected.  Extraocular movements intact bilaterally. Neck:  Without mass or deformity.  Trachea is midline. Lungs:  Clear to auscultate bilaterally without rhonchi, wheeze, or rales. Heart:  Regular rate and rhythm.  Without murmurs, rubs, or gallops. Abdomen:  Soft, nontender, nondistended.  Without guarding or rebound. Extremities: Without cyanosis, clubbing, edema, or obvious deformity. Vascular:  Dorsalis pedis and posterior tibial pulses palpable bilaterally. Skin:  Warm and dry, no erythema, no ulcerations.  Data Reviewed: I have personally reviewed following labs and imaging studies  CBC: No results for input(s): "WBC", "NEUTROABS", "HGB", "HCT", "MCV", "PLT" in the last 168 hours.  Basic Metabolic Panel: No results for input(s): "NA", "K", "CL", "CO2", "GLUCOSE", "BUN", "CREATININE", "CALCIUM", "MG", "PHOS" in the last 168 hours.  GFR: Estimated Creatinine Clearance: 36.6 mL/min (A) (by C-G formula based on SCr of 1.06 mg/dL (H)). Liver Function Tests: No results for input(s): "AST", "ALT", "ALKPHOS", "BILITOT", "PROT", "ALBUMIN" in the last 168 hours. No results for input(s): "LIPASE", "AMYLASE" in the last 168 hours. No results for input(s): "AMMONIA" in the last 168 hours. Coagulation Profile: No results for input(s): "INR", "PROTIME" in the last 168 hours. Cardiac Enzymes: No results for input(s): "CKTOTAL", "CKMB", "CKMBINDEX", "TROPONINI" in the last 168 hours. BNP (last 3 results) No results for input(s): "PROBNP" in the last 8760 hours. HbA1C: No results for input(s): "HGBA1C" in the last 72 hours. CBG: Recent Labs  Lab 12/22/21 1554  GLUCAP 125*    Lipid Profile: No results for input(s): "CHOL", "HDL", "LDLCALC", "TRIG", "CHOLHDL", "LDLDIRECT" in the last 72 hours. Thyroid Function Tests: No results for input(s): "TSH", "T4TOTAL", "FREET4", "T3FREE", "THYROIDAB" in the last 72 hours. Anemia  Panel: No results for input(s): "VITAMINB12", "FOLATE", "FERRITIN", "TIBC", "IRON", "RETICCTPCT" in the last 72 hours. Sepsis Labs: No results for input(s): "PROCALCITON", "LATICACIDVEN" in the last 168 hours.  No results found for this or any previous visit (from the past 240 hour(s)).   Radiology Studies: No results found.  Scheduled Meds:  amLODipine  5 mg Oral Daily   aspirin  300 mg Rectal Daily   Or   aspirin  325 mg Oral Daily   feeding supplement  237 mL Oral TID BM   FLUoxetine  20 mg Oral Daily   heparin  5,000 Units Subcutaneous Q8H   lisinopril  20 mg Oral Daily   multivitamin with minerals  1 tablet Oral Daily   OLANZapine  5 mg Oral QPM   rosuvastatin  10 mg Oral Daily   Continuous Infusions:  sodium chloride       LOS: 15 days   Time spent:  Azucena Fallen, DO [facility]  If 7PM-7AM, please contact night-coverage [email or web address]  12/23/2021, 7:35 AM

## 2021-12-23 NOTE — Progress Notes (Signed)
Physical Therapy Treatment Patient Details Name: Deborah [name] MRN: [id number] DOB: [birth year] Today's Date: 12/23/2021   History of Present Illness Pt is a 76 y.o. F who was found down at the bus stop and admitted 12/08/2021. MRI showing small left thalamic stroke. Significant PMH: alcohol and cocaine abuse, CVA, HTN.    PT Comments    Pt progressing towards physical therapy goals. Was able to perform transfers and ambulation with gross min-mod assist and HHA instead of an AD. When PT entered room, pt soiled with spilled coffee and copious amounts of urine in bed. Focus of session was ADL tasks, pt washing up at the sink and room sanitation/linen change. Pt did well with sink level bath, requiring occasional min assist in sitting, and mod assist when attempting to stand and perform peri-care. Pt followed commands well and was pleasant throughout session. Discharge plan updated to reflect current plan for home with family support as discussed with MD prior to session beginning.    Recommendations for follow up therapy are one component of a multi-disciplinary discharge planning process, led by the attending physician.  Recommendations may be updated based on patient status, additional functional criteria and insurance authorization.  Follow Up Recommendations  Home health PT Can patient physically be transported by private vehicle: Yes   Assistance Recommended at Discharge Frequent or constant Supervision/Assistance  Patient can return home with the following A little help with walking and/or transfers;A little help with bathing/dressing/bathroom;Assistance with cooking/housework;Direct supervision/assist for medications management;Assist for transportation;Help with stairs or ramp for entrance   Equipment Recommendations  None recommended by PT    Recommendations for Other Services       Precautions / Restrictions Precautions Precautions: Fall Precaution Comments: posey  belt Restrictions Weight Bearing Restrictions: No     Mobility  Bed Mobility Overal bed mobility: Needs Assistance Bed Mobility: Supine to Sit, Sit to Supine     Supine to sit: Min assist, HOB elevated Sit to supine: Min guard   General bed mobility comments: Trunk support as pt transitioned to EOB. Min assist due to posterior lean.    Transfers Overall transfer level: Needs assistance Equipment used: 1 person hand held assist Transfers: Sit to/from Stand Sit to Stand: Min assist, Mod assist           General transfer comment: Mod assist initially from EOB, and min assist from chair with arm rests. Hands on assist required throughout transfers. VC's for hand placement on seated surface for safety.    Ambulation/Gait Ambulation/Gait assistance: Min assist, Mod assist Gait Distance (Feet): 25 Feet Assistive device: 1 person hand held assist Gait Pattern/deviations: Step-through pattern, Decreased stride length, Staggering left, Staggering right Gait velocity: Decreased Gait velocity interpretation: <1.8 ft/sec, indicate of risk for recurrent falls   General Gait Details: Ambulation around room. Min-mod assist required for balance support and safety.   Stairs             Wheelchair Mobility    Modified Rankin (Stroke Patients Only) Modified Rankin (Stroke Patients Only) Pre-Morbid Rankin Score: No symptoms Modified Rankin: Moderately severe disability     Balance Overall balance assessment: Needs assistance Sitting-balance support: No upper extremity supported, Feet supported Sitting balance-Leahy Scale: Fair     Standing balance support: No upper extremity supported, Bilateral upper extremity supported, During functional activity Standing balance-Leahy Scale: Poor Standing balance comment: relies on external support  Cognition Arousal/Alertness: Awake/alert Behavior During Therapy: Restless, Impulsive Overall  Cognitive Status: Impaired/Different from baseline Area of Impairment: Orientation, Attention, Memory, Following commands, Safety/judgement, Awareness, Problem solving                 Orientation Level: Disoriented to, Time, Situation Current Attention Level: Focused Memory: Decreased short-term memory, Decreased recall of precautions Following Commands: Follows one step commands inconsistently, Follows one step commands with increased time Safety/Judgement: Decreased awareness of safety, Decreased awareness of deficits Awareness: Intellectual Problem Solving: Slow processing, Decreased initiation, Difficulty sequencing, Requires tactile cues, Requires verbal cues General Comments: Pleasantly confused throughout session.  following approx 75% of commands with increased time initially and no increased time once pt began washing up at the sink..        Exercises Other Exercises Other Exercises: ROM BUE horizontal abduction/adduction in supine x5 on the L, x10 on the R    General Comments        Pertinent Vitals/Pain Pain Assessment Pain Assessment: Faces Faces Pain Scale: No hurt    Home Living                          Prior Function            PT Goals (current goals can now be found in the care plan section) Acute Rehab PT Goals Patient Stated Goal: Go home with her sister PT Goal Formulation: With patient Time For Goal Achievement: 12/23/21 Potential to Achieve Goals: Fair Progress towards PT goals: Progressing toward goals    Frequency    Min 3X/week      PT Plan Discharge plan needs to be updated    Co-evaluation              AM-PAC PT "6 Clicks" Mobility   Outcome Measure  Help needed turning from your back to your side while in a flat bed without using bedrails?: A Little Help needed moving from lying on your back to sitting on the side of a flat bed without using bedrails?: A Little Help needed moving to and from a bed to a chair  (including a wheelchair)?: A Lot Help needed standing up from a chair using your arms (e.g., wheelchair or bedside chair)?: A Lot Help needed to walk in hospital room?: A Lot Help needed climbing 3-5 steps with a railing? : A Lot 6 Click Score: 14    End of Session Equipment Utilized During Treatment: Gait belt Activity Tolerance: Patient tolerated treatment well;Patient limited by lethargy Patient left: in bed;with call bell/phone within reach;with restraints reapplied;with bed alarm set;with family/visitor present Nurse Communication: Mobility status PT Visit Diagnosis: Other abnormalities of gait and mobility (R26.89);Other symptoms and signs involving the nervous system (R29.898)     Time: 2694-8546 PT Time Calculation (min) (ACUTE ONLY): 40 min  Charges:  $Gait Training: 23-37 mins $Self Care/Home Management: 8-22                     Conni Slipper, PT, DPT Acute Rehabilitation Services Secure Chat Preferred Office: [phone]    Marylynn Pearson 12/23/2021, 10:34 AM

## 2021-12-24 DIAGNOSIS — G9341 Metabolic encephalopathy: Secondary | ICD-10-CM | POA: Diagnosis not present

## 2021-12-24 MED ORDER — LORAZEPAM 0.5 MG PO TABS
0.5000 mg | ORAL_TABLET | Freq: Four times a day (QID) | ORAL | Status: DC | PRN
  Administered 2021-12-24 – 2021-12-26 (×4): 0.5 mg via ORAL
  Filled 2021-12-24 (×4): qty 1

## 2021-12-24 MED ORDER — HALOPERIDOL LACTATE 5 MG/ML IJ SOLN
1.0000 mg | Freq: Four times a day (QID) | INTRAMUSCULAR | Status: DC | PRN
  Administered 2021-12-24: 1 mg via INTRAMUSCULAR
  Filled 2021-12-24 (×2): qty 1

## 2021-12-24 MED ORDER — NICOTINE 21 MG/24HR TD PT24
21.0000 mg | MEDICATED_PATCH | Freq: Every day | TRANSDERMAL | Status: DC
  Administered 2021-12-24 – 2021-12-27 (×4): 21 mg via TRANSDERMAL
  Filled 2021-12-24 (×4): qty 1

## 2021-12-24 NOTE — Plan of Care (Addendum)
Patient able to come out of bilateral arm restraints. Posey belt still needed for safety plan. Problem: Safety: Goal: Non-violent Restraint(s) Outcome: Progressing

## 2021-12-24 NOTE — Progress Notes (Signed)
PROGRESS NOTE    Deborah [name]  TKW:[id number] DOB: [birth year] DOA: 12/08/2021 PCP: Pcp, No   Brief Narrative:  Deborah [name] is a 76 yo female with PMH anxiety, asthma, cocaine use, history of CVA, HTN, tobacco use, alcohol use who was brought to the hospital with altered mentation and found being down at a bus stop. Urine drug screen was recently positive for cocaine and benzos.  Initial CT head was concerning for PRES syndrome; she underwent MRI brain which showed acute infarct involving left thalamus and ruled out PRES. Due to ongoing altered mentation, she also underwent EEG which ruled out underlying seizure activity.  Patient's mental status continues to wax/wane over the last 72 hours but generally and slowly improving. Patient remains cleared for disposition from a medical standpoint but given her mental status, behavioral outbursts/episodes she remains high risk for dispo without around the clock care. Family traveling down to evaluate patient and hopefully assist with placement planning. Deborah [name] (Niece) is planning to fly down this week and take patient back home with family to [place]. Patient medically improving - should be safe to discharge home with family in the next 24-48h.  Assessment & Plan:   Principal Problem:   Acute metabolic encephalopathy Active Problems:   Cerebrovascular accident (CVA) of left thalamus (HCC)   Essential hypertension   Substance abuse (HCC)   Mood disorder (HCC)   Protein-calorie malnutrition, severe  Acute metabolic encephalopathy, ongoing, minimally improving - seizure activity ruled out per neurology and with EEG - etiology presumed multifactorial from substance use, hypertensive encephalopathy, and CVA - continue PRN ativan or haldol for agitation - continue to titrate meds as below and no haldol in >24h - Patient's mental status stabilizing - much more appropriate over the past 24h but remains labile at points (attempting to leave the floor  yesterday and poorly redirectable).   Cerebrovascular accident (CVA) of left thalamus (HCC) - MRI on admission shows acute infarct involving left thalamus - Counseled on drug use cessation - Continue aspirin - Neurology evaluated    Essential hypertension - Continue amlodipine, lisinopril  Mood disorder (HCC) -Mental status and mood generally improving. -Patient's mood disorder is likely secondary to profound confusion in the setting of cocaine use and subsequent stroke -Continue to titrate Zyprexa dose -currently on 10 mg -We will discontinue Ativan, continue Haldol as needed only for combative/safety events, otherwise restraints have been quite useful   Substance abuse (HCC) - UDS noted with cocaine on admission - TOC consult placed  DVT prophylaxis: Heparin Code Status: Full Family Communication: None present; voicemail left for niece  Status is: Inpatient  Dispo: The patient is from: Home              Anticipated d/c is to: To be determined              Anticipated d/c date is: 48 to 72 hours              Patient currently IS medically stable for discharge -family to fly down from [place] to pick the patient up and return her home with family in the next few days  Consultants:  Neurology  Procedures:  None  Antimicrobials:  None  Subjective: No acute issues or events overnight denies nausea vomiting diarrhea constipation headache fevers chills or chest pain. Much more awake alert oriented today -excited her family is coming to pick her up.  Objective: Vitals:   12/23/21 0833 12/23/21 1256 12/23/21 1617 12/23/21 2321  BP: Marland Kitchen)  146/80 (!) 151/87 128/69 121/66  Pulse: 73 (!) 57 72 66  Resp: 17 16 17 16   Temp: 98 F (36.7 C) 97.9 F (36.6 C) 97.8 F (36.6 C) 98.1 F (36.7 C)  TempSrc: Oral Oral Oral Oral  SpO2: 100% 100% 100% 100%  Weight:      Height:        Intake/Output Summary (Last 24 hours) at 12/24/2021 0729 Last data filed at 12/24/2021 0308 Gross  per 24 hour  Intake 1320 ml  Output 200 ml  Net 1120 ml    Filed Weights   12/09/21 0500 12/10/21 0500 12/11/21 0500  Weight: 56.2 kg 55.2 kg 52.2 kg    Examination:  General:  Pleasantly resting in bed, No acute distress. HEENT:  Normocephalic atraumatic.  Sclerae nonicteric, noninjected.  Extraocular movements intact bilaterally. Neck:  Without mass or deformity.  Trachea is midline. Lungs:  Clear to auscultate bilaterally without rhonchi, wheeze, or rales. Heart:  Regular rate and rhythm.  Without murmurs, rubs, or gallops. Abdomen:  Soft, nontender, nondistended.  Without guarding or rebound. Extremities: Without cyanosis, clubbing, edema, or obvious deformity. Vascular:  Dorsalis pedis and posterior tibial pulses palpable bilaterally. Skin:  Warm and dry, no erythema, no ulcerations.  Data Reviewed: I have personally reviewed following labs and imaging studies  CBC: No results for input(s): "WBC", "NEUTROABS", "HGB", "HCT", "MCV", "PLT" in the last 168 hours.  Basic Metabolic Panel: No results for input(s): "NA", "K", "CL", "CO2", "GLUCOSE", "BUN", "CREATININE", "CALCIUM", "MG", "PHOS" in the last 168 hours.  GFR: Estimated Creatinine Clearance: 36.6 mL/min (A) (by C-G formula based on SCr of 1.06 mg/dL (H)). Liver Function Tests: No results for input(s): "AST", "ALT", "ALKPHOS", "BILITOT", "PROT", "ALBUMIN" in the last 168 hours. No results for input(s): "LIPASE", "AMYLASE" in the last 168 hours. No results for input(s): "AMMONIA" in the last 168 hours. Coagulation Profile: No results for input(s): "INR", "PROTIME" in the last 168 hours. Cardiac Enzymes: No results for input(s): "CKTOTAL", "CKMB", "CKMBINDEX", "TROPONINI" in the last 168 hours. BNP (last 3 results) No results for input(s): "PROBNP" in the last 8760 hours. HbA1C: No results for input(s): "HGBA1C" in the last 72 hours. CBG: Recent Labs  Lab 12/22/21 1554  GLUCAP 125*    Lipid Profile: No  results for input(s): "CHOL", "HDL", "LDLCALC", "TRIG", "CHOLHDL", "LDLDIRECT" in the last 72 hours. Thyroid Function Tests: No results for input(s): "TSH", "T4TOTAL", "FREET4", "T3FREE", "THYROIDAB" in the last 72 hours. Anemia Panel: No results for input(s): "VITAMINB12", "FOLATE", "FERRITIN", "TIBC", "IRON", "RETICCTPCT" in the last 72 hours. Sepsis Labs: No results for input(s): "PROCALCITON", "LATICACIDVEN" in the last 168 hours.  No results found for this or any previous visit (from the past 240 hour(s)).   Radiology Studies: No results found.  Scheduled Meds:  amLODipine  5 mg Oral Daily   aspirin  300 mg Rectal Daily   Or   aspirin  325 mg Oral Daily   feeding supplement  237 mL Oral TID BM   FLUoxetine  20 mg Oral Daily   heparin  5,000 Units Subcutaneous Q8H   lisinopril  20 mg Oral Daily   multivitamin with minerals  1 tablet Oral Daily   OLANZapine  10 mg Oral QPM   rosuvastatin  10 mg Oral Daily   Continuous Infusions:  sodium chloride       LOS: 16 days   Time spent: 02/22/22  , DO [facility]  If 7PM-7AM, please contact night-coverage [email or web address]  12/24/2021,  7:29 AM

## 2021-12-24 NOTE — Progress Notes (Addendum)
HOSPITAL MEDICINE OVERNIGHT EVENT NOTE    Notified by nursing that despite usage of soft restraint belt patient continues to be agitated, not following commands, regularly pulling out IV and placing himself at risk of fall.  Unfortunately due to the fact the patient has lost his IV patient has not received his as needed doses of Haldol or Ativan.  We will switch Haldol to IM and we will switch Ativan to p.o.  Nursing to administer these to attempt to relieve patient's agitation.  If this is unsuccessful we will proceed with adding soft wrist restraints to the Posey belt.  Deborah Elk  MD [facility]   ADDENDUM (7/4 11:50pm)  Patient continues to be quite agitated with frequent attempts to get out of bed this to himself at risk of injury.  Doses of Haldol and Ativan have not been successful in addressing patient's agitation.  We will add and place an order for two-point soft wrist restraints and monitor closely.  Deborah [name]

## 2021-12-25 DIAGNOSIS — G9341 Metabolic encephalopathy: Secondary | ICD-10-CM | POA: Diagnosis not present

## 2021-12-25 DIAGNOSIS — F39 Unspecified mood [affective] disorder: Secondary | ICD-10-CM | POA: Diagnosis not present

## 2021-12-25 DIAGNOSIS — E43 Unspecified severe protein-calorie malnutrition: Secondary | ICD-10-CM

## 2021-12-25 DIAGNOSIS — F1910 Other psychoactive substance abuse, uncomplicated: Secondary | ICD-10-CM

## 2021-12-25 DIAGNOSIS — I10 Essential (primary) hypertension: Secondary | ICD-10-CM | POA: Diagnosis not present

## 2021-12-25 DIAGNOSIS — I6381 Other cerebral infarction due to occlusion or stenosis of small artery: Secondary | ICD-10-CM | POA: Diagnosis not present

## 2021-12-25 NOTE — Progress Notes (Signed)
Patient confused, difficult to reorient, restless, agitated, hallucinating a man walking in front of her in pt's room, pulling on bed linens/lines/tubes, and jumping out of bed. Ativan and haldol given to no effect. Safety sitter at bedside.

## 2021-12-25 NOTE — TOC Progression Note (Addendum)
Transition of Care [facility]) - Progression Note    Patient Details  Name: Deborah [name] MRN: [id number] Date of Birth: 02/22/1946  Transition of Care [facility]) CM/SW Contact  Carley Hammed, [place] Phone Number: 12/25/2021, 10:41 AM  Clinical Narrative:    CSW attempted to follow up with pt's Niece, Gershon Cull, to get updates on DC plan. VM left for [facility] requesting a call back, TOC will continue to follow.  2:15 CSW received a return call from Niece stating she is now unable to come get pt. Her brother has just had a stroke, and she has to care for him and her autistic son. She notes she would like for pt to be placed for the time being, and she will come get her whenever she is able. CSW noted pt had been followed by [facility] and left a VM requesting they review again. MD notified of change in disposition. TOC will continue to follow for placement needs. Expected Discharge Plan: Skilled Nursing Facility Barriers to Discharge: Continued Medical Work up  Expected Discharge Plan and Services Expected Discharge Plan: Skilled Nursing Facility In-house Referral: Clinical Social Work Discharge Planning Services: CM Consult   Living arrangements for the past 2 months: Single Family Home                                       Social Determinants of Health (SDOH) Interventions    Readmission Risk Interventions     No data to display

## 2021-12-25 NOTE — Progress Notes (Signed)
Physical Therapy Treatment Patient Details Name: Deborah [name] MRN: [id number] DOB: [birth year] Today's Date: 12/25/2021   History of Present Illness Pt is a 76 y.o. F who [place] and admitted 12/08/2021. MRI showing small left thalamic stroke. Significant PMH: alcohol and cocaine abuse, CVA, HTN.    PT Comments    Pt supine in bed on arrival this session sleeping.  She remains confused and only oriented to her self.  Balance remains impaired although better than it has been.  Trialed stairs today with poor safety.     Recommendations for follow up therapy are one component of a multi-disciplinary discharge planning process, led by the attending physician.  Recommendations may be updated based on patient status, additional functional criteria and insurance authorization.  Follow Up Recommendations  Home health PT     Assistance Recommended at Discharge Frequent or constant Supervision/Assistance  Patient can return home with the following A little help with walking and/or transfers;A little help with bathing/dressing/bathroom;Assistance with cooking/housework;Direct supervision/assist for medications management;Assist for transportation;Help with stairs or ramp for entrance   Equipment Recommendations  None recommended by PT    Recommendations for Other Services       Precautions / Restrictions Precautions Precautions: Fall Restrictions Weight Bearing Restrictions: No     Mobility  Bed Mobility Overal bed mobility: Needs Assistance Bed Mobility: Supine to Sit, Sit to Supine     Supine to sit: Min assist, HOB elevated Sit to supine: Min guard   General bed mobility comments: trunk support to ascend    Transfers Overall transfer level: Needs assistance Equipment used: 1 person hand held assist Transfers: Sit to/from Stand Sit to Stand: Min assist, +2 safety/equipment           General transfer comment: for safety, power up and steadying     Ambulation/Gait Ambulation/Gait assistance: Min assist, Mod assist, +2 safety/equipment Gait Distance (Feet): 180 Feet Assistive device: 1 person hand held assist Gait Pattern/deviations: Step-through pattern, Decreased stride length, Staggering left, Staggering right Gait velocity: Decreased     General Gait Details: Increased ambulation with mild staggering,  Required decreased assistance as trial progressed.  Assist level varried this session based on behavior.   Stairs Stairs: Yes Stairs assistance: Min assist Stair Management: Two rails, Forwards Number of Stairs: 5 General stair comments: Pt with poor safety and balance skipping stairs and having difficulty with depth perception.   Wheelchair Mobility    Modified Rankin (Stroke Patients Only) Modified Rankin (Stroke Patients Only) Pre-Morbid Rankin Score: No symptoms Modified Rankin: Moderately severe disability     Balance Overall balance assessment: Needs assistance Sitting-balance support: No upper extremity supported, Feet supported Sitting balance-Leahy Scale: Fair       Standing balance-Leahy Scale: Poor Standing balance comment: relies on external support                            Cognition Arousal/Alertness: Awake/alert Behavior During Therapy: Impulsive Overall Cognitive Status: Impaired/Different from baseline Area of Impairment: Orientation, Attention, Memory, Following commands, Safety/judgement, Awareness, Problem solving                 Orientation Level: Disoriented to, Time, Situation, Place Current Attention Level: Focused Memory: Decreased short-term memory, Decreased recall of precautions Following Commands: Follows one step commands inconsistently, Follows one step commands with increased time Safety/Judgement: Decreased awareness of safety, Decreased awareness of deficits Awareness: Intellectual Problem Solving: Slow processing, Decreased initiation, Difficulty  sequencing, Requires tactile cues, Requires verbal cues General Comments: pt pleasantly confused, following simple 1 step commands with increased time but demonstrating poor awareness, problem sovling and sequencing during basic ADL tasks.  Max cueing to problem solve direction of numbers to return to her room.pt reports therapists are her aunt and niece.        Exercises      General Comments        Pertinent Vitals/Pain      Home Living                          Prior Function            PT Goals (current goals can now be found in the care plan section) Acute Rehab PT Goals Patient Stated Goal: Go home with her sister Time For Goal Achievement: 01/08/22 Potential to Achieve Goals: Fair Progress towards PT goals: Progressing toward goals    Frequency    Min 3X/week      PT Plan Current plan remains appropriate    Co-evaluation   Reason for Co-Treatment: For patient/therapist safety PT goals addressed during session: Mobility/safety with mobility OT goals addressed during session: ADL's and self-care      AM-PAC PT "6 Clicks" Mobility   Outcome Measure  Help needed turning from your back to your side while in a flat bed without using bedrails?: A Little Help needed moving from lying on your back to sitting on the side of a flat bed without using bedrails?: A Little Help needed moving to and from a bed to a chair (including a wheelchair)?: A Lot Help needed standing up from a chair using your arms (e.g., wheelchair or bedside chair)?: A Lot Help needed to walk in hospital room?: A Lot Help needed climbing 3-5 steps with a railing? : A Lot 6 Click Score: 14    End of Session Equipment Utilized During Treatment: Gait belt Activity Tolerance: Patient tolerated treatment well;Patient limited by lethargy Patient left: in bed;with call bell/phone within reach;with bed alarm set;with family/visitor present Nurse Communication: Mobility status PT Visit  Diagnosis: Other abnormalities of gait and mobility (R26.89);Other symptoms and signs involving the nervous system (R29.898)     Time: 4431-5400 PT Time Calculation (min) (ACUTE ONLY): 24 min  Charges:  $Gait Training: 8-22 mins                     Bonney Leitz , PTA Acute Rehabilitation Services Office [phone]    Pavle Wiler Artis Delay 12/25/2021, 2:18 PM

## 2021-12-25 NOTE — Progress Notes (Signed)
Progress Note  Patient: Deborah [name] ZOX:[id number] DOB: 10-16-1945  DOA: 12/08/2021  DOS: 12/25/2021    Brief hospital course: Ms. Dominski is a 76 yo female with PMH anxiety, asthma, cocaine use, history of CVA, HTN, tobacco use, alcohol use who was brought to the hospital with altered mentation and found being down at a bus stop. Urine drug screen was recently positive for cocaine and benzos.  Initial CT head was concerning for PRES syndrome; she underwent MRI brain which showed acute infarct involving left thalamus and ruled out PRES. Due to ongoing altered mentation, she also underwent EEG which ruled out underlying seizure activity.   Patient's mental status continues to wax/wane but generally and slowly improving. Patient remains cleared for disposition from a medical standpoint but given her mental status, behavioral outbursts/episodes she requires disposition with constant supervision and assistance. The patient's only available family, her Niece, has been arranging transportation to bring the patient home with her. We will pursue alternative options as well.   Assessment and Plan: Acute metabolic encephalopathy, ongoing, minimally improving - seizure activity ruled out per neurology and with EEG - etiology presumed multifactorial from substance use, hypertensive encephalopathy, and CVA - continue PRN ativan or haldol for agitation - continue to titrate meds as below and no haldol in >24h - Patient's mental status stabilizing - much more appropriate over the past 24h but remains labile at points (attempting to leave the floor yesterday and poorly redirectable).   Cerebrovascular accident (CVA) of left thalamus (HCC) - Continue ASA, high intensity statin per neurology recommendations. LDL 75. HbA1c 5.5% - Follow up with neurology in 1-2 months.  - Counseled on drug use cessation   Essential hypertension - Continue amlodipine, lisinopril   Mood disorder: Primary vs. substance-induced.  -  Sitter strongly encouraged over physical restraints or sedation.  - Continue zyprexa, SSRI, prn lorazepam and haldol.    Substance abuse (HCC) - UDS noted with cocaine on admission - TOC consult placed  Pericardial effusion: No evidence of tamponade.  - Also with mention of LVH and elevated mid-cavitary gradient, could get repeat echocardiogram for further evaluation.   Severe protein-calorie malnutrition:  - Supplement protein as able.   Subjective: Pt had outbursts yesterday evening and pulled out IV, so got haldol IM for safety reasons. Today has a sitter at bedside and is much more calm and cooperative. No new complaints.   Objective: Vitals:   12/24/21 2000 12/24/21 2344 12/25/21 0858 12/25/21 1214  BP: 121/66 120/73 102/71 (!) 111/53  Pulse: 66 79 79 73  Resp: 17 18 18 14   Temp: 98.1 F (36.7 C) 98.2 F (36.8 C) 98.6 F (37 C) 97.9 F (36.6 C)  TempSrc: Oral Oral Oral Oral  SpO2: 100% 100% 100% 100%  Weight:      Height:       Gen: Chronically ill-appearing 76 y.o. female in no distress Pulm: Nonlabored breathing room air. Clear CV: Regular rate and rhythm. Soft early systolic murmur at base without other murmur, rub, or gallop. No JVD, no dependent edema. GI: Abdomen soft, non-tender, non-distended, with normoactive bowel sounds.  Ext: Warm, no deformities Skin: No rashes, lesions or ulcers on visualized skin. Neuro: Alert and incompletely oriented. Can stand unassisted, slightly off balance with that. Psych: Judgement and insight appear impaired  Data Personally reviewed: Echo 6/20 showing normal hyperdynamic LV systolic function with concern for hypertrophy. Also aortic valve not well visualized (limited study due to pt challenges with cooperation. +Pericardial effusion posterior and lateral  to LV with normal IVC and no mention of RV diastolic flattening.   Family Communication: None at bedside  Disposition: Status is: Inpatient Remains inpatient appropriate  because: Unsafe DC Planned Discharge Destination:  Home with close supervision. If unable to secure this, would require SNF level of care/supervision.  Tyrone Nine, MD 12/25/2021 2:29 PM Page by [email or web address]

## 2021-12-25 NOTE — Progress Notes (Signed)
Occupational Therapy Treatment Patient Details Name: Deborah [name] MRN: [id number] DOB: 1946/04/29 Today's Date: 12/25/2021   History of present illness Pt is a 76 y.o. F who was found down at the bus stop and admitted 12/08/2021. MRI showing small left thalamic stroke. Significant PMH: alcohol and cocaine abuse, CVA, HTN.   OT comments  Patient supine in bed, asleep but easily aroused. Completes transfers with min assist +2 safety, min assist fading to min guard +2 safety for mobility.  LB ADLs with min assist, toileting with mod assist and grooming with min assist but max cueing for sequencing due to cognition.  Physically demonstrating improving balance and strength, but remains limited by cognitive deficits. Continue to recommend SNF at this time.    Recommendations for follow up therapy are one component of a multi-disciplinary discharge planning process, led by the attending physician.  Recommendations may be updated based on patient status, additional functional criteria and insurance authorization.    Follow Up Recommendations  Skilled nursing-short term rehab (<3 hours/day)    Assistance Recommended at Discharge Frequent or constant Supervision/Assistance  Patient can return home with the following  A lot of help with walking and/or transfers;A lot of help with bathing/dressing/bathroom;Assistance with cooking/housework;Direct supervision/assist for medications management;Assist for transportation;Help with stairs or ramp for entrance   Equipment Recommendations  Other (comment) (defer)    Recommendations for Other Services      Precautions / Restrictions Precautions Precautions: Fall Restrictions Weight Bearing Restrictions: No       Mobility Bed Mobility Overal bed mobility: Needs Assistance Bed Mobility: Supine to Sit, Sit to Supine     Supine to sit: Min assist, HOB elevated Sit to supine: Min guard   General bed mobility comments: trunk support to ascend     Transfers Overall transfer level: Needs assistance   Transfers: Sit to/from Stand Sit to Stand: Min assist, +2 safety/equipment           General transfer comment: for safety, power up and steadying     Balance Overall balance assessment: Needs assistance Sitting-balance support: No upper extremity supported, Feet supported Sitting balance-Leahy Scale: Fair Sitting balance - Comments: min guard dynamically for safety   Standing balance support: No upper extremity supported, Single extremity supported, During functional activity Standing balance-Leahy Scale: Poor Standing balance comment: relies on external support                           ADL either performed or assessed with clinical judgement   ADL Overall ADL's : Needs assistance/impaired     Grooming: Minimal assistance;Wash/dry hands;Standing Grooming Details (indicate cue type and reason): max cueing for sequencing             Lower Body Dressing: Minimal assistance;+2 for safety/equipment Lower Body Dressing Details (indicate cue type and reason): donning socks, min assist +2 safety sit to stand Toilet Transfer: Minimal assistance;+2 for safety/equipment   Toileting- Clothing Manipulation and Hygiene: Moderate assistance;+2 for safety/equipment Toileting - Clothing Manipulation Details (indicate cue type and reason): cueing for safety, sequencing, and completion of tasks     Functional mobility during ADLs: Minimal assistance;+2 for safety/equipment;Min guard General ADL Comments: min assist +2 safety with hand held assist fading to min guard    Extremity/Trunk Assessment              Vision       Perception     Praxis      Cognition Arousal/Alertness: Awake/alert  Behavior During Therapy: Impulsive Overall Cognitive Status: Impaired/Different from baseline Area of Impairment: Orientation, Attention, Memory, Following commands, Safety/judgement, Awareness, Problem solving                  Orientation Level: Disoriented to, Time, Situation, Place Current Attention Level: Focused Memory: Decreased short-term memory, Decreased recall of precautions Following Commands: Follows one step commands inconsistently, Follows one step commands with increased time Safety/Judgement: Decreased awareness of safety, Decreased awareness of deficits Awareness: Intellectual Problem Solving: Slow processing, Decreased initiation, Difficulty sequencing, Requires tactile cues, Requires verbal cues General Comments: pt pleasantly confused, following simple 1 step commands with increased time but demonstrating poor awareness, problem sovling and sequencing during basic ADL tasks.  Max cueing to problem solve direction of numbers to return to her room.pt reports therapists are her aunt and niece.        Exercises      Shoulder Instructions       General Comments      Pertinent Vitals/ Pain       Pain Assessment Pain Assessment: Faces Faces Pain Scale: No hurt Pain Intervention(s): Monitored during session  Home Living                                          Prior Functioning/Environment              Frequency  Min 2X/week        Progress Toward Goals  OT Goals(current goals can now be found in the care plan section)  Progress towards OT goals: Goals met and updated - see care plan  Acute Rehab OT Goals Patient Stated Goal: none s tated OT Goal Formulation: With patient Time For Goal Achievement: 01/08/22 Potential to Achieve Goals: Good  Plan Discharge plan remains appropriate;Frequency remains appropriate    Co-evaluation    PT/OT/SLP Co-Evaluation/Treatment: Yes Reason for Co-Treatment: Necessary to address cognition/behavior during functional activity   OT goals addressed during session: ADL's and self-care      AM-PAC OT "6 Clicks" Daily Activity     Outcome Measure   Help from another person eating meals?: A  Little Help from another person taking care of personal grooming?: A Lot Help from another person toileting, which includes using toliet, bedpan, or urinal?: A Lot Help from another person bathing (including washing, rinsing, drying)?: A Lot Help from another person to put on and taking off regular upper body clothing?: A Lot Help from another person to put on and taking off regular lower body clothing?: A Lot 6 Click Score: 13    End of Session Equipment Utilized During Treatment: Gait belt  OT Visit Diagnosis: Unsteadiness on feet (R26.81);Other abnormalities of gait and mobility (R26.89);Muscle weakness (generalized) (M62.81)   Activity Tolerance Patient tolerated treatment well   Patient Left in bed;with call bell/phone within reach;with bed alarm set;with nursing/sitter in room   Nurse Communication Mobility status        Time: 3009-2330 OT Time Calculation (min): 23 min  Charges: OT General Charges $OT Visit: 1 Visit OT Treatments $Self Care/Home Management : 8-22 mins  Jolaine Artist, OT Acute Rehabilitation Services Office [phone]   Delight Stare 12/25/2021, 1:25 PM

## 2021-12-26 DIAGNOSIS — G9341 Metabolic encephalopathy: Secondary | ICD-10-CM | POA: Diagnosis not present

## 2021-12-26 DIAGNOSIS — F39 Unspecified mood [affective] disorder: Secondary | ICD-10-CM | POA: Diagnosis not present

## 2021-12-26 DIAGNOSIS — I6381 Other cerebral infarction due to occlusion or stenosis of small artery: Secondary | ICD-10-CM | POA: Diagnosis not present

## 2021-12-26 DIAGNOSIS — I10 Essential (primary) hypertension: Secondary | ICD-10-CM | POA: Diagnosis not present

## 2021-12-26 LAB — CBC
HCT: 33.9 % — ABNORMAL LOW (ref 36.0–46.0)
Hemoglobin: 11.2 g/dL — ABNORMAL LOW (ref 12.0–15.0)
MCH: 31.6 pg (ref 26.0–34.0)
MCHC: 33 g/dL (ref 30.0–36.0)
MCV: 95.8 fL (ref 80.0–100.0)
Platelets: 351 10*3/uL (ref 150–400)
RBC: 3.54 MIL/uL — ABNORMAL LOW (ref 3.87–5.11)
RDW: 13.6 % (ref 11.5–15.5)
WBC: 6.8 10*3/uL (ref 4.0–10.5)
nRBC: 0 % (ref 0.0–0.2)

## 2021-12-26 LAB — BASIC METABOLIC PANEL
Anion gap: 12 (ref 5–15)
BUN: 48 mg/dL — ABNORMAL HIGH (ref 8–23)
CO2: 25 mmol/L (ref 22–32)
Calcium: 9.7 mg/dL (ref 8.9–10.3)
Chloride: 105 mmol/L (ref 98–111)
Creatinine, Ser: 1.02 mg/dL — ABNORMAL HIGH (ref 0.44–1.00)
GFR, Estimated: 57 mL/min — ABNORMAL LOW (ref >60)
Glucose, Bld: 117 mg/dL — ABNORMAL HIGH (ref 70–99)
Potassium: 4.4 mmol/L (ref 3.5–5.1)
Sodium: 142 mmol/L (ref 135–145)
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
## 2021-12-26 NOTE — Progress Notes (Signed)
Physical Therapy Treatment Patient Details Name: Deborah [name] MRN: [id number] DOB: 1946-02-28 Today's Date: 12/26/2021   History of Present Illness Pt is a 76 y.o. F who [place] bus stop and admitted 12/08/2021. MRI showing small left thalamic stroke. Significant PMH: alcohol and cocaine abuse, CVA, HTN.    PT Comments    Pt sleeping supine on arrival.  Easy to rouse.  Performed mobility with minor balance impairments and improved stair trial.  Continue to recommend HHPT.     Recommendations for follow up therapy are one component of a multi-disciplinary discharge planning process, led by the attending physician.  Recommendations may be updated based on patient status, additional functional criteria and insurance authorization.  Follow Up Recommendations  Home health PT     Assistance Recommended at Discharge Frequent or constant Supervision/Assistance  Patient can return home with the following A little help with walking and/or transfers;A little help with bathing/dressing/bathroom;Assistance with cooking/housework;Direct supervision/assist for medications management;Assist for transportation;Help with stairs or ramp for entrance   Equipment Recommendations  None recommended by PT    Recommendations for Other Services       Precautions / Restrictions Precautions Precautions: Fall Restrictions Weight Bearing Restrictions: No     Mobility  Bed Mobility Overal bed mobility: Needs Assistance Bed Mobility: Supine to Sit, Sit to Supine     Supine to sit: Supervision Sit to supine: Supervision   General bed mobility comments: Pt able to move out of and back to bed with out support.    Transfers Overall transfer level: Needs assistance Equipment used: None Transfers: Sit to/from Stand Sit to Stand: Min guard           General transfer comment: Cues for hand placement    Ambulation/Gait Ambulation/Gait assistance: Min guard, +2 safety/equipment, Min  assist Gait Distance (Feet): 180 Feet Assistive device: 1 person hand held assist Gait Pattern/deviations: Step-through pattern, Decreased stride length, Staggering left, Staggering right Gait velocity: Decreased     General Gait Details: Increased ambulation with mild staggering,  Required decreased assistance as trial progressed.  Assist level continues to vary.   Stairs Stairs: Yes Stairs assistance: Min guard Stair Management: Two rails, Forwards Number of Stairs: 5 General stair comments: Pt better at placing foot on stairs, no LOB continues to present with poor depth perception.   Wheelchair Mobility    Modified Rankin (Stroke Patients Only) Modified Rankin (Stroke Patients Only) Pre-Morbid Rankin Score: No symptoms Modified Rankin: Moderately severe disability     Balance Overall balance assessment: Needs assistance Sitting-balance support: No upper extremity supported, Feet supported Sitting balance-Leahy Scale: Fair       Standing balance-Leahy Scale: Poor Standing balance comment: relies on external support                            Cognition Arousal/Alertness: Awake/alert Behavior During Therapy: Impulsive Overall Cognitive Status: Impaired/Different from baseline Area of Impairment: Orientation, Attention, Memory, Following commands, Safety/judgement, Awareness, Problem solving                 Orientation Level: Disoriented to, Time, Situation, Place Current Attention Level: Focused Memory: Decreased short-term memory, Decreased recall of precautions Following Commands: Follows one step commands inconsistently, Follows one step commands with increased time Safety/Judgement: Decreased awareness of safety, Decreased awareness of deficits Awareness: Intellectual Problem Solving: Slow processing, Decreased initiation, Difficulty sequencing, Requires tactile cues, Requires verbal cues General Comments: pt pleasantly confused, following  simple  1 step commands with increased time but demonstrating poor awareness, problem sovling and sequencing during basic ADL tasks.  Max cueing to problem solve direction of numbers to return to her room.pt reports therapists are her aunt and niece.        Exercises      General Comments        Pertinent Vitals/Pain Pain Assessment Pain Assessment: No/denies pain    Home Living                          Prior Function            PT Goals (current goals can now be found in the care plan section) Acute Rehab PT Goals Patient Stated Goal: Go home with her sister Potential to Achieve Goals: Fair Progress towards PT goals: Progressing toward goals    Frequency    Min 3X/week      PT Plan Current plan remains appropriate    Co-evaluation              AM-PAC PT "6 Clicks" Mobility   Outcome Measure  Help needed turning from your back to your side while in a flat bed without using bedrails?: A Little Help needed moving from lying on your back to sitting on the side of a flat bed without using bedrails?: A Little Help needed moving to and from a bed to a chair (including a wheelchair)?: A Little Help needed standing up from a chair using your arms (e.g., wheelchair or bedside chair)?: A Little Help needed to walk in hospital room?: A Little Help needed climbing 3-5 steps with a railing? : A Lot 6 Click Score: 17    End of Session Equipment Utilized During Treatment: Gait belt Activity Tolerance: Patient tolerated treatment well;Patient limited by lethargy Patient left: in bed;with call bell/phone within reach;with bed alarm set;with family/visitor present Nurse Communication: Mobility status PT Visit Diagnosis: Other abnormalities of gait and mobility (R26.89);Other symptoms and signs involving the nervous system (R29.898)     Time: 1004-1020 PT Time Calculation (min) (ACUTE ONLY): 16 min  Charges:  $Gait Training: 8-22 mins                      Bonney Leitz , PTA Acute Rehabilitation Services Office [phone]    Florestine Avers 12/26/2021, 12:57 PM

## 2021-12-26 NOTE — TOC Progression Note (Addendum)
Transition of Care [facility]) - Progression Note    Patient Details  Name: Deborah [name] MRN: [id number] Date of Birth: 1946/02/23  [facility] CM/SW Contact  Carley Hammed, [place] Phone Number: 12/26/2021, 10:55 AM  Clinical Narrative:    CSW confirmed with [facility] they are able to take pt, and requested CSW start authorization. CSW found while starting authorization that the insurance in the file is not in the system properly. CSW spoke with insurance company that stated pt's policy that would cover SNF is "not fully verified" and family would need to contact member services to get it verified. CSW left VM for niece requesting a return call. TOC will continue to follow for DC needs.   4:20 Authorization started.  Expected Discharge Plan: Skilled Nursing Facility Barriers to Discharge: Continued Medical Work up  Expected Discharge Plan and Services Expected Discharge Plan: Skilled Nursing Facility In-house Referral: Clinical Social Work Discharge Planning Services: CM Consult   Living arrangements for the past 2 months: Single Family Home                                       Social Determinants of Health (SDOH) Interventions    Readmission Risk Interventions     No data to display

## 2021-12-26 NOTE — Plan of Care (Signed)
  Problem: Clinical Measurements: Goal: Will remain free from infection Outcome: Progressing   Problem: Activity: Goal: Risk for activity intolerance will decrease Outcome: Progressing   Problem: Coping: Goal: Level of anxiety will decrease Outcome: Progressing   Problem: Safety: Goal: Ability to remain free from injury will improve Outcome: Progressing   

## 2021-12-26 NOTE — Progress Notes (Signed)
Nutrition Follow-up  DOCUMENTATION CODES:  Severe malnutrition in context of chronic illness  INTERVENTION:  Continue current diet as ordered Ensure Enlive po TID, each supplement provides 350 kcal and 20 grams of protein. MVI with minerals daily, thiamine daily  NUTRITION DIAGNOSIS:  Severe Malnutrition related to social / environmental circumstances as evidenced by severe fat depletion, severe muscle depletion. - remains applicable  GOAL:  Patient will meet greater than or equal to 90% of their needs - progressing, diet and supplements in place  MONITOR:  Supplement acceptance, Skin, Labs, PO intake  REASON FOR ASSESSMENT:  Other (Comment), LOS (low BMI)    ASSESSMENT:  Pt with hx of HTN, hx of EtOH abuse and tobacco abuse, cocaine abuse, and hx CVA presented to ED after being found down at the bus stop. Pt with AMS and imaging shows a left thalamic stroke. Also positive for cocaine on admission.  Pt sleeping at the time of assessment, did not wake when name was called. Sitter present at bedside. States that pt has a great appetite and ate well at breakfast this AM and is drinking her ensure. Obtained and entered new bed weight.   Will continue current nutrition plan   Average Meal Intake: 6/26-6/29: 73% intake x 8 recorded meals 6/30-7/6: 94% intake x 14 recorded meals  Nutritionally Relevant Medications: Scheduled Meds:  Ensure Enlive  237 mL Oral TID BM   multivitamin with minerals  1 tablet Oral Daily   rosuvastatin  10 mg Oral Daily   Labs Reviewed  NUTRITION - FOCUSED PHYSICAL EXAM: Flowsheet Row Most Recent Value  Orbital Region Severe depletion  Upper Arm Region Moderate depletion  Thoracic and Lumbar Region Moderate depletion  Buccal Region Severe depletion  Temple Region Moderate depletion  Clavicle Bone Region Severe depletion  Clavicle and Acromion Bone Region Severe depletion  Scapular Bone Region Severe depletion  Dorsal Hand Moderate depletion   Patellar Region Severe depletion  Anterior Thigh Region Severe depletion  Posterior Calf Region Severe depletion  Edema (RD Assessment) None  Hair Reviewed  Eyes Reviewed  Mouth Reviewed  Skin Reviewed  Nails Reviewed   Diet Order:   Diet Order             Diet regular Room service appropriate? Yes; Fluid consistency: Thin  Diet effective now                   EDUCATION NEEDS:  No education needs have been identified at this time  Skin:  Skin Assessment: Reviewed RN Assessment  Last BM:  7/5 - type 5  Height:  Ht Readings from Last 1 Encounters:  12/08/21 5\' 6"  (1.676 m)    Weight:  Wt Readings from Last 1 Encounters:  12/26/21 54.4 kg    Ideal Body Weight:  59.1 kg  BMI:  Body mass index is 19.36 kg/m.  Estimated Nutritional Needs:  Kcal:  1600-1800 kcal/d Protein:  80-95g/d Fluid:  >1.8L/d    02/26/22, RD, LDN Clinical Dietitian RD pager # available in [facility]  After hours/weekend pager # available in [facility]

## 2021-12-26 NOTE — Progress Notes (Signed)
  Progress Note  Patient: Caralyn Twining OBS:[id number] DOB: [birth year]  DOA: 12/08/2021  DOS: 12/26/2021    Brief hospital course: Ms. Breault is a 76 yo female with PMH anxiety, asthma, cocaine use, history of CVA, HTN, tobacco use, alcohol use who was brought to the hospital with altered mentation and found being down at a bus stop. Urine drug screen was recently positive for cocaine and benzos.  Initial CT head was concerning for PRES syndrome; she underwent MRI brain which showed acute infarct involving left thalamus and ruled out PRES. Due to ongoing altered mentation, she also underwent EEG which ruled out underlying seizure activity.   Patient's mental status continues to wax/wane but generally and slowly improving. Patient remains cleared for disposition from a medical standpoint but given her mental status, behavioral outbursts/episodes she requires disposition with constant supervision and assistance. The patient's only available family, her Niece, has been arranging transportation to bring the patient home with her. We will pursue alternative options as well.   Assessment and Plan: Acute metabolic encephalopathy, ongoing: Likely due to substance use, hypertensive encephalopathy and stroke. Seizure activity ruled out per neurology and with EEG - Continue PRN ativan or haldol for agitation  - Patient's mental status stabilizing - much more appropriate over the past 24h but remains labile at points.   Cerebrovascular accident (CVA) of left thalamus (HCC) - Continue ASA, high intensity statin per neurology recommendations. LDL 75. HbA1c 5.5% - Follow up with neurology in 1-2 months.  - Counseled on drug use cessation   Essential hypertension - Continue amlodipine, lisinopril   Mood disorder: Primary vs. substance-induced.  - Sitter strongly encouraged over physical restraints or sedation.  - Continue zyprexa, SSRI, prn lorazepam and haldol.    Substance abuse (HCC) - UDS noted with  cocaine on admission - TOC consult placed  Pericardial effusion: No evidence of tamponade.  - Also with mention of LVH and elevated mid-cavitary gradient, suggest repeat echocardiogram for further evaluation at follow up.  Severe protein-calorie malnutrition:  - Supplement protein as able.   Subjective: NAEON. Eating and drinking normally. Sitter at bedside reports she remains unsteady when walking to bathroom.  Objective: Vitals:   12/26/21 0716 12/26/21 1209 12/26/21 1211 12/26/21 1316  BP: (!) 100/58 (!) 151/72 128/65   Pulse: 62 86 75   Resp: 14 14    Temp: 98.7 F (37.1 C) 98.5 F (36.9 C)    TempSrc: Oral Oral    SpO2: 100% 100% 100%   Weight:    54.4 kg  Height:       Chronically ill-appearing female in no distress Clear, nonlabored RRR No edema Redirectable, calm.  Data Personally reviewed: Echo 6/20 showing normal hyperdynamic LV systolic function with concern for hypertrophy. Also aortic valve not well visualized (limited study due to pt challenges with cooperation. +Pericardial effusion posterior and lateral to LV with normal IVC and no mention of RV diastolic flattening.   Family Communication: None at bedside  Disposition: Status is: Inpatient Remains inpatient appropriate because: Unsafe DC Planned Discharge Destination:  Requires SNF level of care/supervision.  Tyrone Nine, MD 12/26/2021 1:19 PM Page by [email or web address]

## 2021-12-27 DIAGNOSIS — I6381 Other cerebral infarction due to occlusion or stenosis of small artery: Secondary | ICD-10-CM | POA: Diagnosis not present

## 2021-12-27 DIAGNOSIS — F39 Unspecified mood [affective] disorder: Secondary | ICD-10-CM | POA: Diagnosis not present

## 2021-12-27 DIAGNOSIS — I10 Essential (primary) hypertension: Secondary | ICD-10-CM | POA: Diagnosis not present

## 2021-12-27 DIAGNOSIS — G9341 Metabolic encephalopathy: Secondary | ICD-10-CM | POA: Diagnosis not present

## 2021-12-27 MED ORDER — ROSUVASTATIN CALCIUM 10 MG PO TABS: 10.0000 mg | ORAL_TABLET | Freq: Every day | ORAL | Status: AC

## 2021-12-27 MED ORDER — LISINOPRIL 20 MG PO TABS: 20.0000 mg | ORAL_TABLET | Freq: Every day | ORAL | Status: AC

## 2021-12-27 MED ORDER — ENSURE ENLIVE PO LIQD: 237.0000 mL | Freq: Three times a day (TID) | ORAL | Status: AC

## 2021-12-27 MED ORDER — OLANZAPINE 10 MG PO TABS: 10.0000 mg | ORAL_TABLET | Freq: Every day | ORAL | Status: DC

## 2021-12-27 MED ORDER — LORAZEPAM 0.5 MG PO TABS: 0.5000 mg | ORAL_TABLET | Freq: Four times a day (QID) | ORAL | 0 refills | Status: DC | PRN

## 2021-12-27 MED ORDER — AMLODIPINE BESYLATE 5 MG PO TABS: 5.0000 mg | ORAL_TABLET | Freq: Every day | ORAL | Status: AC

## 2021-12-27 MED ORDER — NICOTINE 21 MG/24HR TD PT24: 21.0000 mg | MEDICATED_PATCH | Freq: Every day | TRANSDERMAL | Status: AC

## 2021-12-27 MED ORDER — ASPIRIN 325 MG PO TABS: 325.0000 mg | ORAL_TABLET | Freq: Every day | ORAL | Status: AC

## 2021-12-27 NOTE — Discharge Summary (Addendum)
Physician Discharge Summary   Patient: Deborah [name] MRN: [id number] DOB: Dec 10, 1978  Admit date:     12/08/2021  Discharge date: 12/27/21  Discharge Physician: Tyrone Nine   PCP: Pcp, No   Recommendations at discharge:   Continue cocaine cessation counseling Consider repeat echocardiogram at follow up to monitor LVH and pericardial effusion  Discharge Diagnoses: Principal Problem:   Acute metabolic encephalopathy Active Problems:   Cerebrovascular accident (CVA) of left thalamus (HCC)   Essential hypertension   Substance abuse (HCC)   Mood disorder (HCC)   Protein-calorie malnutrition, severe  Hospital Course: Deborah [name] is a 76 yo female with PMH anxiety, asthma, cocaine use, history of CVA, HTN, tobacco use, alcohol use who was brought to the hospital with altered mentation and found being down at a bus stop. Urine drug screen was recently positive for cocaine and benzos.  Initial CT head was concerning for PRES syndrome; she underwent MRI brain which showed acute infarct involving left thalamus and ruled out PRES. Due to ongoing altered mentation, she also underwent EEG which ruled out underlying seizure activity.   Patient's mental status continues to wax/wane but generally and slowly improving. Patient remains cleared for disposition from a medical standpoint but given her mental status, behavioral outbursts/episodes she requires disposition with constant supervision and assistance. The patient's only available family, her Niece, has been arranging transportation to bring the patient home with her though due to family emergency, is unable to provide any assistance. SNF level of care has been pursued.    Assessment and Plan: Acute metabolic encephalopathy, ongoing: Likely due to substance use, hypertensive encephalopathy and stroke. Seizure activity ruled out per neurology and with EEG - Continue PRN ativan or haldol for agitation  - Patient's mental status stabilizing - much more  appropriate over the past 24h but remains labile at points.   Cerebrovascular accident (CVA) of left thalamus (HCC), M2 stenosis:  - Continue ASA, high intensity statin per neurology recommendations. LDL 75. HbA1c 5.5% - Follow up with neurology in 1-2 months.  - Counseled on drug use cessation   Essential hypertension - Continue amlodipine, lisinopril   Mood disorder: Primary vs. substance-induced.  - Sitter strongly encouraged over physical restraints or sedation.  - Continue zyprexa, increased doe to  qHS, SSRI    Substance abuse (HCC) - UDS noted with cocaine on admission   Pericardial effusion: No evidence of tamponade.  - Also with mention of LVH and elevated mid-cavitary gradient, suggest repeat echocardiogram for further evaluation at follow up.   Severe protein-calorie malnutrition:  - Supplement protein as able. Ensure recommended.  Consultants: Neurology Procedures performed: Echo  Disposition: Skilled nursing facility Diet recommendation:  Cardiac diet DISCHARGE MEDICATION: Allergies as of 12/27/2021   No Known Allergies      Medication List     STOP taking these medications    hydrochlorothiazide 25 MG tablet Commonly known as: HYDRODIURIL       TAKE these medications    amLODipine 5 MG tablet Commonly known as: NORVASC Take 1 tablet (5 mg total) by mouth daily. Start taking on: December 28, 2021   aspirin 325 MG tablet Take 1 tablet (325 mg total) by mouth daily. Start taking on: December 28, 2021 What changed:  medication strength how much to take   feeding supplement Liqd Take 237 mLs by mouth 3 (three) times daily between meals.   FLUoxetine 20 MG capsule Commonly known as: PROZAC Take 20 mg by mouth daily.   lisinopril 20  MG tablet Commonly known as: ZESTRIL Take 1 tablet (20 mg total) by mouth daily. Start taking on: December 28, 2021 What changed:  medication strength how much to take   LORazepam 0.5 MG tablet Commonly known as:  ATIVAN Take 1 tablet (0.5 mg total) by mouth every 6 (six) hours as needed (agitation).   memantine 5 MG tablet Commonly known as: NAMENDA Take 5 mg by mouth 2 (two) times daily.   nicotine 21 mg/24hr patch Commonly known as: NICODERM CQ - dosed in mg/24 hours Place 1 patch (21 mg total) onto the skin daily. Start taking on: December 28, 2021   OLANZapine 10 MG tablet Commonly known as: ZYPREXA Take 1 tablet (10 mg total) by mouth at bedtime. What changed:  medication strength how much to take   rosuvastatin 10 MG tablet Commonly known as: CRESTOR Take 1 tablet (10 mg total) by mouth daily. Start taking on: December 28, 2021   Vitamin D (Cholecalciferol) 25 MCG (1000 UT) Tabs Take 1 tablet by mouth daily.        Contact information for after-discharge care     Destination     [facility] .   Service: Skilled Nursing Contact information: [address] [place] [address] [phone]                    Discharge Exam: Filed Weights   12/10/21 0500 12/11/21 0500 12/26/21 1316  Weight: 55.2 kg 52.2 kg 54.4 kg  BP 111/61 (BP Location: Left Arm)   Pulse 62   Temp 98.4 F (36.9 C) (Oral)   Resp 17   Ht 5\' 6"  (1.676 m)   Wt 54.4 kg   SpO2 100%   BMI 19.36 kg/m   No distress RRR, no edema Clear, nonlabored Calm  Condition at discharge: stable  The results of significant diagnostics from this hospitalization (including imaging, microbiology, ancillary and laboratory) are listed below for reference.   Imaging Studies: ECHOCARDIOGRAM COMPLETE  Result Date: 12/10/2021    ECHOCARDIOGRAM REPORT   Patient Name:   12/12/2021 Date of Exam: 12/10/2021 Medical Rec #:  12/12/2021    Height:       66.0 in Accession #:    [id number]   Weight:       121.7 lb Date of Birth:  07-17-1945   BSA:          1.619 m Patient Age:    76 years     BP:           182/140 mmHg Patient Gender: F            HR:           67 bpm. Exam Location:   Inpatient Procedure: 2D Echo, 3D Echo, Cardiac Doppler, Color Doppler and Strain Analysis Indications:    Stroke. I63.9  History:        Patient has prior history of Echocardiogram examinations, most                 recent 07/06/2017. Stroke, Signs/Symptoms:Syncope; Risk                 Factors:Hypertension. Cocaine use.  Sonographer:    07/08/2017 RDCS Referring Phys: Sheralyn Boatman [id number]  Sonographer Comments: Technically difficult study due to poor echo windows. Image acquisition challenging due to patient behavioral factors. and Image acquisition challenging due to uncooperative patient. Patient attempted to bite, narrowly escaped. Difficult exam. Patient moving througout  exam. Patient in bilateral restraints and posey belt. Study interrupted by tech. IMPRESSIONS  1. Left ventricular ejection fraction, by estimation, is 70 to 75%. The left ventricle has hyperdynamic function. The left ventricle has no regional wall motion abnormalities. There is moderate left ventricular hypertrophy. Left ventricular diastolic parameters are consistent with Grade I diastolic dysfunction (impaired relaxation). Mid cavitary gradient (resting) 17 mm Hg, likely a component of hypertrophy and hyperdymanic function. Patient unable to Valsalva. GLS - 24 %, a normal reflection of function. There ia apical-basal sparring pattern on regional strain comparison but with suboptimal basal strain acquistion. Overall hypertrophy less suggestive of cardiac amyloidosis.  2. Right ventricular systolic function is normal. The right ventricular size is normal. Mildly increased right ventricular wall thickness. Tricuspid regurgitation signal is inadequate for assessing PA pressure.  3. The mitral valve is grossly normal. No evidence of mitral valve regurgitation.  4. The aortic valve was not well visualized. Aortic valve regurgitation is not visualized.  5. The pericardial effusion is posterior and lateral to the left ventricle.  6. The inferior vena  cava is normal in size with greater than 50% respiratory variability, suggesting right atrial pressure of 3 mmHg. Comparison(s): Similar to prior, peak mid cavitary gradient slightly lower than prior study. FINDINGS  Left Ventricle: Left ventricular ejection fraction, by estimation, is 70 to 75%. The left ventricle has hyperdynamic function. The left ventricle has no regional wall motion abnormalities. The left ventricular internal cavity size was small. There is moderate left ventricular hypertrophy. Left ventricular diastolic parameters are consistent with Grade I diastolic dysfunction (impaired relaxation). Right Ventricle: The right ventricular size is normal. Mildly increased right ventricular wall thickness. Right ventricular systolic function is normal. Tricuspid regurgitation signal is inadequate for assessing PA pressure. Left Atrium: Left atrial size was normal in size. Right Atrium: Right atrial size was normal in size. Pericardium: Trivial pericardial effusion is present. The pericardial effusion is posterior and lateral to the left ventricle. Mitral Valve: The mitral valve is grossly normal. No evidence of mitral valve regurgitation. Tricuspid Valve: The tricuspid valve is normal in structure. Tricuspid valve regurgitation is not demonstrated. No evidence of tricuspid stenosis. Aortic Valve: The aortic valve was not well visualized. Aortic valve regurgitation is not visualized. Aortic valve mean gradient measures 10.0 mmHg. Aortic valve peak gradient measures 17.0 mmHg. Aortic valve area, by VTI measures 2.25 cm. Pulmonic Valve: The pulmonic valve was not well visualized. Pulmonic valve regurgitation is not visualized. Aorta: The aortic root is normal in size and structure. Venous: The inferior vena cava is normal in size with greater than 50% respiratory variability, suggesting right atrial pressure of 3 mmHg. IAS/Shunts: No atrial level shunt detected by color flow Doppler.  LEFT VENTRICLE PLAX 2D  LVIDd:         2.90 cm     Diastology LVIDs:         1.90 cm     LV e' medial:    5.33 cm/s LV PW:         1.30 cm     LV E/e' medial:  13.3 LV IVS:        1.40 cm     LV e' lateral:   6.98 cm/s LVOT diam:     2.00 cm     LV E/e' lateral: 10.2 LV SV:         83 LV SV Index:   51 LVOT Area:     3.14 cm  LV Volumes (MOD) LV vol  d, MOD A2C: 54.1 ml LV vol d, MOD A4C: 76.4 ml LV vol s, MOD A2C: 12.0 ml LV vol s, MOD A4C: 15.7 ml LV SV MOD A2C:     42.1 ml LV SV MOD A4C:     76.4 ml LV SV MOD BP:      50.7 ml RIGHT VENTRICLE             IVC RV S prime:     13.30 cm/s  IVC diam: 1.70 cm TAPSE (M-mode): 2.3 cm LEFT ATRIUM             Index        RIGHT ATRIUM           Index LA diam:        3.10 cm 1.91 cm/m   RA Area:     11.00 cm LA Vol (A2C):   16.6 ml 10.25 ml/m  RA Volume:   25.10 ml  15.50 ml/m LA Vol (A4C):   37.7 ml 23.28 ml/m LA Biplane Vol: 24.5 ml 15.13 ml/m  AORTIC VALVE AV Area (Vmax):    2.46 cm AV Area (Vmean):   2.25 cm AV Area (VTI):     2.25 cm AV Vmax:           206.00 cm/s AV Vmean:          139.500 cm/s AV VTI:            0.370 m AV Peak Grad:      17.0 mmHg AV Mean Grad:      10.0 mmHg LVOT Vmax:         161.00 cm/s LVOT Vmean:        99.800 cm/s LVOT VTI:          0.265 m LVOT/AV VTI ratio: 0.72  AORTA Ao Root diam: 2.80 cm MITRAL VALVE MV Area (PHT): 2.87 cm     SHUNTS MV Decel Time: 264 msec     Systemic VTI:  0.26 m MV E velocity: 71.10 cm/s   Systemic Diam: 2.00 cm MV A velocity: 111.00 cm/s MV E/A ratio:  0.64 Riley Lam MD Electronically signed by Riley Lam MD Signature Date/Time: 12/10/2021/1:44:24 PM    Final    VAS US CAROTID (at [facility] and [facility] only)  Result Date: 12/10/2021 Carotid Arterial Duplex Study Patient Name:  Eloise Levels  Date of Exam:   12/09/2021 Medical Rec #: [id number]     Accession #:    [id number] Date of Birth: August 01, 1945    Patient Gender: F Patient Age:   47 years Exam Location:  [facility] Procedure:      VAS US CAROTID Referring  Phys: Lynnell Catalan --------------------------------------------------------------------------------  Indications:  CVA. Risk Factors: Hypertension, past history of smoking, prior CVA. Performing Technologist: Magdalene River  Examination Guidelines: A complete evaluation includes B-mode imaging, spectral Doppler, color Doppler, and power Doppler as needed of all accessible portions of each vessel. Bilateral testing is considered an integral part of a complete examination. Limited examinations for reoccurring indications may be performed as noted.  Right Carotid Findings: +----------+--------+--------+--------+------------------+------------------+           PSV cm/sEDV cm/sStenosisPlaque DescriptionComments           +----------+--------+--------+--------+------------------+------------------+ CCA Prox  150     16                                intimal thickening +----------+--------+--------+--------+------------------+------------------+  CCA Distal81      24                                intimal thickening +----------+--------+--------+--------+------------------+------------------+ ICA Prox  38      14              calcific                             +----------+--------+--------+--------+------------------+------------------+ ICA Distal70      31                                                   +----------+--------+--------+--------+------------------+------------------+ ECA       72      9               heterogenous                         +----------+--------+--------+--------+------------------+------------------+ +----------+--------+-------+----------------+-------------------+           PSV cm/sEDV cmsDescribe        Arm Pressure (mmHG) +----------+--------+-------+----------------+-------------------+ Subclavian162     17     Multiphasic, WNL                    +----------+--------+-------+----------------+-------------------+  +---------+--------+--+--------+--+---------+ VertebralPSV cm/s39EDV cm/s13Antegrade +---------+--------+--+--------+--+---------+  Left Carotid Findings: +----------+--------+--------+--------+------------------+------------------+           PSV cm/sEDV cm/sStenosisPlaque DescriptionComments           +----------+--------+--------+--------+------------------+------------------+ CCA Prox  101     19                                intimal thickening +----------+--------+--------+--------+------------------+------------------+ CCA Distal59      17                                intimal thickening +----------+--------+--------+--------+------------------+------------------+ ICA Prox  25      8               heterogenous                         +----------+--------+--------+--------+------------------+------------------+ ICA Distal58      17                                                   +----------+--------+--------+--------+------------------+------------------+ ECA       54      11              heterogenous                         +----------+--------+--------+--------+------------------+------------------+ +----------+--------+--------+----------------+-------------------+           PSV cm/sEDV cm/sDescribe        Arm Pressure (mmHG) +----------+--------+--------+----------------+-------------------+ Subclavian150             Multiphasic, WNL                    +----------+--------+--------+----------------+-------------------+ +---------+--------+--+--------+--+---------+  VertebralPSV cm/s44EDV cm/s16Antegrade +---------+--------+--+--------+--+---------+   Summary: Right Carotid: Velocities in the right ICA are consistent with a 1-39% stenosis. Left Carotid: Velocities in the left ICA are consistent with a 1-39% stenosis. Vertebrals:  Bilateral vertebral arteries demonstrate antegrade flow. Subclavians: Normal flow hemodynamics were seen in  bilateral subclavian              arteries. *See table(s) above for measurements and observations.  Electronically signed by Delia Heady MD on 12/10/2021 at 1:12:16 PM.    Final    EEG adult  Result Date: 12/09/2021 Charlsie Quest, MD     12/09/2021  5:59 PM Patient Name: Rei Contee MRN: [id number] Epilepsy Attending: Charlsie Quest Referring Physician/Provider: Micki Riley, MD Date: 12/09/2021 Duration: 23.33 mins Patient history: 76 year old African-American lady with altered mental status without focal deficits of unclear etiology. EEG to evaluate for seizure Level of alertness: Awake, asleep AEDs during EEG study: None Technical aspects: This EEG study was done with scalp electrodes positioned according to the 10-20 International system of electrode placement. Electrical activity was acquired at a sampling rate of  and reviewed with a high frequency filter of  and a low frequency filter of . EEG data were recorded continuously and digitally stored. Description: The posterior dominant rhythm consists 8 Hz activity of moderate voltage (25-35 uV) seen predominantly in posterior head regions, symmetric and reactive to eye opening and eye closing. Sleep was characterized by vertex waves, sleep spindles (12 to 14 Hz), maximal frontocentral region. Hyperventilation and photic stimulation were not performed.   IMPRESSION: This study is within normal limits. No seizures or epileptiform discharges were seen throughout the recording. Charlsie Quest   CT ANGIO HEAD NECK W WO CM  Result Date: 12/09/2021 CLINICAL DATA:  Stroke, follow-up. EXAM: CT ANGIOGRAPHY HEAD AND NECK TECHNIQUE: Multidetector CT imaging of the head and neck was performed using the standard protocol during bolus administration of intravenous contrast. Multiplanar CT image reconstructions and MIPs were obtained to evaluate the vascular anatomy. Carotid stenosis measurements (when applicable) are obtained utilizing NASCET  criteria, using the distal internal carotid diameter as the denominator. RADIATION DOSE REDUCTION: This exam was performed according to the departmental dose-optimization program which includes automated exposure control, adjustment of the mA and/or kV according to patient size and/or use of iterative reconstruction technique. CONTRAST:  75mL OMNIPAQUE IOHEXOL 350 MG/ML SOLN COMPARISON:  Brain MRI 12/09/2021.  Head CT 12/08/2021. FINDINGS: CT HEAD FINDINGS Brain: Mild generalized parenchymal atrophy. Known acute infarct within the left thalamus, similar in extent as compared to the brain MRI performed earlier today. Moderate patchy and ill-defined hypoattenuation within the cerebral white matter, nonspecific but compatible with chronic small vessel ischemic disease. There is no acute intracranial hemorrhage. No demarcated cortical infarct. No extra-axial fluid collection. No evidence of an intracranial mass. No midline shift. Vascular: No hyperdense vessel.  Atherosclerotic calcifications. Skull: No fracture or aggressive osseous lesion. Sinuses: Small-volume fluid scattered within the right ethmoid air cells. Minimal mucosal thickening within the anterior left ethmoid air cells. 5 mm osteoma within an anterior left ethmoid air cell. Orbits: Orbital mass or acute orbital finding. ASPECTS [facility] Stroke Program Early CT Score) - Ganglionic level infarction (caudate, lentiform nuclei, internal capsule, insula, M1-M3 cortex): 7 - Supraganglionic infarction (M4-M6 cortex): 3 Total score (0-10 with 10 being normal): 10 These results were communicated to Dr. Pearlean Brownie at 3:19 pmon 6/19/2023by text page via the [facility] messaging system. Review of the MIP images confirms the above findings CTA  NECK FINDINGS Aortic arch: Standard aortic branching. Atherosclerotic plaque within the proximal major branch vessels of the neck. Streak and beam hardening artifact arising from a dense left-sided contrast bolus partially obscures the  left subclavian artery. Within this limitation, there is no appreciable hemodynamically significant innominate or proximal subclavian artery stenosis. Right carotid system: CCA and ICA patent within the neck without hemodynamically stenosis (50% or greater). Mild atherosclerotic plaque about the carotid bifurcation and within the proximal ICA. Left carotid system: CCA and ICA patent within the neck without hemodynamically significant stenosis (50% or greater). Mild atherosclerotic plaque about the carotid bifurcation and within the proximal ICA. Vertebral arteries: Vertebral arteries codominant and patent within the neck without stenosis. Skeleton: Straightening of the expected cervical lordosis. Vertebral ankylosis at C5-C6 and C6-C7. Cervical spondylosis. No acute fracture or aggressive osseous lesion. Other neck: No neck mass or cervical lymphadenopathy. Upper chest: No consolidation within the imaged lung apices. Review of the MIP images confirms the above findings CTA HEAD FINDINGS Anterior circulation: The intracranial internal carotid arteries are patent. Non-stenotic atherosclerotic plaque within both vessels. The M1 middle cerebral arteries are patent. No M2 proximal branch occlusion is identified. Severe stenosis within a proximal M2 right MCA vessel (series 17, image 18). The anterior cerebral arteries are patent. No intracranial aneurysm is identified. Posterior circulation: The intracranial vertebral arteries are patent. The basilar artery is patent. The posterior cerebral arteries are patent. Posterior communicating arteries are diminutive or absent, bilaterally. Venous sinuses: Within the limitations of contrast timing, no convincing thrombus. Anatomic variants: As described. Review of the MIP images confirms the above findings No emergent large vessel occlusion identified. This finding and CTA head impression #2 communicated to Dr. Pearlean Brownie at 3:33 pm on 12/09/2021 by text page via the [facility] messaging  system. IMPRESSION: CT head: 1. Known acute infarct within the left thalamus, similar in extent as compared to the brain MRI performed earlier today. 2. No acute intracranial hemorrhage or acute demarcated cortical infarction. 3. Moderate chronic small vessel ischemic changes within the cerebral white matter. 4. Mild generalized parenchymal atrophy. 5. Paranasal sinus disease, as described. CTA neck: 1. The common carotid, internal carotid and vertebral arteries are patent within the neck without hemodynamically significant stenosis. Mild atherosclerotic plaque about the carotid bifurcations and within the proximal ICAs. 2.  Aortic Atherosclerosis (ICD10-I70.0). CTA head: 1. No intracranial large vessel occlusion is identified. 2. Severe stenosis within a proximal M2 right MCA vessel. 3. Non-stenotic atherosclerotic plaque within the intracranial internal carotid arteries, bilaterally. Electronically Signed   By: Jackey Loge D.O.   On: 12/09/2021 15:35   MR BRAIN WO CONTRAST  Result Date: 12/09/2021 CLINICAL DATA:  Encephalopathy altered mental status EXAM: MRI HEAD WITHOUT CONTRAST TECHNIQUE: Multiplanar, multiecho pulse sequences of the brain and surrounding structures were obtained without intravenous contrast. COMPARISON:  No prior MRI, correlation is made with CT head 12/08/2021 and 09/25/2017 FINDINGS: Brain: Restricted diffusion with ADC correlate in the left thalamus (series 9, images 74-77), likely acute infarct. No acute hemorrhage, mass, mass effect, or midline shift. Foci of hemosiderin deposition in the left hippocampus, lateral left temporal lobe, and left parieto-occipital region, likely sequela of prior hypertensive microhemorrhages. Confluent T2 hyperintense signal in the periventricular white matter, likely the sequela of moderate chronic small vessel ischemic disease. Vascular: Normal flow voids. Skull and upper cervical spine: Normal marrow signal. Sinuses/Orbits: Mucosal thickening in the  ethmoid air cells. The orbits are unremarkable. Other: The mastoids are well aerated. IMPRESSION: 1. Acute infarct  in the left thalamus. 2. No evidence of posterior reversible encephalopathy syndrome. The CT findings concerning for PRES correlate with what is favored to represent the sequela of worsening chronic small vessel ischemic disease. These results were called by telephone at the time of interpretation on 12/09/2021 at 2:46 am to provider YAP , who verbally acknowledged these results. Electronically Signed   By: Wiliam Ke M.D.   On: 12/09/2021 02:46   DG Abd 1 View  Result Date: 12/08/2021 CLINICAL DATA:  [id number].  Metal screening. EXAM: ABDOMEN - 1 VIEW COMPARISON:  None Available. FINDINGS: The bowel gas pattern is normal. No radio-opaque calculi or other significant radiographic abnormality are seen. No unexpected retained metallic foreign body. IMPRESSION: No unexpected retained metallic foreign body. Electronically Signed   By: Helyn Numbers M.D.   On: 12/08/2021 22:18   DG Chest 1 View  Result Date: 12/08/2021 CLINICAL DATA:  [id number].  Metal screening EXAM: CHEST  1 VIEW COMPARISON:  12/01/2020 FINDINGS: Lungs are well expanded, symmetric, and clear. No pneumothorax or pleural effusion. Cardiac size within normal limits. Pulmonary vascularity is normal. Osseous structures are age-appropriate. No acute bone abnormality. No unexpected retained metallic foreign body. IMPRESSION: No active disease. Electronically Signed   By: Helyn Numbers M.D.   On: 12/08/2021 22:16   CT Head Wo Contrast  Result Date: 12/08/2021 CLINICAL DATA:  Mental status change, unknown cause EXAM: CT HEAD WITHOUT CONTRAST TECHNIQUE: Contiguous axial images were obtained from the base of the skull through the vertex without intravenous contrast. RADIATION DOSE REDUCTION: This exam was performed according to the departmental dose-optimization program which includes automated exposure control, adjustment of the mA and/or  kV according to patient size and/or use of iterative reconstruction technique. COMPARISON:  CT head 09/25/17 BRAIN: BRAIN Interval development of white matter hypodensity within the parietal and occipital lobes. Cerebral ventricle sizes are concordant with the degree of cerebral volume loss. Patchy and confluent areas of decreased attenuation are noted throughout the deep and periventricular white matter of the cerebral hemispheres bilaterally, compatible with chronic microvascular ischemic disease. No evidence of large-territorial acute infarction. No parenchymal hemorrhage. No mass lesion. No extra-axial collection. No mass effect or midline shift. No hydrocephalus. Basilar cisterns are patent. Vascular: No hyperdense vessel. Atherosclerotic calcifications are present within the cavernous internal carotid arteries. Skull: No acute fracture or focal lesion. Sinuses/Orbits: Paranasal sinuses and mastoid air cells are clear. The orbits are unremarkable. Other: None. IMPRESSION: Findings most compatible with posterior reversible encephalopathy syndrome. Consider MRI for further evaluation if clinically indicated. Electronically Signed   By: Tish Frederickson M.D.   On: 12/08/2021 16:39    Microbiology: Results for orders placed or performed during the hospital encounter of 12/08/21  MRSA Next Gen by PCR, Nasal     Status: None   Collection Time: 12/08/21  8:51 PM   Specimen: Nasal Mucosa; Nasal Swab  Result Value Ref Range Status   MRSA by PCR Next Gen NOT DETECTED NOT DETECTED Final    Comment: (NOTE) The GeneXpert MRSA Assay (FDA approved for NASAL specimens only), is one component of a comprehensive MRSA colonization surveillance program. It is not intended to diagnose MRSA infection nor to guide or monitor treatment for MRSA infections. Test performance is not FDA approved in patients less than 66 years old. Performed at [facility], [address] [place], [place] [address]      Labs: CBC: Recent Labs  Lab 12/26/21 1348  WBC 6.8  HGB 11.2*  HCT 33.9*  MCV 95.8  PLT 351   Basic Metabolic Panel: Recent Labs  Lab 12/26/21 1348  NA 142  K 4.4  CL 105  CO2 25  GLUCOSE 117*  BUN 48*  CREATININE 1.02*  CALCIUM 9.7   Liver Function Tests: No results for input(s): "AST", "ALT", "ALKPHOS", "BILITOT", "PROT", "ALBUMIN" in the last 168 hours. CBG: Recent Labs  Lab 12/22/21 1554  GLUCAP 125*    Discharge time spent: greater than 30 minutes.  Signed: Tyrone Nineyan B Janashia Parco, MD [facility] 12/27/2021

## 2021-12-27 NOTE — Care Management Important Message (Signed)
Important Message  Patient Details  Name: Deborah [name] MRN: [id number] Date of Birth: April 08, 1946   Medicare Important Message Given:  Yes  Patient left prior to IM delivery will mail IM to the patient home address.    Kewana Sanon 12/27/2021, 2:06 PM

## 2021-12-27 NOTE — Plan of Care (Signed)
  Problem: Activity: Goal: Risk for activity intolerance will decrease Outcome: Progressing   Problem: Coping: Goal: Level of anxiety will decrease Outcome: Progressing   

## 2021-12-27 NOTE — TOC Transition Note (Signed)
Transition of Care [facility]) - CM/SW Discharge Note   Patient Details  Name: Leisa Gault MRN: [id number] Date of Birth: November 28, 1945  Transition of [facility]) CM/SW Contact:  Carley Hammed, LCSWA Phone Number: 12/27/2021, 10:11 AM   Clinical Narrative:    Pt to be transported to [facility] via [place]. Nurse to call report to [phone]   Final next level of care: Skilled Nursing Facility Barriers to Discharge: Barriers Resolved   Patient Goals and CMS Choice        Discharge Placement              Patient chooses bed at:  [facility]) Patient to be transferred to facility by: [facility] Name of family member notified: Gershon Cull Patient and family notified of of transfer: 12/27/21  Discharge Plan and Services In-house Referral: Clinical Social Work Discharge Planning Services: CM Consult                                 Social Determinants of Health (SDOH) Interventions     Readmission Risk Interventions     No data to display

## 2021-12-27 NOTE — Progress Notes (Signed)
Attempted to call report to [facility] x3.

## 2022-01-03 ENCOUNTER — Encounter (HOSPITAL_COMMUNITY): Payer: Self-pay

## 2022-01-03 ENCOUNTER — Inpatient Hospital Stay (HOSPITAL_COMMUNITY)
Admission: EM | Admit: 2022-01-03 | Discharge: 2022-01-10 | DRG: 689 | Disposition: A | Discharge status: Skilled Nursing Facility | Payer: 59 | Source: Skilled Nursing Facility | Attending: Internal Medicine | Admitting: Internal Medicine

## 2022-01-03 ENCOUNTER — Emergency Department (HOSPITAL_COMMUNITY): Payer: 59

## 2022-01-03 ENCOUNTER — Other Ambulatory Visit: Payer: Self-pay

## 2022-01-03 DIAGNOSIS — Z8673 Personal history of transient ischemic attack (TIA), and cerebral infarction without residual deficits: Secondary | ICD-10-CM

## 2022-01-03 DIAGNOSIS — N3000 Acute cystitis without hematuria: Principal | ICD-10-CM

## 2022-01-03 DIAGNOSIS — Z87891 Personal history of nicotine dependence: Secondary | ICD-10-CM

## 2022-01-03 DIAGNOSIS — G9341 Metabolic encephalopathy: Secondary | ICD-10-CM | POA: Diagnosis present

## 2022-01-03 DIAGNOSIS — I10 Essential (primary) hypertension: Secondary | ICD-10-CM | POA: Diagnosis present

## 2022-01-03 DIAGNOSIS — N390 Urinary tract infection, site not specified: Principal | ICD-10-CM | POA: Diagnosis present

## 2022-01-03 DIAGNOSIS — Z781 Physical restraint status: Secondary | ICD-10-CM

## 2022-01-03 DIAGNOSIS — F319 Bipolar disorder, unspecified: Secondary | ICD-10-CM | POA: Diagnosis present

## 2022-01-03 DIAGNOSIS — M479 Spondylosis, unspecified: Secondary | ICD-10-CM | POA: Diagnosis present

## 2022-01-03 DIAGNOSIS — R410 Disorientation, unspecified: Secondary | ICD-10-CM | POA: Diagnosis not present

## 2022-01-03 DIAGNOSIS — Z800 Family history of malignant neoplasm of digestive organs: Secondary | ICD-10-CM

## 2022-01-03 DIAGNOSIS — D649 Anemia, unspecified: Secondary | ICD-10-CM | POA: Diagnosis present

## 2022-01-03 DIAGNOSIS — F0393 Unspecified dementia, unspecified severity, with mood disturbance: Secondary | ICD-10-CM | POA: Diagnosis present

## 2022-01-03 DIAGNOSIS — Z79899 Other long term (current) drug therapy: Secondary | ICD-10-CM

## 2022-01-03 DIAGNOSIS — B9620 Unspecified Escherichia coli [E. coli] as the cause of diseases classified elsewhere: Secondary | ICD-10-CM | POA: Diagnosis present

## 2022-01-03 DIAGNOSIS — Z681 Body mass index (BMI) 19 or less, adult: Secondary | ICD-10-CM

## 2022-01-03 DIAGNOSIS — E162 Hypoglycemia, unspecified: Secondary | ICD-10-CM | POA: Diagnosis not present

## 2022-01-03 DIAGNOSIS — F05 Delirium due to known physiological condition: Secondary | ICD-10-CM | POA: Diagnosis present

## 2022-01-03 DIAGNOSIS — Z20822 Contact with and (suspected) exposure to covid-19: Secondary | ICD-10-CM | POA: Diagnosis present

## 2022-01-03 DIAGNOSIS — F1011 Alcohol abuse, in remission: Secondary | ICD-10-CM | POA: Diagnosis present

## 2022-01-03 DIAGNOSIS — Z7982 Long term (current) use of aspirin: Secondary | ICD-10-CM

## 2022-01-03 DIAGNOSIS — E43 Unspecified severe protein-calorie malnutrition: Secondary | ICD-10-CM | POA: Diagnosis present

## 2022-01-03 LAB — CBC WITH DIFFERENTIAL/PLATELET
Abs Immature Granulocytes: 0.03 10*3/uL (ref 0.00–0.07)
Basophils Absolute: 0 10*3/uL (ref 0.0–0.1)
Basophils Relative: 0 %
Eosinophils Absolute: 0.1 10*3/uL (ref 0.0–0.5)
Eosinophils Relative: 1 %
HCT: 29.7 % — ABNORMAL LOW (ref 36.0–46.0)
Hemoglobin: 9.7 g/dL — ABNORMAL LOW (ref 12.0–15.0)
Immature Granulocytes: 0 %
Lymphocytes Relative: 16 %
Lymphs Abs: 1.1 10*3/uL (ref 0.7–4.0)
MCH: 31.6 pg (ref 26.0–34.0)
MCHC: 32.7 g/dL (ref 30.0–36.0)
MCV: 96.7 fL (ref 80.0–100.0)
Monocytes Absolute: 0.5 10*3/uL (ref 0.1–1.0)
Monocytes Relative: 7 %
Neutro Abs: 5.2 10*3/uL (ref 1.7–7.7)
Neutrophils Relative %: 76 %
Platelets: 412 10*3/uL — ABNORMAL HIGH (ref 150–400)
RBC: 3.07 MIL/uL — ABNORMAL LOW (ref 3.87–5.11)
RDW: 13.5 % (ref 11.5–15.5)
WBC: 7 10*3/uL (ref 4.0–10.5)
nRBC: 0 % (ref 0.0–0.2)
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
## 2022-01-03 NOTE — ED Notes (Signed)
Pt transported to CT ?

## 2022-01-04 ENCOUNTER — Other Ambulatory Visit: Payer: Self-pay

## 2022-01-04 ENCOUNTER — Inpatient Hospital Stay (HOSPITAL_COMMUNITY): Payer: 59

## 2022-01-04 ENCOUNTER — Encounter (HOSPITAL_COMMUNITY): Payer: Self-pay | Admitting: Emergency Medicine

## 2022-01-04 DIAGNOSIS — Z781 Physical restraint status: Secondary | ICD-10-CM | POA: Diagnosis not present

## 2022-01-04 DIAGNOSIS — Z800 Family history of malignant neoplasm of digestive organs: Secondary | ICD-10-CM | POA: Diagnosis not present

## 2022-01-04 DIAGNOSIS — G9341 Metabolic encephalopathy: Secondary | ICD-10-CM | POA: Diagnosis present

## 2022-01-04 DIAGNOSIS — F319 Bipolar disorder, unspecified: Secondary | ICD-10-CM | POA: Diagnosis present

## 2022-01-04 DIAGNOSIS — F0393 Unspecified dementia, unspecified severity, with mood disturbance: Secondary | ICD-10-CM | POA: Diagnosis present

## 2022-01-04 DIAGNOSIS — Z681 Body mass index (BMI) 19 or less, adult: Secondary | ICD-10-CM | POA: Diagnosis not present

## 2022-01-04 DIAGNOSIS — Z8673 Personal history of transient ischemic attack (TIA), and cerebral infarction without residual deficits: Secondary | ICD-10-CM | POA: Diagnosis not present

## 2022-01-04 DIAGNOSIS — M479 Spondylosis, unspecified: Secondary | ICD-10-CM | POA: Diagnosis present

## 2022-01-04 DIAGNOSIS — F05 Delirium due to known physiological condition: Secondary | ICD-10-CM | POA: Diagnosis present

## 2022-01-04 DIAGNOSIS — Z7982 Long term (current) use of aspirin: Secondary | ICD-10-CM | POA: Diagnosis not present

## 2022-01-04 DIAGNOSIS — F1011 Alcohol abuse, in remission: Secondary | ICD-10-CM | POA: Diagnosis present

## 2022-01-04 DIAGNOSIS — Z87891 Personal history of nicotine dependence: Secondary | ICD-10-CM | POA: Diagnosis not present

## 2022-01-04 DIAGNOSIS — I10 Essential (primary) hypertension: Secondary | ICD-10-CM | POA: Diagnosis present

## 2022-01-04 DIAGNOSIS — E43 Unspecified severe protein-calorie malnutrition: Secondary | ICD-10-CM | POA: Diagnosis present

## 2022-01-04 DIAGNOSIS — E162 Hypoglycemia, unspecified: Secondary | ICD-10-CM | POA: Diagnosis not present

## 2022-01-04 DIAGNOSIS — Z20822 Contact with and (suspected) exposure to covid-19: Secondary | ICD-10-CM | POA: Diagnosis present

## 2022-01-04 DIAGNOSIS — D649 Anemia, unspecified: Secondary | ICD-10-CM | POA: Diagnosis present

## 2022-01-04 DIAGNOSIS — B9620 Unspecified Escherichia coli [E. coli] as the cause of diseases classified elsewhere: Secondary | ICD-10-CM | POA: Diagnosis present

## 2022-01-04 DIAGNOSIS — Z79899 Other long term (current) drug therapy: Secondary | ICD-10-CM | POA: Diagnosis not present

## 2022-01-04 DIAGNOSIS — N390 Urinary tract infection, site not specified: Principal | ICD-10-CM | POA: Diagnosis present

## 2022-01-04 DIAGNOSIS — R410 Disorientation, unspecified: Secondary | ICD-10-CM | POA: Diagnosis present

## 2022-01-04 LAB — COMPREHENSIVE METABOLIC PANEL
ALT: 14 U/L (ref 0–44)
ALT: 15 U/L (ref 0–44)
AST: 16 U/L (ref 15–41)
AST: 17 U/L (ref 15–41)
Albumin: 3.2 g/dL — ABNORMAL LOW (ref 3.5–5.0)
Albumin: 3.5 g/dL (ref 3.5–5.0)
Alkaline Phosphatase: 69 U/L (ref 38–126)
Alkaline Phosphatase: 75 U/L (ref 38–126)
Anion gap: 6 (ref 5–15)
Anion gap: 8 (ref 5–15)
BUN: 26 mg/dL — ABNORMAL HIGH (ref 8–23)
BUN: 34 mg/dL — ABNORMAL HIGH (ref 8–23)
CO2: 26 mmol/L (ref 22–32)
CO2: 27 mmol/L (ref 22–32)
Calcium: 8.9 mg/dL (ref 8.9–10.3)
Calcium: 9.2 mg/dL (ref 8.9–10.3)
Chloride: 107 mmol/L (ref 98–111)
Chloride: 110 mmol/L (ref 98–111)
Creatinine, Ser: 0.78 mg/dL (ref 0.44–1.00)
Creatinine, Ser: 0.97 mg/dL (ref 0.44–1.00)
GFR, Estimated: >60 mL/min (ref >60)
GFR, Estimated: >60 mL/min (ref >60)
Glucose, Bld: 113 mg/dL — ABNORMAL HIGH (ref 70–99)
Glucose, Bld: 86 mg/dL (ref 70–99)
Potassium: 3.6 mmol/L (ref 3.5–5.1)
Potassium: 4 mmol/L (ref 3.5–5.1)
Sodium: 142 mmol/L (ref 135–145)
Sodium: 142 mmol/L (ref 135–145)
Total Bilirubin: 0.4 mg/dL (ref 0.3–1.2)
Total Bilirubin: 0.5 mg/dL (ref 0.3–1.2)
Total Protein: 6.5 g/dL (ref 6.5–8.1)
Total Protein: 7 g/dL (ref 6.5–8.1)

## 2022-01-04 LAB — RAPID URINE DRUG SCREEN, HOSP PERFORMED
Amphetamines: NOT DETECTED
Barbiturates: NOT DETECTED
Benzodiazepines: POSITIVE — AB
Cocaine: NOT DETECTED
Opiates: NOT DETECTED
Tetrahydrocannabinol: NOT DETECTED

## 2022-01-04 LAB — FERRITIN: Ferritin: 59 ng/mL (ref 11–307)

## 2022-01-04 LAB — RETICULOCYTES
Immature Retic Fract: 10.8 % (ref 2.3–15.9)
RBC.: 3.03 MIL/uL — ABNORMAL LOW (ref 3.87–5.11)
Retic Count, Absolute: 31.5 10*3/uL (ref 19.0–186.0)
Retic Ct Pct: 1 % (ref 0.4–3.1)

## 2022-01-04 LAB — CBC
HCT: 29.8 % — ABNORMAL LOW (ref 36.0–46.0)
Hemoglobin: 9.8 g/dL — ABNORMAL LOW (ref 12.0–15.0)
MCH: 31.9 pg (ref 26.0–34.0)
MCHC: 32.9 g/dL (ref 30.0–36.0)
MCV: 97.1 fL (ref 80.0–100.0)
Platelets: 340 10*3/uL (ref 150–400)
RBC: 3.07 MIL/uL — ABNORMAL LOW (ref 3.87–5.11)
RDW: 13.5 % (ref 11.5–15.5)
WBC: 6.1 10*3/uL (ref 4.0–10.5)
nRBC: 0 % (ref 0.0–0.2)

## 2022-01-04 LAB — FOLATE: Folate: 11.3 ng/mL (ref >5.9)

## 2022-01-04 LAB — URINALYSIS, ROUTINE W REFLEX MICROSCOPIC
Bilirubin Urine: NEGATIVE
Glucose, UA: NEGATIVE mg/dL
Ketones, ur: NEGATIVE mg/dL
Nitrite: POSITIVE — AB
Protein, ur: NEGATIVE mg/dL
Specific Gravity, Urine: 1.018 (ref 1.005–1.030)
pH: 5 (ref 5.0–8.0)

## 2022-01-04 LAB — TROPONIN I (HIGH SENSITIVITY)
Troponin I (High Sensitivity): 16 ng/L (ref <18)
Troponin I (High Sensitivity): 18 ng/L — ABNORMAL HIGH (ref <18)
Troponin I (High Sensitivity): 18 ng/L — ABNORMAL HIGH (ref <18)

## 2022-01-04 LAB — ETHANOL: Alcohol, Ethyl (B): <10 mg/dL (ref <10)

## 2022-01-04 LAB — MRSA NEXT GEN BY PCR, NASAL: MRSA by PCR Next Gen: NOT DETECTED

## 2022-01-04 LAB — AMMONIA: Ammonia: 32 umol/L (ref 9–35)

## 2022-01-04 LAB — IRON AND TIBC
Iron: 30 ug/dL (ref 28–170)
Saturation Ratios: 12 % (ref 10.4–31.8)
TIBC: 259 ug/dL (ref 250–450)
UIBC: 229 ug/dL

## 2022-01-04 LAB — GLUCOSE, CAPILLARY
Glucose-Capillary: 87 mg/dL (ref 70–99)
Glucose-Capillary: 88 mg/dL (ref 70–99)
Glucose-Capillary: 99 mg/dL (ref 70–99)

## 2022-01-04 LAB — VITAMIN B12: Vitamin B-12: 246 pg/mL (ref 180–914)

## 2022-01-04 LAB — SARS CORONAVIRUS 2 BY RT PCR: SARS Coronavirus 2 by RT PCR: NEGATIVE

## 2022-01-04 LAB — MAGNESIUM: Magnesium: 2.2 mg/dL (ref 1.7–2.4)

## 2022-01-04 MED ORDER — ACETAMINOPHEN 650 MG RE SUPP: 650.0000 mg | Freq: Four times a day (QID) | RECTAL | Status: DC | PRN

## 2022-01-04 MED ORDER — ONDANSETRON HCL 4 MG/2ML IJ SOLN: 4.0000 mg | Freq: Four times a day (QID) | INTRAMUSCULAR | Status: DC | PRN

## 2022-01-04 MED ORDER — SODIUM CHLORIDE 0.9 % IV SOLN
1.0000 g | INTRAVENOUS | Status: DC
  Administered 2022-01-05 – 2022-01-06 (×2): 1 g via INTRAVENOUS
  Filled 2022-01-04 (×2): qty 10

## 2022-01-04 MED ORDER — SENNOSIDES-DOCUSATE SODIUM 8.6-50 MG PO TABS
1.0000 | ORAL_TABLET | Freq: Every evening | ORAL | Status: DC | PRN
  Administered 2022-01-07: 1 via ORAL
  Filled 2022-01-04: qty 1

## 2022-01-04 MED ORDER — ASPIRIN 325 MG PO TABS
325.0000 mg | ORAL_TABLET | Freq: Every day | ORAL | Status: DC
  Administered 2022-01-04 – 2022-01-10 (×7): 325 mg via ORAL
  Filled 2022-01-04 (×7): qty 1

## 2022-01-04 MED ORDER — ALBUTEROL SULFATE (2.5 MG/3ML) 0.083% IN NEBU: 2.5000 mg | INHALATION_SOLUTION | RESPIRATORY_TRACT | Status: DC | PRN

## 2022-01-04 MED ORDER — ROSUVASTATIN CALCIUM 10 MG PO TABS
10.0000 mg | ORAL_TABLET | Freq: Every day | ORAL | Status: DC
  Administered 2022-01-04 – 2022-01-10 (×7): 10 mg via ORAL
  Filled 2022-01-04 (×7): qty 1

## 2022-01-04 MED ORDER — LISINOPRIL 20 MG PO TABS
20.0000 mg | ORAL_TABLET | Freq: Every day | ORAL | Status: DC
  Administered 2022-01-04 – 2022-01-10 (×7): 20 mg via ORAL
  Filled 2022-01-04 (×7): qty 1

## 2022-01-04 MED ORDER — OLANZAPINE 10 MG PO TABS
10.0000 mg | ORAL_TABLET | Freq: Every day | ORAL | Status: DC
Start: 2022-01-04 — End: 2022-01-06
  Administered 2022-01-04 – 2022-01-05 (×2): 10 mg via ORAL
  Filled 2022-01-04 (×2): qty 1

## 2022-01-04 MED ORDER — NICOTINE 21 MG/24HR TD PT24
21.0000 mg | MEDICATED_PATCH | Freq: Every day | TRANSDERMAL | Status: DC
Start: 2022-01-04 — End: 2022-01-10
  Administered 2022-01-04 – 2022-01-10 (×7): 21 mg via TRANSDERMAL
  Filled 2022-01-04 (×7): qty 1

## 2022-01-04 MED ORDER — ACETAMINOPHEN 325 MG PO TABS: 650.0000 mg | ORAL_TABLET | Freq: Four times a day (QID) | ORAL | Status: DC | PRN

## 2022-01-04 MED ORDER — ONDANSETRON HCL 4 MG PO TABS: 4.0000 mg | ORAL_TABLET | Freq: Four times a day (QID) | ORAL | Status: DC | PRN

## 2022-01-04 MED ORDER — LORAZEPAM 0.5 MG PO TABS
0.5000 mg | ORAL_TABLET | Freq: Four times a day (QID) | ORAL | Status: DC | PRN
  Administered 2022-01-04 – 2022-01-06 (×3): 0.5 mg via ORAL
  Filled 2022-01-04 (×3): qty 1

## 2022-01-04 MED ORDER — MEMANTINE HCL 10 MG PO TABS
5.0000 mg | ORAL_TABLET | Freq: Two times a day (BID) | ORAL | Status: DC
  Administered 2022-01-04 – 2022-01-10 (×13): 5 mg via ORAL
  Filled 2022-01-04 (×13): qty 1

## 2022-01-04 MED ORDER — ENSURE ENLIVE PO LIQD
237.0000 mL | Freq: Three times a day (TID) | ORAL | Status: DC
  Administered 2022-01-04 – 2022-01-10 (×12): 237 mL via ORAL
  Filled 2022-01-04: qty 237

## 2022-01-04 MED ORDER — FLUOXETINE HCL 20 MG PO CAPS
20.0000 mg | ORAL_CAPSULE | Freq: Every day | ORAL | Status: DC
  Administered 2022-01-04 – 2022-01-10 (×7): 20 mg via ORAL
  Filled 2022-01-04 (×7): qty 1

## 2022-01-04 MED ORDER — VITAMIN D 25 MCG (1000 UNIT) PO TABS
1000.0000 [IU] | ORAL_TABLET | Freq: Every day | ORAL | Status: DC
  Administered 2022-01-04 – 2022-01-10 (×7): 1000 [IU] via ORAL
  Filled 2022-01-04 (×7): qty 1

## 2022-01-04 MED ORDER — ORAL CARE MOUTH RINSE: 15.0000 mL | OROMUCOSAL | Status: DC | PRN

## 2022-01-04 MED ORDER — AMLODIPINE BESYLATE 5 MG PO TABS
5.0000 mg | ORAL_TABLET | Freq: Every day | ORAL | Status: DC
  Administered 2022-01-04 – 2022-01-10 (×7): 5 mg via ORAL
  Filled 2022-01-04 (×7): qty 1

## 2022-01-04 MED ORDER — SODIUM CHLORIDE 0.9 % IV SOLN
1.0000 g | Freq: Once | INTRAVENOUS | Status: AC
  Administered 2022-01-04: 1 g via INTRAVENOUS
  Filled 2022-01-04: qty 10

## 2022-01-04 MED ORDER — SODIUM CHLORIDE 0.9 % IV SOLN: INTRAVENOUS | Status: AC

## 2022-01-04 NOTE — ED Notes (Signed)
While preparing to in and out cath pt, pt tells staff she needs to pee. Bedpan placed under pt and she urinated approx of urine. Pt provided with washcloth for peri care. Brief and purewick applied.

## 2022-01-04 NOTE — TOC Initial Note (Signed)
Transition of Care [facility]) - Initial/Assessment Note    Patient Details  Name: Deborah [name] MRN: [id number] Date of Birth: 1945-07-07  [facility]) CM/SW Contact:    Otelia Santee, LCSW Phone Number: 01/04/2022, 10:40 AM  Clinical Narrative:                 Pt coming from [facility]. Pt is able to return to SNF at discharge. TOC will continue to follow for further recommendations and discharge needs.   Expected Discharge Plan: Skilled Nursing Facility Barriers to Discharge: No Barriers Identified   Patient Goals and CMS Choice Patient states their goals for this hospitalization and ongoing recovery are:: To return to SNF   Choice offered to / list presented to : Patient, [facility] POA / Guardian  Expected Discharge Plan and Services Expected Discharge Plan: Skilled Nursing Facility In-house Referral: Clinical Social Work Discharge Planning Services: CM Consult Post Acute Care Choice: Skilled Nursing Facility                   DME Arranged: N/A DME Agency: NA                  Prior Living Arrangements/Services   Lives with:: Facility Resident Patient language and need for interpreter reviewed:: Yes Do you feel safe going back to the place where you live?: Yes      Need for Family Participation in Patient Care: Yes (Comment) Care giver support system in place?: Yes (comment)   Criminal Activity/Legal Involvement Pertinent to Current Situation/Hospitalization: No - Comment as needed  Activities of Daily Living Home Assistive Devices/Equipment: None ADL Screening (condition at time of admission) Patient's cognitive ability adequate to safely complete daily activities?: No Is the patient deaf or have difficulty hearing?: No Does the patient have difficulty seeing, even when wearing glasses/contacts?: No Does the patient have difficulty concentrating, remembering, or making decisions?: Yes Patient able to express need for assistance with ADLs?:  Yes Does the patient have difficulty dressing or bathing?: Yes Independently performs ADLs?: No Communication: Needs assistance Is this a change from baseline?: Pre-admission baseline Dressing (OT): Needs assistance Is this a change from baseline?: Pre-admission baseline Grooming: Needs assistance Is this a change from baseline?: Pre-admission baseline Feeding: Needs assistance Is this a change from baseline?: Pre-admission baseline Bathing: Needs assistance Is this a change from baseline?: Pre-admission baseline Toileting: Needs assistance Is this a change from baseline?: Pre-admission baseline In/Out Bed: Needs assistance Is this a change from baseline?: Pre-admission baseline Walks in Home: Independent Does the patient have difficulty walking or climbing stairs?: Yes Weakness of Legs: Both Weakness of Arms/Hands: Both  Permission Sought/Granted                  Emotional Assessment       Orientation: : Oriented to Self, Oriented to Place Alcohol / Substance Use: Not Applicable Psych Involvement: No (comment)  Admission diagnosis:  Delirium [R41.0] UTI (urinary tract infection) [N39.0] Acute cystitis without hematuria [N30.00] Patient Active Problem List   Diagnosis Date Noted   UTI (urinary tract infection) 01/04/2022   Protein-calorie malnutrition, severe 12/20/2021   Mood disorder (HCC) 12/14/2021   Cerebrovascular accident (CVA) of left thalamus (HCC) 12/09/2021   Substance abuse (HCC)    Acute metabolic encephalopathy 12/08/2021   Syncope 03/29/2019   Loss of consciousness (HCC) 07/06/2017   Loss of weight 04/30/2016   Lower back pain 03/04/2016   Atopic dermatitis 10/10/2015   History of CVA (  cerebrovascular accident) 09/05/2015   Health maintenance examination 10/29/2011   Primary osteoarthritis of both knees 02/28/2011   Depression 08/10/2009   DJD (degenerative joint disease) 07/03/2006   Essential hypertension 06/08/2006   ASTHMA 06/08/2006    TAH/BSO, HX OF 06/08/2006   PCP:  Oneita Hurt No Pharmacy:   [facility] - [place], [place] - [address] [place] [address] Phone: [phone] Fax: [phone]     Social Determinants of Health (SDOH) Interventions    Readmission Risk Interventions    01/04/2022   10:37 AM  Readmission Risk Prevention Plan  Transportation Screening Complete  PCP or Specialist Appt within 3-5 Days Complete  HRI or Home Care Consult Complete  Social Work Consult for Recovery Care Planning/Counseling Complete  Palliative Care Screening Not Applicable  Medication Review [employer]) Complete
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
## 2022-01-04 NOTE — Plan of Care (Signed)

## 2022-01-04 NOTE — ED Notes (Signed)
Called lab to add troponin to lab work previously sent. Molly (Designer, industrial/product) advised she will run it.

## 2022-01-04 NOTE — ED Notes (Incomplete)
Called lab and spoke with Aundra Millet who advised they will run urine culture and magnesium.

## 2022-01-04 NOTE — Progress Notes (Signed)
Patient was seen and examined at the bedside.  She was awakened from sleep.  She has no complaints.  She is oriented to person only.  Vital signs are stable.  Continue empiric IV Rocephin for acute UTI.  Follow-up urine culture.

## 2022-01-04 NOTE — H&P (Addendum)
History and Physical    Deborah [name] JKK:[id number] DOB: [birth year] DOA: 01/03/2022  PCP: Pcp, No  Patient coming from: NH   I have personally briefly reviewed patient's old medical records in [facility]  Chief Complaint: severe   HPI: Deborah [name] is a 76 y.o. female with medical history significant of  anxiety, asthma, cocaine use, history of CVA, HTN, tobacco use, alcohol use who has interim history of admission 12/08/21-7/723 with discharge diagnosis of acute metabolic encephalopathy due to substance abuse in setting of new   CVA of left thalamus. Patient during this hospitalization was also noted to have behavioral disturbances labeled as mood disorder nos vs substance induced psych d/o. Due to patient behavioral disturbances and  mental status, patient was transitioned to supervised setting on discharge. Patient now presents to ED BIBEMS from NH due to increase agitation  and change in MS at [facility].  Of note in the filed patient per EMS was noted to be combative and as medicated for transport with good effect. Patient currently lethargic only alert to self and place.  Patient is unable to give history at this history is taken from chart review. Of note on partial ros patient noted no pain , sob .  ED Course:  Vitals:  Afeb, bp 124/79, hr 85, sat 99% on ra  EKG nsr Lae, LVH Wbc 7, hgb 9.7 ( 11.2), plt 412 Etoh < 10 Na 142, K 4, glu 113, cr 0.97 Ammonia 32,  CE 16 UA: + LE , nitrate  , +bacteria, wbc 21-50 UDS:+benzo Covid neg CTH IMPRESSION: 1. No acute intracranial abnormality. 2. Expected evolution of lacunar infarct in the left thalamus from prior CT. Stable brain volume loss and chronic small vessel ischemia.  Tx ctx 1gram Review of Systems: unable to fully obtain due to  pain mental status   Past Medical History:  Diagnosis Date   Alcohol abuse, in remission    Anxiety    Asthma    Cocaine abuse (HCC)    CVA (cerebral vascular accident) (HCC)    Degenerative  joint disease    back   Fracture of hand    Headache, chronic daily    Hematuria    Hypertension    Inadequate material resources    Mental/behavioral problem    S/P TAH-BSO    Tobacco abuse    Trichomonal vaginitis     Past Surgical History:  Procedure Laterality Date   ABDOMINAL HYSTERECTOMY  04/13/1989   Per Dr. Lilian Kapur. Abdominal hysterectomy and bilateral salpingo-oophorectomy   FOOT SURGERY       reports that she quit smoking about 10 years ago. Her smoking use included cigarettes. She smoked an average of .1 packs per day. She has quit using smokeless tobacco. She reports that she does not drink alcohol and does not use drugs.  No Known Allergies  Family History  Problem Relation Age of Onset   Cancer Father        colon cancer   Cancer Paternal Aunt        colon cancer   Breast cancer Neg Hx     Prior to Admission medications   Medication Sig Start Date End Date Taking? Authorizing Provider  amLODipine (NORVASC) 5 MG tablet Take 1 tablet (5 mg total) by mouth daily. 12/28/21  Yes Tyrone Nine, MD  aspirin 325 MG tablet Take 1 tablet (325 mg total) by mouth daily. 12/28/21  Yes Tyrone Nine, MD  diphenhydrAMINE (BENADRYL) 25 mg capsule  Take 25 mg by mouth every 6 (six) hours as needed for itching.   Yes [provider]  feeding supplement (ENSURE ENLIVE / ENSURE PLUS) LIQD Take 237 mLs by mouth 3 (three) times daily between meals. 12/27/21  Yes Tyrone Nine, MD  FLUoxetine (PROZAC) 20 MG capsule Take 20 mg by mouth daily. 11/26/20  Yes [provider]  lisinopril (ZESTRIL) 20 MG tablet Take 1 tablet (20 mg total) by mouth daily. 12/28/21  Yes Tyrone Nine, MD  LORazepam (ATIVAN) 0.5 MG tablet Take 1 tablet (0.5 mg total) by mouth every 6 (six) hours as needed (agitation). 12/27/21  Yes Tyrone Nine, MD  melatonin 3 MG TABS tablet Take 3 mg by mouth at bedtime as needed (sleep).   Yes [provider]  memantine (NAMENDA) 5 MG tablet Take 5 mg by  mouth 2 (two) times daily. 08/28/21  Yes [provider]  nicotine (NICODERM CQ - DOSED IN MG/24 HOURS) 21 mg/24hr patch Place 1 patch (21 mg total) onto the skin daily. 12/28/21  Yes Tyrone Nine, MD  OLANZapine (ZYPREXA) 10 MG tablet Take 1 tablet (10 mg total) by mouth at bedtime. 12/27/21  Yes Tyrone Nine, MD  rosuvastatin (CRESTOR) 10 MG tablet Take 1 tablet (10 mg total) by mouth daily. 12/28/21  Yes Tyrone Nine, MD  Vitamin D, Cholecalciferol, 25 MCG (1000 UT) TABS Take 1 tablet by mouth daily. 02/08/21  Yes [provider]    Physical Exam: Vitals:   01/03/22 2338 01/04/22 0050 01/04/22 0100 01/04/22 0130  BP:   105/86 120/69  Pulse:   83 75  Resp: 12  16 17   Temp: 97.6 F (36.4 C) (!) 97 F (36.1 C)    TempSrc: Axillary Rectal    SpO2:   100% 99%     Vitals:   01/03/22 2338 01/04/22 0050 01/04/22 0100 01/04/22 0130  BP:   105/86 120/69  Pulse:   83 75  Resp: 12  16 17   Temp: 97.6 F (36.4 C) (!) 97 F (36.1 C)    TempSrc: Axillary Rectal    SpO2:   100% 99%  Constitutional: NAD, calm, comfortable Eyes: PERRL, lids and conjunctivae normal ENMT: Mucous membranes are dry. Posterior pharynx clear of any exudate or lesions.Normal dentition.  Neck: normal, supple, no masses, no thyromegaly Respiratory: clear to auscultation bilaterally, no wheezing, no crackles. Normal respiratory effort. No accessory muscle use.  Cardiovascular: Regular rate and rhythm, no murmurs / rubs / gallops. No extremity edema. 2+ pedal pulses.  Abdomen: no tenderness, no masses palpated. No hepatosplenomegaly. Bowel sounds positive.  Musculoskeletal: no clubbing / cyanosis. No joint deformity upper and lower extremities. Good ROM, no contractures. Normal muscle tone.  Skin: no rashes, lesions, ulcers. No induration Neurologic: CN 2-12 grossly intact. Sensation intact,  MAE Psychiatric: alert to self and place   Labs on Admission: I have personally reviewed following labs and  imaging studies  CBC: Recent Labs  Lab 01/03/22 2338  WBC 7.0  NEUTROABS 5.2  HGB 9.7*  HCT 29.7*  MCV 96.7  PLT 412*   Basic Metabolic Panel: Recent Labs  Lab 01/03/22 2338  NA 142  K 4.0  CL 107  CO2 27  GLUCOSE 113*  BUN 34*  CREATININE 0.97  CALCIUM 8.9   GFR: Estimated Creatinine Clearance: 41.7 mL/min (by C-G formula based on SCr of 0.97 mg/dL). Liver Function Tests: Recent Labs  Lab 01/03/22 2338  AST 16  ALT 15  ALKPHOS 75  BILITOT 0.4  PROT 7.0  ALBUMIN 3.5   No results for input(s): "LIPASE", "AMYLASE" in the last 168 hours. Recent Labs  Lab 01/03/22 2338  AMMONIA 32   Coagulation Profile: No results for input(s): "INR", "PROTIME" in the last 168 hours. Cardiac Enzymes: No results for input(s): "CKTOTAL", "CKMB", "CKMBINDEX", "TROPONINI" in the last 168 hours. BNP (last 3 results) No results for input(s): "PROBNP" in the last 8760 hours. HbA1C: No results for input(s): "HGBA1C" in the last 72 hours. CBG: No results for input(s): "GLUCAP" in the last 168 hours. Lipid Profile: No results for input(s): "CHOL", "HDL", "LDLCALC", "TRIG", "CHOLHDL", "LDLDIRECT" in the last 72 hours. Thyroid Function Tests: No results for input(s): "TSH", "T4TOTAL", "FREET4", "T3FREE", "THYROIDAB" in the last 72 hours. Anemia Panel: No results for input(s): "VITAMINB12", "FOLATE", "FERRITIN", "TIBC", "IRON", "RETICCTPCT" in the last 72 hours. Urine analysis:    Component Value Date/Time   COLORURINE YELLOW 01/04/2022 0053   APPEARANCEUR HAZY (A) 01/04/2022 0053   LABSPEC 1.018 01/04/2022 0053   PHURINE 5.0 01/04/2022 0053   GLUCOSEU NEGATIVE 01/04/2022 0053   HGBUR MODERATE (A) 01/04/2022 0053   BILIRUBINUR NEGATIVE 01/04/2022 0053   KETONESUR NEGATIVE 01/04/2022 0053   PROTEINUR NEGATIVE 01/04/2022 0053   UROBILINOGEN 0.2 11/10/2010 1247   NITRITE POSITIVE (A) 01/04/2022 0053   LEUKOCYTESUR LARGE (A) 01/04/2022 0053    Radiological Exams on  Admission: CT Head Wo Contrast  Result Date: 01/04/2022 CLINICAL DATA:  Delirium. EXAM: CT HEAD WITHOUT CONTRAST TECHNIQUE: Contiguous axial images were obtained from the base of the skull through the vertex without intravenous contrast. RADIATION DOSE REDUCTION: This exam was performed according to the departmental dose-optimization program which includes automated exposure control, adjustment of the mA and/or kV according to patient size and/or use of iterative reconstruction technique. COMPARISON:  CT 12/08/2021, MRI 06/19/20233 FINDINGS: Brain: No acute intracranial hemorrhage. Expected evolution of lacunar infarct in the left thalamus from prior CT. Stable brain volume. Advanced periventricular and deep white matter hypodensity consistent with chronic small vessel ischemia. No hydrocephalus, midline shift, or mass effect. No subdural or extra-axial collection. Vascular: Atherosclerosis of skullbase vasculature without hyperdense vessel or abnormal calcification. Skull: No fracture or focal lesion. Sinuses/Orbits: Minor mucosal thickening of ethmoid air cells. No sinus fluid levels. No mastoid effusion. Other: None. IMPRESSION: 1. No acute intracranial abnormality. 2. Expected evolution of lacunar infarct in the left thalamus from prior CT. Stable brain volume loss and chronic small vessel ischemia. Electronically Signed   By: Narda Rutherford M.D.   On: 01/04/2022 00:05    EKG: Independently reviewed. See above  Assessment/Plan UTI -admit to tele  -start CTX  -f/u on culture  Recurrent acute Metabolic encephalopathy  -in background hx of substance abuse, and recent CVA, and ? Psych d/o nos -hold sedative medications as able  -consider psych /neuro psych consult once UTI has been treated  -place on one to one due to behavioral disturbances   Psych d/o nos with behavioral disturbance   Hx of polysubstance abuse  - Continue zyprexa,SSRI  -tele sitter  -attempt to limit sedative medications  as able    Abn CE  -abn ekg  -cycle ce  -patent no cardiac pain but is poor historian  -continue to monitor   Anemia  -noted drop in hbg from  11.2 to 9.7 -check anemia labs, as well as fob  -monitor h/h   Hx of recent CVA left thalamus -continue asa , high dose statin  Essential hypertension - Continue amlodipine, lisinopril     Substance abuse hx  -in NH x 1 weeks -UDS benzo , which was given to patient     Hx  of Pericardial effusion -no noted clinical signs of  of tamponade.  -note abn ekg  -repeat EKG  -cxr for baseline -monitor on tele as able        DVT prophylaxis: scd due to lower h/h Code Status: full Family Communication: Note at bedside Disposition Plan: patient  expected to be admitted greater than 2 midnights  Consults called: n/a Admission status: cardiac tele   Lurline Del MD [facility]   If 7PM-7AM, please contact night-coverage [email or web address] Password [facility]  01/04/2022, 2:33 AM

## 2022-01-05 DIAGNOSIS — N390 Urinary tract infection, site not specified: Secondary | ICD-10-CM | POA: Diagnosis not present

## 2022-01-05 LAB — GLUCOSE, CAPILLARY: Glucose-Capillary: 82 mg/dL (ref 70–99)

## 2022-01-05 MED ORDER — HYDRALAZINE HCL 20 MG/ML IJ SOLN
5.0000 mg | INTRAMUSCULAR | Status: DC | PRN
  Administered 2022-01-05: 5 mg via INTRAVENOUS
  Filled 2022-01-05: qty 1

## 2022-01-05 NOTE — Progress Notes (Signed)
Initial Nutrition Assessment  DOCUMENTATION CODES:   Severe malnutrition in context of social or environmental circumstances  INTERVENTION:   Ensure Enlive po TID, each supplement provides 350 kcal and 20 grams of protein.  MVI po daily   Liberalize diet   Pt at high refeed risk; recommend monitor potassium, magnesium and phosphorus labs daily until stable  NUTRITION DIAGNOSIS:   Severe Malnutrition related to social / environmental circumstances (mood disorder, substance abuse) as evidenced by severe fat depletion, severe muscle depletion.  -based on exam form 6/30  GOAL:   Patient will meet greater than or equal to 90% of their needs  MONITOR:   PO intake, Supplement acceptance, Labs, Weight trends, Skin, I & O's  REASON FOR ASSESSMENT:   Malnutrition Screening Tool    ASSESSMENT:   76 y.o. female with medical history significant of anxiety, asthma, cocaine use, DJD, CVA, HTN, tobacco use, alcohol use, recent admission 12/08/21-7/723 with acute metabolic encephalopathy found to have CVA of left thalamus discharged to SNF, behavioral disturbances labeled as mood disorder and who is now admitted with AMS, agitation and UTI.  RD working remotely.  Unable to reach pt by phone. Pt is known to the nutrition department from a recent previous admission. Pt eating fairly well prior to her last discharge and was drinking Ensure supplements. Pt documented to be eating 100% of meals in hospital. Recommend continue Ensure supplements. RD will add daily MVI and liberalize pt's diet. Per chart, pt appears weight stable at baseline. RD will obtain nutrition related exam and history at follow up.   Medications reviewed and include: aspirin, vitamin D3, nicotine, ceftriaxone   Labs reviewed: K 3.6 wnl, Mg 2.2 wnl Hgb 9.8(L), Hct 29.8(L) Cbgs- 82, 99 x 48 hrs  NUTRITION - FOCUSED PHYSICAL EXAM: Unable to perform at this time   Diet Order:   Diet Order             Diet Heart Room  service appropriate? Yes; Fluid consistency: Thin  Diet effective now                  EDUCATION NEEDS:   No education needs have been identified at this time  Skin:  Skin Assessment: Reviewed RN Assessment  Last BM:  7/14  Height:   Ht Readings from Last 1 Encounters:  01/04/22 5\' 6"  (1.676 m)    Weight:   Wt Readings from Last 1 Encounters:  01/05/22 59.6 kg    Ideal Body Weight:  59 kg  BMI:  Body mass index is 21.21 kg/m.  Estimated Nutritional Needs:   Kcal:  1600-1800kcal/day  Protein:  80-90g/day  Fluid:  1.5-1.7L/day  01/07/22 MS, RD, LDN Please refer to [facility] for RD and/or RD on-call/weekend/after hours pager

## 2022-01-05 NOTE — Plan of Care (Signed)

## 2022-01-05 NOTE — Progress Notes (Signed)
Progress Note    Deborah [name]  BPZ:[id number] DOB: 1945-11-26  DOA: 01/03/2022 PCP: Pcp, No      Brief Narrative:    Medical records reviewed and are as summarized below:  Deborah [name] is a 76 y.o. female with medical history significant for  anxiety, asthma, cocaine use, history of CVA, HTN, tobacco use, alcohol use who has interim history of admission 12/08/21-7/723 with discharge diagnosis of acute metabolic encephalopathy due to substance abuse in setting of new   CVA of left thalamus. Patient during this hospitalization was also noted to have behavioral disturbances labeled as mood disorder nos vs substance induced psych d/o. Due to patient behavioral disturbances and  mental status, patient was transitioned to supervised setting on discharge.  She was brought back from the nursing facility on the same day of discharge because of aggressive behavior and change in mental status.  Apparently, she was combative with EMS.   She was admitted to the hospital for acute UTI and acute metabolic encephalopathy.  She was treated with empiric IV ceftriaxone.     Assessment/Plan:   Principal Problem:   UTI (urinary tract infection)   Nutrition Problem: Severe Malnutrition Etiology: social / environmental circumstances (mood disorder, substance abuse)  Signs/Symptoms: severe fat depletion, severe muscle depletion   Body mass index is 21.21 kg/m.   Acute UTI: Urine culture is growing E. coli.  Continue IV ceftriaxone.  Follow-up urine culture sensitivity report.  Acute metabolic encephalopathy, delirium/visual hallucination in a patient with recent left thalamic stroke, psychiatric disorder NOS and cocaine use disorder: continue supportive care.  Continue aspirin and rosuvastatin.  Continue memantine and Zyprexa.  Abnormal EKG, LVH with secondary repolarization changes, minimally elevated troponins: No chest pain.  Minimal elevated troponins is likely from demand  ischemia.  Other comorbidities include hypertension, asthma, tobacco use disorder  Diet Order             Diet regular Room service appropriate? Yes; Fluid consistency: Thin  Diet effective now                            Consultants: None  Procedures: None    Medications:    amLODipine  5 mg Oral Daily   aspirin  325 mg Oral Daily   cholecalciferol  1,000 Units Oral Daily   feeding supplement  237 mL Oral TID BM   FLUoxetine  20 mg Oral Daily   lisinopril  20 mg Oral Daily   memantine  5 mg Oral BID   nicotine  21 mg Transdermal Daily   OLANZapine  10 mg Oral QHS   rosuvastatin  10 mg Oral Daily   Continuous Infusions:  cefTRIAXone (ROCEPHIN)  IV 1 g (01/05/22 0159)     Anti-infectives (From admission, onward)    Start     Dose/Rate Route Frequency Ordered Stop   01/05/22 0200  cefTRIAXone (ROCEPHIN) 1 g in sodium chloride 0.9 % 100 mL IVPB        1 g 200 mL/hr over 30 Minutes Intravenous Every 24 hours 01/04/22 0343 01/10/22 0159   01/04/22 0200  cefTRIAXone (ROCEPHIN) 1 g in sodium chloride 0.9 % 100 mL IVPB        1 g 200 mL/hr over 30 Minutes Intravenous  Once 01/04/22 0156 01/04/22 0257              Family Communication/Anticipated D/C date and plan/Code Status   DVT prophylaxis: SCDs  Start: 01/04/22 [id number]     Code Status: Full Code  Family Communication: None Disposition Plan: Plan to discharge home   Status is: Inpatient Remains inpatient appropriate because: Confusion, on IV antibiotics for UTI       Subjective:   Interval events noted.  She is confused and cannot provide adequate history.  Patient has some visual hallucinations.  She said that ceremony in her room was trying to attack her.  She describes the man as the devil. Brooks, her nurse, was at the bedside.  She said patient has been yelling or shouting intermittently.  Objective:    Vitals:   01/04/22 2143 01/05/22 0300 01/05/22 0505 01/05/22 1336  BP:  125/70  126/77 132/81  Pulse: 82  70 73  Resp: 16  17 16   Temp: 98.3 F (36.8 C)  97.6 F (36.4 C) 97.9 F (36.6 C)  TempSrc: Oral   Oral  SpO2: 100%  100% 100%  Weight:  59.6 kg    Height:       No data found.   Intake/Output Summary (Last 24 hours) at 01/05/2022 1459 Last data filed at 01/05/2022 1107 Gross per 24 hour  Intake 580 ml  Output 800 ml  Net -220 ml   Filed Weights   01/04/22 0504 01/05/22 0300  Weight: 56.2 kg 59.6 kg    Exam:  GEN: NAD SKIN: No rash EYES: EOMI ENT: MMM CV: RRR PULM: CTA B ABD: soft, ND, NT, +BS CNS: AAO x 1 (person), non focal EXT: No edema or tenderness    Data Reviewed:   I have personally reviewed following labs and imaging studies:  Labs: Labs show the following:   Basic Metabolic Panel: Recent Labs  Lab 01/03/22 2338 01/03/22 2346 01/04/22 0531  NA 142  --  142  K 4.0  --  3.6  CL 107  --  110  CO2 27  --  26  GLUCOSE 113*  --  86  BUN 34*  --  26*  CREATININE 0.97  --  0.78  CALCIUM 8.9  --  9.2  MG  --  2.2  --    GFR Estimated Creatinine Clearance: 55.1 mL/min (by C-G formula based on SCr of 0.78 mg/dL). Liver Function Tests: Recent Labs  Lab 01/03/22 2338 01/04/22 0531  AST 16 17  ALT 15 14  ALKPHOS 75 69  BILITOT 0.4 0.5  PROT 7.0 6.5  ALBUMIN 3.5 3.2*   No results for input(s): "LIPASE", "AMYLASE" in the last 168 hours. Recent Labs  Lab 01/03/22 2338  AMMONIA 32   Coagulation profile No results for input(s): "INR", "PROTIME" in the last 168 hours.  CBC: Recent Labs  Lab 01/03/22 2338 01/04/22 0531  WBC 7.0 6.1  NEUTROABS 5.2  --   HGB 9.7* 9.8*  HCT 29.7* 29.8*  MCV 96.7 97.1  PLT 412* 340   Cardiac Enzymes: No results for input(s): "CKTOTAL", "CKMB", "CKMBINDEX", "TROPONINI" in the last 168 hours. BNP (last 3 results) No results for input(s): "PROBNP" in the last 8760 hours. CBG: Recent Labs  Lab 01/04/22 0837 01/04/22 1247 01/04/22 2142 01/05/22 0730  GLUCAP 87 88  99 82   D-Dimer: No results for input(s): "DDIMER" in the last 72 hours. Hgb A1c: No results for input(s): "HGBA1C" in the last 72 hours. Lipid Profile: No results for input(s): "CHOL", "HDL", "LDLCALC", "TRIG", "CHOLHDL", "LDLDIRECT" in the last 72 hours. Thyroid function studies: No results for input(s): "TSH", "T4TOTAL", "T3FREE", "THYROIDAB" in the last 72  hours.  Invalid input(s): "FREET3" Anemia work up: Recent Labs    01/03/22 2346 01/04/22 0531  VITAMINB12  --  246  FOLATE  --  11.3  FERRITIN  --  59  TIBC  --  259  IRON  --  30  RETICCTPCT 1.0  --    Sepsis Labs: Recent Labs  Lab 01/03/22 2338 01/04/22 0531  WBC 7.0 6.1    Microbiology Recent Results (from the past 240 hour(s))  SARS Coronavirus 2 by RT PCR (hospital order, performed in [facility] hospital lab) *cepheid single result test*     Status: None   Collection Time: 01/04/22 12:51 AM   Specimen: Nasal Swab  Result Value Ref Range Status   SARS Coronavirus 2 by RT PCR NEGATIVE NEGATIVE Final    Comment: (NOTE) SARS-CoV-2 target nucleic acids are NOT DETECTED.  The SARS-CoV-2 RNA is generally detectable in upper and lower respiratory specimens during the acute phase of infection. The lowest concentration of SARS-CoV-2 viral copies this assay can detect is 250 copies / mL. A negative result does not preclude SARS-CoV-2 infection and should not be used as the sole basis for treatment or other patient management decisions.  A negative result may occur with improper specimen collection / handling, submission of specimen other than nasopharyngeal swab, presence of viral mutation(s) within the areas targeted by this assay, and inadequate number of viral copies (<250 copies / mL). A negative result must be combined with clinical observations, patient history, and epidemiological information.  Fact Sheet for Patients:   RoadLapTop.co.za  Fact Sheet for Healthcare  Providers: http://kim-miller.com/  This test is not yet approved or  cleared by the [place] FDA and has been authorized for detection and/or diagnosis of SARS-CoV-2 by FDA under an Emergency Use Authorization (EUA).  This EUA will remain in effect (meaning this test can be used) for the duration of the COVID-19 declaration under Section 564(b)(1) of the Act, 21 U.S.C. section 360bbb-3(b)(1), unless the authorization is terminated or revoked sooner.  Performed at [facility], [address], [place], [place] [address]   Urine Culture     Status: Abnormal (Preliminary result)   Collection Time: 01/04/22 12:53 AM   Specimen: Urine, Clean Catch  Result Value Ref Range Status   Specimen Description   Final    URINE, CLEAN CATCH Performed at [facility], [address], [place], [place] [address]    Special Requests   Final    NONE Performed at [facility], [address], [place], [place] [address]    Culture (A)  Final    >=100,000 COLONIES/mL ESCHERICHIA COLI SUSCEPTIBILITIES TO FOLLOW Performed at [facility], [address], [place], [place] [address]    Report Status PENDING  Incomplete  MRSA Next Gen by PCR, Nasal     Status: None   Collection Time: 01/04/22  5:52 AM   Specimen: Nasal Mucosa; Nasal Swab  Result Value Ref Range Status   MRSA by PCR Next Gen NOT DETECTED NOT DETECTED Final    Comment: (NOTE) The GeneXpert MRSA Assay (FDA approved for NASAL specimens only), is one component of a comprehensive MRSA colonization surveillance program. It is not intended to diagnose MRSA infection nor to guide or monitor treatment for MRSA infections. Test performance is not FDA approved in patients less than 110 years old. Performed at [facility], [address] [place], [place] [address]     Procedures and  diagnostic studies:  DG CHEST PORT 1 VIEW  Result  Date: 01/04/2022 CLINICAL DATA:  UTI, altered mental status. EXAM: PORTABLE CHEST 1 VIEW COMPARISON:  Portable chest 12/08/2021 FINDINGS: The patient is rotated to the right. There is mild cardiomegaly without evidence of CHF. There is mild aortic tortuosity with unremarkable mediastinum. The lungs hypoexpanded but generally clear. No pleural effusion is seen. Multilevel thoracic spine bridging enthesopathy on the right. IMPRESSION: No acute chest findings. Limited view of the bases due to low lung volumes. Mild cardiomegaly. Electronically Signed   By: Telford Nab M.D.   On: 01/04/2022 04:57   CT Head Wo Contrast  Result Date: 01/04/2022 CLINICAL DATA:  Delirium. EXAM: CT HEAD WITHOUT CONTRAST TECHNIQUE: Contiguous axial images were obtained from the base of the skull through the vertex without intravenous contrast. RADIATION DOSE REDUCTION: This exam was performed according to the departmental dose-optimization program which includes automated exposure control, adjustment of the mA and/or kV according to patient size and/or use of iterative reconstruction technique. COMPARISON:  CT 12/08/2021, MRI 06/19/20233 FINDINGS: Brain: No acute intracranial hemorrhage. Expected evolution of lacunar infarct in the left thalamus from prior CT. Stable brain volume. Advanced periventricular and deep white matter hypodensity consistent with chronic small vessel ischemia. No hydrocephalus, midline shift, or mass effect. No subdural or extra-axial collection. Vascular: Atherosclerosis of skullbase vasculature without hyperdense vessel or abnormal calcification. Skull: No fracture or focal lesion. Sinuses/Orbits: Minor mucosal thickening of ethmoid air cells. No sinus fluid levels. No mastoid effusion. Other: None. IMPRESSION: 1. No acute intracranial abnormality. 2. Expected evolution of lacunar infarct in the left thalamus from prior CT. Stable brain volume loss and chronic small vessel ischemia. Electronically Signed    By: Keith Rake M.D.   On: 01/04/2022 00:05               LOS: 1 day   Deborah [name]  [facility]   Pager on [email or web address]. If 7PM-7AM, please contact night-coverage at [email or web address]     01/05/2022, 2:59 PM

## 2022-01-06 DIAGNOSIS — R410 Disorientation, unspecified: Secondary | ICD-10-CM | POA: Diagnosis not present

## 2022-01-06 DIAGNOSIS — N390 Urinary tract infection, site not specified: Secondary | ICD-10-CM | POA: Diagnosis not present

## 2022-01-06 LAB — URINE CULTURE: Culture: >=100000 — AB

## 2022-01-06 LAB — GLUCOSE, CAPILLARY: Glucose-Capillary: 103 mg/dL — ABNORMAL HIGH (ref 70–99)

## 2022-01-06 MED ORDER — HALOPERIDOL LACTATE 5 MG/ML IJ SOLN
5.0000 mg | Freq: Four times a day (QID) | INTRAMUSCULAR | Status: DC | PRN
  Administered 2022-01-06 – 2022-01-08 (×2): 5 mg via INTRAVENOUS
  Filled 2022-01-06 (×2): qty 1

## 2022-01-06 MED ORDER — RISPERIDONE 0.5 MG PO TBDP: 0.5000 mg | ORAL_TABLET | Freq: Two times a day (BID) | ORAL | Status: DC | PRN

## 2022-01-06 MED ORDER — OLANZAPINE 5 MG PO TBDP
5.0000 mg | ORAL_TABLET | Freq: Every day | ORAL | Status: DC
  Administered 2022-01-06: 5 mg via ORAL
  Filled 2022-01-06: qty 1

## 2022-01-06 MED ORDER — HALOPERIDOL LACTATE 5 MG/ML IJ SOLN: 5.0000 mg | Freq: Four times a day (QID) | INTRAMUSCULAR | Status: DC | PRN

## 2022-01-06 MED ORDER — METOPROLOL TARTRATE 5 MG/5ML IV SOLN
5.0000 mg | Freq: Four times a day (QID) | INTRAVENOUS | Status: DC | PRN
  Filled 2022-01-06: qty 5

## 2022-01-06 MED ORDER — LORAZEPAM 1 MG PO TABS
1.0000 mg | ORAL_TABLET | ORAL | Status: DC | PRN
  Administered 2022-01-06 – 2022-01-09 (×4): 1 mg via ORAL
  Filled 2022-01-06 (×5): qty 1

## 2022-01-06 MED ORDER — RISPERIDONE 0.5 MG PO TBDP
0.5000 mg | ORAL_TABLET | Freq: Two times a day (BID) | ORAL | Status: DC
  Administered 2022-01-06 – 2022-01-07 (×4): 0.5 mg via ORAL
  Filled 2022-01-06 (×5): qty 1
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
## 2022-01-06 NOTE — Progress Notes (Signed)
Progress Note    Deborah [name]  IOE:[id number] DOB: 26-Jan-1946  DOA: 01/03/2022 PCP: Pcp, No      Brief Narrative:    Medical records reviewed and are as summarized below:  Deborah [name] is a 76 y.o. female with medical history significant for  anxiety, asthma, cocaine use, history of CVA, HTN, tobacco use, alcohol use who has interim history of admission 12/08/21-7/723 with discharge diagnosis of acute metabolic encephalopathy due to substance abuse in setting of new   CVA of left thalamus. Patient during this hospitalization was also noted to have behavioral disturbances labeled as mood disorder nos vs substance induced psych d/o. Due to patient behavioral disturbances and  mental status, patient was transitioned to supervised setting on discharge.  She was brought back from the nursing facility on the same day of discharge because of aggressive behavior and change in mental status.  Apparently, she was combative with EMS.   She was admitted to the hospital for acute UTI and acute metabolic encephalopathy.  She was treated with empiric IV ceftriaxone.  Even after treatment for UTI she continues to have periods of agitation and continued hallucinations.   Assessment/Plan:   Principal Problem:   UTI (urinary tract infection)   Nutrition Problem: Severe Malnutrition Etiology: social / environmental circumstances (mood disorder, substance abuse)  Signs/Symptoms: severe fat depletion, severe muscle depletion   Body mass index is 19.96 kg/m.   Acute UTI: Urine culture is growing E. coli.  Continue IV ceftriaxone x 3 days  Acute metabolic encephalopathy, delirium/visual hallucination in a patient with recent left thalamic stroke, psychiatric disorder NOS and cocaine use disorder: continue supportive care.  Continue aspirin and rosuvastatin.   -had similar issues last hospitalization -adjust zyprexa to BID and get psych consult as with continued agitation she will not be able to  go back to SNF -currently requiring restraints  Abnormal EKG, LVH with secondary repolarization changes, minimally elevated troponins: No chest pain.  Minimal elevated troponins is likely from demand ischemia.       Consultants: psych      Medications:    amLODipine  5 mg Oral Daily   aspirin  325 mg Oral Daily   cholecalciferol  1,000 Units Oral Daily   feeding supplement  237 mL Oral TID BM   FLUoxetine  20 mg Oral Daily   lisinopril  20 mg Oral Daily   memantine  5 mg Oral BID   nicotine  21 mg Transdermal Daily   OLANZapine  10 mg Oral QHS   OLANZapine zydis  5 mg Oral Daily   rosuvastatin  10 mg Oral Daily   Continuous Infusions:     Anti-infectives (From admission, onward)    Start     Dose/Rate Route Frequency Ordered Stop   01/05/22 0200  cefTRIAXone (ROCEPHIN) 1 g in sodium chloride 0.9 % 100 mL IVPB  Status:  Discontinued        1 g 200 mL/hr over 30 Minutes Intravenous Every 24 hours 01/04/22 0343 01/06/22 0917   01/04/22 0200  cefTRIAXone (ROCEPHIN) 1 g in sodium chloride 0.9 % 100 mL IVPB        1 g 200 mL/hr over 30 Minutes Intravenous  Once 01/04/22 0156 01/04/22 0257              Family Communication/Anticipated D/C date and plan/Code Status   DVT prophylaxis: SCDs Start: 01/04/22 1829     Code Status: Full Code  Family Communication: None Disposition Plan:  needs resolution of her agitation to return to SNF   Status is: Inpatient        Subjective:   continues to have hallucinations- seeing her sister and other people who are not present   Objective:    Vitals:   01/06/22 0027 01/06/22 0222 01/06/22 0338 01/06/22 0350  BP: (!) 175/82 (!) 151/76 (!) 155/78   Pulse: 91 95 88   Resp:   15   Temp:   98.6 F (37 C)   TempSrc:   Oral   SpO2:   100%   Weight:    56.1 kg  Height:       No data found.   Intake/Output Summary (Last 24 hours) at 01/06/2022 1208 Last data filed at 01/06/2022 0900 Gross per 24 hour   Intake 720 ml  Output 1550 ml  Net -830 ml   Filed Weights   01/04/22 0504 01/05/22 0300 01/06/22 0350  Weight: 56.2 kg 59.6 kg 56.1 kg    Exam:   General: Appearance:    Thin female in no acute distress     Lungs:      respirations unlabored  Heart:    Normal heart rate.   MS:   All extremities are intact.   Neurologic:   Asking what her sister is doing-- points at wall       Data Reviewed:   I have personally reviewed following labs and imaging studies:  Labs: Labs show the following:   Basic Metabolic Panel: Recent Labs  Lab 01/03/22 2338 01/03/22 2346 01/04/22 0531  NA 142  --  142  K 4.0  --  3.6  CL 107  --  110  CO2 27  --  26  GLUCOSE 113*  --  86  BUN 34*  --  26*  CREATININE 0.97  --  0.78  CALCIUM 8.9  --  9.2  MG  --  2.2  --    GFR Estimated Creatinine Clearance: 52.2 mL/min (by C-G formula based on SCr of 0.78 mg/dL). Liver Function Tests: Recent Labs  Lab 01/03/22 2338 01/04/22 0531  AST 16 17  ALT 15 14  ALKPHOS 75 69  BILITOT 0.4 0.5  PROT 7.0 6.5  ALBUMIN 3.5 3.2*   No results for input(s): "LIPASE", "AMYLASE" in the last 168 hours. Recent Labs  Lab 01/03/22 2338  AMMONIA 32   Coagulation profile No results for input(s): "INR", "PROTIME" in the last 168 hours.  CBC: Recent Labs  Lab 01/03/22 2338 01/04/22 0531  WBC 7.0 6.1  NEUTROABS 5.2  --   HGB 9.7* 9.8*  HCT 29.7* 29.8*  MCV 96.7 97.1  PLT 412* 340   Cardiac Enzymes: No results for input(s): "CKTOTAL", "CKMB", "CKMBINDEX", "TROPONINI" in the last 168 hours. BNP (last 3 results) No results for input(s): "PROBNP" in the last 8760 hours. CBG: Recent Labs  Lab 01/04/22 0837 01/04/22 1247 01/04/22 2142 01/05/22 0730 01/06/22 0732  GLUCAP 87 88 99 82 103*   D-Dimer: No results for input(s): "DDIMER" in the last 72 hours. Hgb A1c: No results for input(s): "HGBA1C" in the last 72 hours. Lipid Profile: No results for input(s): "CHOL", "HDL", "LDLCALC",  "TRIG", "CHOLHDL", "LDLDIRECT" in the last 72 hours. Thyroid function studies: No results for input(s): "TSH", "T4TOTAL", "T3FREE", "THYROIDAB" in the last 72 hours.  Invalid input(s): "FREET3" Anemia work up: Recent Labs    01/03/22 2346 01/04/22 0531  VITAMINB12  --  246  FOLATE  --  11.3  FERRITIN  --  59  TIBC  --  259  IRON  --  30  RETICCTPCT 1.0  --    Sepsis Labs: Recent Labs  Lab 01/03/22 2338 01/04/22 0531  WBC 7.0 6.1    Microbiology Recent Results (from the past 240 hour(s))  SARS Coronavirus 2 by RT PCR (hospital order, performed in [facility] hospital lab) *cepheid single result test*     Status: None   Collection Time: 01/04/22 12:51 AM   Specimen: Nasal Swab  Result Value Ref Range Status   SARS Coronavirus 2 by RT PCR NEGATIVE NEGATIVE Final    Comment: (NOTE) SARS-CoV-2 target nucleic acids are NOT DETECTED.  The SARS-CoV-2 RNA is generally detectable in upper and lower respiratory specimens during the acute phase of infection. The lowest concentration of SARS-CoV-2 viral copies this assay can detect is 250 copies / mL. A negative result does not preclude SARS-CoV-2 infection and should not be used as the sole basis for treatment or other patient management decisions.  A negative result may occur with improper specimen collection / handling, submission of specimen other than nasopharyngeal swab, presence of viral mutation(s) within the areas targeted by this assay, and inadequate number of viral copies (<250 copies / mL). A negative result must be combined with clinical observations, patient history, and epidemiological information.  Fact Sheet for Patients:   RoadLapTop.co.za  Fact Sheet for Healthcare Providers: http://kim-miller.com/  This test is not yet approved or  cleared by the [place] FDA and has been authorized for detection and/or diagnosis of SARS-CoV-2 by FDA under an Emergency Use  Authorization (EUA).  This EUA will remain in effect (meaning this test can be used) for the duration of the COVID-19 declaration under Section 564(b)(1) of the Act, 21 U.S.C. section 360bbb-3(b)(1), unless the authorization is terminated or revoked sooner.  Performed at [facility], [address], [place], [place] [address]   Urine Culture     Status: Abnormal   Collection Time: 01/04/22 12:53 AM   Specimen: Urine, Clean Catch  Result Value Ref Range Status   Specimen Description   Final    URINE, CLEAN CATCH Performed at [facility], [address] [place], [place] [address]    Special Requests   Final    NONE Performed at [facility], [address] [place], [place] [address]    Culture >=100,000 COLONIES/mL ESCHERICHIA COLI (A)  Final   Report Status 01/06/2022 FINAL  Final   Organism ID, Bacteria ESCHERICHIA COLI (A)  Final      Susceptibility   Escherichia coli - MIC*    AMPICILLIN <=2 SENSITIVE Sensitive     CEFAZOLIN <=4 SENSITIVE Sensitive     CEFEPIME <=0.12 SENSITIVE Sensitive     CEFTRIAXONE <=0.25 SENSITIVE Sensitive     CIPROFLOXACIN >=4 RESISTANT Resistant     GENTAMICIN <=1 SENSITIVE Sensitive     IMIPENEM <=0.25 SENSITIVE Sensitive     NITROFURANTOIN <=16 SENSITIVE Sensitive     TRIMETH/SULFA <=20 SENSITIVE Sensitive     AMPICILLIN/SULBACTAM <=2 SENSITIVE Sensitive     PIP/TAZO <=4 SENSITIVE Sensitive     * >=100,000 COLONIES/mL ESCHERICHIA COLI  MRSA Next Gen by PCR, Nasal     Status: None   Collection Time: 01/04/22  5:52 AM   Specimen: Nasal Mucosa; Nasal Swab  Result Value Ref Range Status   MRSA by PCR Next Gen NOT DETECTED NOT DETECTED Final    Comment: (NOTE) The GeneXpert MRSA Assay (FDA approved  for NASAL specimens only), is one component of a comprehensive MRSA colonization surveillance program. It is not intended to diagnose MRSA infection nor to guide or monitor treatment for  MRSA infections. Test performance is not FDA approved in patients less than 65 years old. Performed at [facility], [address] [place], [place] [address]     Procedures and diagnostic studies:  No results found.     LOS: 2 days   Joseph Art  [facility]   Pager on [email or web address]. If 7PM-7AM, please contact night-coverage at [email or web address]     01/06/2022, 12:08 PM

## 2022-01-07 DIAGNOSIS — E162 Hypoglycemia, unspecified: Secondary | ICD-10-CM

## 2022-01-07 DIAGNOSIS — R410 Disorientation, unspecified: Secondary | ICD-10-CM | POA: Diagnosis not present

## 2022-01-07 DIAGNOSIS — N390 Urinary tract infection, site not specified: Secondary | ICD-10-CM | POA: Diagnosis not present

## 2022-01-07 LAB — GLUCOSE, CAPILLARY
Glucose-Capillary: 57 mg/dL — ABNORMAL LOW (ref 70–99)
Glucose-Capillary: 140 mg/dL — ABNORMAL HIGH (ref 70–99)

## 2022-01-07 MED ORDER — DEXTROSE 50 % IV SOLN
25.0000 g | INTRAVENOUS | Status: AC
Start: 2022-01-07 — End: 2022-01-07
  Administered 2022-01-07: 25 g via INTRAVENOUS
  Filled 2022-01-07: qty 50

## 2022-01-07 MED ORDER — DEXTROSE-NACL 5-0.45 % IV SOLN: INTRAVENOUS | Status: DC

## 2022-01-07 NOTE — Progress Notes (Signed)
Progress Note    Deborah [name]  JKK:[id number] DOB: 1946/04/17  DOA: 01/03/2022 PCP: Pcp, No      Brief Narrative:    Medical records reviewed and are as summarized below:  Deborah [name] is a 76 y.o. female with medical history significant for  anxiety, asthma, cocaine use, history of CVA, HTN, tobacco use, alcohol use who has interim history of admission 12/08/21-7/723 with discharge diagnosis of acute metabolic encephalopathy due to substance abuse in setting of new   CVA of left thalamus. Patient during this hospitalization was also noted to have behavioral disturbances labeled as mood disorder nos vs substance induced psych d/o. Due to patient behavioral disturbances and  mental status, patient was transitioned to supervised setting on discharge.  She was brought back from the nursing facility on the same day of discharge because of aggressive behavior and change in mental status.  Apparently, she was combative with EMS.  She was admitted to the hospital for acute UTI and acute metabolic encephalopathy.  She was treated with empiric IV ceftriaxone.  Even after treatment for UTI she continues to have periods of agitation and continued hallucinations.  Now with some hypoglycemia.   Assessment/Plan:   Principal Problem:   UTI (urinary tract infection) Active Problems:   Delirium   Hypoglycemia   Nutrition Problem: Severe Malnutrition Etiology: social / environmental circumstances (mood disorder, substance abuse)  Signs/Symptoms: severe fat depletion, severe muscle depletion   Acute UTI: Urine culture is growing E. coli.  Treated with IV ceftriaxone x 3 days  Acute metabolic encephalopathy, delirium/visual hallucination in a patient with recent left thalamic stroke, psychiatric disorder NOS and cocaine use disorder: continue supportive care.  Continue aspirin and rosuvastatin.   -had similar issues last hospitalization -adjust zyprexa to BID and get psych consult as with  continued agitation she will not be able to go back to SNF -currently requiring restraints and a sitter  Hypoglycemia -due to poor PO intake -IVF until eating better  Abnormal EKG, LVH with secondary repolarization changes, minimally elevated troponins: No chest pain.  Minimal elevated troponins is likely from demand ischemia.       Consultants: psych      Medications:    amLODipine  5 mg Oral Daily   aspirin  325 mg Oral Daily   cholecalciferol  1,000 Units Oral Daily   feeding supplement  237 mL Oral TID BM   FLUoxetine  20 mg Oral Daily   lisinopril  20 mg Oral Daily   memantine  5 mg Oral BID   nicotine  21 mg Transdermal Daily   risperiDONE  0.5 mg Oral Q12H   rosuvastatin  10 mg Oral Daily   Continuous Infusions:  dextrose 5 % and 0.45% NaCl 50 mL/hr at 01/07/22 0847      Anti-infectives (From admission, onward)    Start     Dose/Rate Route Frequency Ordered Stop   01/05/22 0200  cefTRIAXone (ROCEPHIN) 1 g in sodium chloride 0.9 % 100 mL IVPB  Status:  Discontinued        1 g 200 mL/hr over 30 Minutes Intravenous Every 24 hours 01/04/22 0343 01/06/22 0917   01/04/22 0200  cefTRIAXone (ROCEPHIN) 1 g in sodium chloride 0.9 % 100 mL IVPB        1 g 200 mL/hr over 30 Minutes Intravenous  Once 01/04/22 0156 01/04/22 0257              Family Communication/Anticipated D/C date and plan/Code  Status   DVT prophylaxis: SCDs Start: 01/04/22 6734     Code Status: Full Code  Family Communication: None Disposition Plan: needs resolution of her agitation to return to SNF   Status is: Inpatient        Subjective:   Sleeping this AM Blood sugar was <60   Objective:    Vitals:   01/06/22 1331 01/06/22 2010 01/07/22 0324 01/07/22 0445  BP: (!) 156/86 140/77 125/70   Pulse: (!) 106 80 73   Resp: 19 19    Temp: 97.7 F (36.5 C) 98.8 F (37.1 C) (!) 97.5 F (36.4 C)   TempSrc: Oral Oral Axillary   SpO2: 99% 100% 100%   Weight:    59.6  kg  Height:       No data found.   Intake/Output Summary (Last 24 hours) at 01/07/2022 1101 Last data filed at 01/07/2022 0904 Gross per 24 hour  Intake 600 ml  Output --  Net 600 ml   Filed Weights   01/05/22 0300 01/06/22 0350 01/07/22 0445  Weight: 59.6 kg 56.1 kg 59.6 kg    Exam:    General: Appearance:    sleeping female in no acute distress     Lungs:     respirations unlabored  Heart:    Normal heart rate.   MS:   All extremities are intact.   Neurologic:   Awake, alert       Data Reviewed:   I have personally reviewed following labs and imaging studies:  Labs: Labs show the following:   Basic Metabolic Panel: Recent Labs  Lab 01/03/22 2338 01/03/22 2346 01/04/22 0531  NA 142  --  142  K 4.0  --  3.6  CL 107  --  110  CO2 27  --  26  GLUCOSE 113*  --  86  BUN 34*  --  26*  CREATININE 0.97  --  0.78  CALCIUM 8.9  --  9.2  MG  --  2.2  --    GFR Estimated Creatinine Clearance: 55.1 mL/min (by C-G formula based on SCr of 0.78 mg/dL). Liver Function Tests: Recent Labs  Lab 01/03/22 2338 01/04/22 0531  AST 16 17  ALT 15 14  ALKPHOS 75 69  BILITOT 0.4 0.5  PROT 7.0 6.5  ALBUMIN 3.5 3.2*   No results for input(s): "LIPASE", "AMYLASE" in the last 168 hours. Recent Labs  Lab 01/03/22 2338  AMMONIA 32   Coagulation profile No results for input(s): "INR", "PROTIME" in the last 168 hours.  CBC: Recent Labs  Lab 01/03/22 2338 01/04/22 0531  WBC 7.0 6.1  NEUTROABS 5.2  --   HGB 9.7* 9.8*  HCT 29.7* 29.8*  MCV 96.7 97.1  PLT 412* 340   Cardiac Enzymes: No results for input(s): "CKTOTAL", "CKMB", "CKMBINDEX", "TROPONINI" in the last 168 hours. BNP (last 3 results) No results for input(s): "PROBNP" in the last 8760 hours. CBG: Recent Labs  Lab 01/04/22 2142 01/05/22 0730 01/06/22 0732 01/07/22 0814 01/07/22 0902  GLUCAP 99 82 103* 57* 140*   D-Dimer: No results for input(s): "DDIMER" in the last 72 hours. Hgb A1c: No  results for input(s): "HGBA1C" in the last 72 hours. Lipid Profile: No results for input(s): "CHOL", "HDL", "LDLCALC", "TRIG", "CHOLHDL", "LDLDIRECT" in the last 72 hours. Thyroid function studies: No results for input(s): "TSH", "T4TOTAL", "T3FREE", "THYROIDAB" in the last 72 hours.  Invalid input(s): "FREET3" Anemia work up: No results for input(s): "VITAMINB12", "FOLATE", "FERRITIN", "TIBC", "IRON", "  RETICCTPCT" in the last 72 hours.  Sepsis Labs: Recent Labs  Lab 01/03/22 2338 01/04/22 0531  WBC 7.0 6.1    Microbiology Recent Results (from the past 240 hour(s))  SARS Coronavirus 2 by RT PCR (hospital order, performed in [facility] hospital lab) *cepheid single result test*     Status: None   Collection Time: 01/04/22 12:51 AM   Specimen: Nasal Swab  Result Value Ref Range Status   SARS Coronavirus 2 by RT PCR NEGATIVE NEGATIVE Final    Comment: (NOTE) SARS-CoV-2 target nucleic acids are NOT DETECTED.  The SARS-CoV-2 RNA is generally detectable in upper and lower respiratory specimens during the acute phase of infection. The lowest concentration of SARS-CoV-2 viral copies this assay can detect is 250 copies / mL. A negative result does not preclude SARS-CoV-2 infection and should not be used as the sole basis for treatment or other patient management decisions.  A negative result may occur with improper specimen collection / handling, submission of specimen other than nasopharyngeal swab, presence of viral mutation(s) within the areas targeted by this assay, and inadequate number of viral copies (<250 copies / mL). A negative result must be combined with clinical observations, patient history, and epidemiological information.  Fact Sheet for Patients:   RoadLapTop.co.za  Fact Sheet for Healthcare Providers: http://kim-miller.com/  This test is not yet approved or  cleared by the [place] FDA and has been authorized  for detection and/or diagnosis of SARS-CoV-2 by FDA under an Emergency Use Authorization (EUA).  This EUA will remain in effect (meaning this test can be used) for the duration of the COVID-19 declaration under Section 564(b)(1) of the Act, 21 U.S.C. section 360bbb-3(b)(1), unless the authorization is terminated or revoked sooner.  Performed at [facility], [address], [place], [place] [address]   Urine Culture     Status: Abnormal   Collection Time: 01/04/22 12:53 AM   Specimen: Urine, Clean Catch  Result Value Ref Range Status   Specimen Description   Final    URINE, CLEAN CATCH Performed at [facility], [address], [place], [place] [address]    Special Requests   Final    NONE Performed at [facility], [address], [place], [place] [address]    Culture >=100,000 COLONIES/mL ESCHERICHIA COLI (A)  Final   Report Status 01/06/2022 FINAL  Final   Organism ID, Bacteria ESCHERICHIA COLI (A)  Final      Susceptibility   Escherichia coli - MIC*    AMPICILLIN <=2 SENSITIVE Sensitive     CEFAZOLIN <=4 SENSITIVE Sensitive     CEFEPIME <=0.12 SENSITIVE Sensitive     CEFTRIAXONE <=0.25 SENSITIVE Sensitive     CIPROFLOXACIN >=4 RESISTANT Resistant     GENTAMICIN <=1 SENSITIVE Sensitive     IMIPENEM <=0.25 SENSITIVE Sensitive     NITROFURANTOIN <=16 SENSITIVE Sensitive     TRIMETH/SULFA <=20 SENSITIVE Sensitive     AMPICILLIN/SULBACTAM <=2 SENSITIVE Sensitive     PIP/TAZO <=4 SENSITIVE Sensitive     * >=100,000 COLONIES/mL ESCHERICHIA COLI  MRSA Next Gen by PCR, Nasal     Status: None   Collection Time: 01/04/22  5:52 AM   Specimen: Nasal Mucosa; Nasal Swab  Result Value Ref Range Status   MRSA by PCR Next Gen NOT DETECTED NOT DETECTED Final    Comment: (NOTE) The GeneXpert MRSA Assay (FDA approved for NASAL specimens only), is one component of a comprehensive MRSA colonization surveillance program.  It is not  intended to diagnose MRSA infection nor to guide or monitor treatment for MRSA infections. Test performance is not FDA approved in patients less than 51 years old. Performed at [facility], [address] [place], [place] [address]     Procedures and diagnostic studies:  No results found.     LOS: 3 days   Joseph Art  [facility]   Pager on [email or web address]. If 7PM-7AM, please contact night-coverage at [email or web address]     01/07/2022, 11:01 AM

## 2022-01-08 DIAGNOSIS — N390 Urinary tract infection, site not specified: Secondary | ICD-10-CM | POA: Diagnosis not present

## 2022-01-08 LAB — CBC
HCT: 36.2 % (ref 36.0–46.0)
Hemoglobin: 11.3 g/dL — ABNORMAL LOW (ref 12.0–15.0)
MCH: 31.3 pg (ref 26.0–34.0)
MCHC: 31.2 g/dL (ref 30.0–36.0)
MCV: 100.3 fL — ABNORMAL HIGH (ref 80.0–100.0)
Platelets: 409 10*3/uL — ABNORMAL HIGH (ref 150–400)
RBC: 3.61 MIL/uL — ABNORMAL LOW (ref 3.87–5.11)
RDW: 13.7 % (ref 11.5–15.5)
WBC: 6.8 10*3/uL (ref 4.0–10.5)
nRBC: 0 % (ref 0.0–0.2)

## 2022-01-08 LAB — GLUCOSE, CAPILLARY: Glucose-Capillary: 128 mg/dL — ABNORMAL HIGH (ref 70–99)

## 2022-01-08 LAB — BASIC METABOLIC PANEL
Anion gap: 7 (ref 5–15)
BUN: 35 mg/dL — ABNORMAL HIGH (ref 8–23)
CO2: 26 mmol/L (ref 22–32)
Calcium: 9.1 mg/dL (ref 8.9–10.3)
Chloride: 101 mmol/L (ref 98–111)
Creatinine, Ser: 0.87 mg/dL (ref 0.44–1.00)
GFR, Estimated: >60 mL/min (ref >60)
Glucose, Bld: 103 mg/dL — ABNORMAL HIGH (ref 70–99)
Potassium: 4.4 mmol/L (ref 3.5–5.1)
Sodium: 134 mmol/L — ABNORMAL LOW (ref 135–145)

## 2022-01-08 MED ORDER — RISPERIDONE 1 MG PO TBDP
1.0000 mg | ORAL_TABLET | Freq: Two times a day (BID) | ORAL | Status: DC
  Administered 2022-01-08 – 2022-01-10 (×5): 1 mg via ORAL
  Filled 2022-01-08 (×5): qty 1

## 2022-01-08 MED ORDER — ENOXAPARIN SODIUM 40 MG/0.4ML IJ SOSY
40.0000 mg | PREFILLED_SYRINGE | INTRAMUSCULAR | Status: DC
  Administered 2022-01-08 – 2022-01-09 (×2): 40 mg via SUBCUTANEOUS
  Filled 2022-01-08 (×2): qty 0.4

## 2022-01-08 NOTE — Progress Notes (Signed)
Pt given haldol prn due to not listening to sitter that she needed to stay in bed. Pt was calling for "Darryl".

## 2022-01-08 NOTE — Plan of Care (Signed)
No acute events after administering haldol this shift.  Problem: Education: Goal: Knowledge of General Education information will improve Description: Including pain rating scale, medication(s)/side effects and non-pharmacologic comfort measures Outcome: Progressing   Problem: Health Behavior/Discharge Planning: Goal: Ability to manage health-related needs will improve Outcome: Progressing   Problem: Clinical Measurements: Goal: Ability to maintain clinical measurements within normal limits will improve Outcome: Progressing Goal: Will remain free from infection Outcome: Progressing Goal: Diagnostic test results will improve Outcome: Progressing Goal: Respiratory complications will improve Outcome: Progressing Goal: Cardiovascular complication will be avoided Outcome: Progressing   Problem: Activity: Goal: Risk for activity intolerance will decrease Outcome: Progressing   Problem: Nutrition: Goal: Adequate nutrition will be maintained Outcome: Progressing   Problem: Coping: Goal: Level of anxiety will decrease Outcome: Progressing   Problem: Elimination: Goal: Will not experience complications related to bowel motility Outcome: Progressing Goal: Will not experience complications related to urinary retention Outcome: Progressing   Problem: Pain Managment: Goal: General experience of comfort will improve Outcome: Progressing   Problem: Safety: Goal: Ability to remain free from injury will improve Outcome: Progressing   Problem: Skin Integrity: Goal: Risk for impaired skin integrity will decrease Outcome: Progressing   Problem: Urinary Elimination: Goal: Signs and symptoms of infection will decrease Outcome: Progressing   Problem: Safety: Goal: Non-violent Restraint(s) Outcome: Progressing

## 2022-01-08 NOTE — Progress Notes (Signed)
PROGRESS NOTE  Deborah [name] [id number] DOB: 02/21/1946 DOA: 01/03/2022 PCP: Pcp, No   LOS: 4 days   Brief Narrative / Interim history: 76 y.o. female with medical history significant for anxiety, asthma, cocaine use, history of CVA, HTN, tobacco use, alcohol use who has interim history of admission 12/08/21-7/723 with discharge diagnosis of acute metabolic encephalopathy due to substance abuse in setting of new   CVA of left thalamus. Patient during this hospitalization was also noted to have behavioral disturbances labeled as mood disorder nos vs substance induced psych d/o. Due to patient behavioral disturbances and  mental status, patient was transitioned to supervised setting on discharge.  She was brought back from the nursing facility on the same day of discharge because of aggressive behavior and change in mental status.  Apparently, she was combative with EMS.  Subjective / 24h Interval events: States that she is feeling well.  Denies any pain.  Knows that she is in the hospital but does not know why.  Disoriented to time and situation  Assesement and Plan: Principal Problem:   UTI (urinary tract infection) Active Problems:   Delirium   Hypoglycemia   Principal problem Acute UTI- Urine culture is growing E. coli.  Treated with IV ceftriaxone x 3 days  Active problems Acute metabolic encephalopathy, delirium/visual hallucination in a patient with recent left thalamic stroke, psychiatric disorder NOS and cocaine use disorder- continue supportive care.  Continue aspirin and rosuvastatin. Had similar issues last hospitalization.  Cardiac consulted, discontinued Zyprexa and started patient on risperidone.  Continue Haldol as needed.  Seems calmer today, if this persists attempt to discontinue sitter   Hypoglycemia -due to poor PO intake   Abnormal EKG, LVH with secondary repolarization changes, minimally elevated troponins-No chest pain.  Minimal elevated troponins is likely from  demand ischemia.  Hypertension-continue amlodipine, lisinopril  Substance abuse-prior to her June hospitalization.  UDS was positive for cocaine  History of pericardial effusion-seen during prior hospital stay.  Severe protein calorie malnutrition-as evidenced by severe fat depletion, severe muscle depletion.   Scheduled Meds:  amLODipine  5 mg Oral Daily   aspirin  325 mg Oral Daily   cholecalciferol  1,000 Units Oral Daily   feeding supplement  237 mL Oral TID BM   FLUoxetine  20 mg Oral Daily   lisinopril  20 mg Oral Daily   memantine  5 mg Oral BID   nicotine  21 mg Transdermal Daily   risperiDONE  1 mg Oral Q12H   rosuvastatin  10 mg Oral Daily   Continuous Infusions:  dextrose 5 % and 0.45% NaCl 50 mL/hr at 01/08/22 0509   PRN Meds:.acetaminophen **OR** acetaminophen, albuterol, haloperidol lactate, hydrALAZINE, LORazepam, metoprolol tartrate, ondansetron **OR** ondansetron (ZOFRAN) IV, mouth rinse, risperiDONE, senna-docusate  Diet Orders (From admission, onward)     Start     Ordered   01/05/22 1242  Diet regular Room service appropriate? Yes; Fluid consistency: Thin  Diet effective now       Question Answer Comment  Room service appropriate? Yes   Fluid consistency: Thin      01/05/22 1241            DVT prophylaxis: SCDs Start: 01/04/22 [id number]   Lab Results  Component Value Date   PLT 409 (H) 01/08/2022      Code Status: Full Code  Family Communication: no family at bedside   Status is: Inpatient  Remains inpatient appropriate because: confused, sitter present   Level of care: Telemetry  Consultants:  Psychiatry    Objective: Vitals:   01/07/22 1338 01/07/22 1910 01/07/22 1949 01/08/22 0332  BP: 108/63 104/64 111/68 105/64  Pulse: 81 80 80 67  Resp: 16  18 20   Temp: 97.6 F (36.4 C) 98.2 F (36.8 C) 98.4 F (36.9 C)   TempSrc:  Oral Oral   SpO2: 100% 99% 100% 100%  Weight:      Height:        Intake/Output Summary (Last 24  hours) at 01/08/2022 1227 Last data filed at 01/08/2022 1141 Gross per 24 hour  Intake 1773.64 ml  Output 1300 ml  Net 473.64 ml   Wt Readings from Last 3 Encounters:  01/07/22 59.6 kg  12/26/21 54.4 kg  08/04/21 52.2 kg    Examination:  Constitutional: NAD Eyes: no scleral icterus ENMT: Mucous membranes are moist.  Neck: normal, supple Respiratory: clear to auscultation bilaterally, no wheezing, no crackles. Normal respiratory effort.  Cardiovascular: Regular rate and rhythm, no murmurs / rubs / gallops. No LE edema.  Abdomen: non distended, no tenderness. Bowel sounds positive.  Musculoskeletal: no clubbing / cyanosis.  Skin: no rashes Neurologic: Does not follow commands consistently, grossly nonfocal   Data Reviewed: I have independently reviewed following labs and imaging studies   CBC Recent Labs  Lab 01/03/22 2338 01/04/22 0531 01/08/22 0545  WBC 7.0 6.1 6.8  HGB 9.7* 9.8* 11.3*  HCT 29.7* 29.8* 36.2  PLT 412* 340 409*  MCV 96.7 97.1 100.3*  MCH 31.6 31.9 31.3  MCHC 32.7 32.9 31.2  RDW 13.5 13.5 13.7  LYMPHSABS 1.1  --   --   MONOABS 0.5  --   --   EOSABS 0.1  --   --   BASOSABS 0.0  --   --     Recent Labs  Lab 01/03/22 2338 01/03/22 2346 01/04/22 0531 01/08/22 0545  NA 142  --  142 134*  K 4.0  --  3.6 4.4  CL 107  --  110 101  CO2 27  --  26 26  GLUCOSE 113*  --  86 103*  BUN 34*  --  26* 35*  CREATININE 0.97  --  0.78 0.87  CALCIUM 8.9  --  9.2 9.1  AST 16  --  17  --   ALT 15  --  14  --   ALKPHOS 75  --  69  --   BILITOT 0.4  --  0.5  --   ALBUMIN 3.5  --  3.2*  --   MG  --  2.2  --   --   AMMONIA 32  --   --   --     ------------------------------------------------------------------------------------------------------------------ No results for input(s): "CHOL", "HDL", "LDLCALC", "TRIG", "CHOLHDL", "LDLDIRECT" in the last 72 hours.  Lab Results  Component Value Date   HGBA1C 5.5 12/09/2021    ------------------------------------------------------------------------------------------------------------------ No results for input(s): "TSH", "T4TOTAL", "T3FREE", "THYROIDAB" in the last 72 hours.  Invalid input(s): "FREET3"  Cardiac Enzymes No results for input(s): "CKMB", "TROPONINI", "MYOGLOBIN" in the last 168 hours.  Invalid input(s): "CK" ------------------------------------------------------------------------------------------------------------------ No results found for: "BNP"  CBG: Recent Labs  Lab 01/05/22 0730 01/06/22 0732 01/07/22 0814 01/07/22 0902 01/08/22 1038  GLUCAP 82 103* 57* 140* 128*    Recent Results (from the past 240 hour(s))  SARS Coronavirus 2 by RT PCR (hospital order, performed in [facility] hospital lab) *cepheid single result test*     Status: None   Collection Time: 01/04/22 12:51 AM  Specimen: Nasal Swab  Result Value Ref Range Status   SARS Coronavirus 2 by RT PCR NEGATIVE NEGATIVE Final    Comment: (NOTE) SARS-CoV-2 target nucleic acids are NOT DETECTED.  The SARS-CoV-2 RNA is generally detectable in upper and lower respiratory specimens during the acute phase of infection. The lowest concentration of SARS-CoV-2 viral copies this assay can detect is 250 copies / mL. A negative result does not preclude SARS-CoV-2 infection and should not be used as the sole basis for treatment or other patient management decisions.  A negative result may occur with improper specimen collection / handling, submission of specimen other than nasopharyngeal swab, presence of viral mutation(s) within the areas targeted by this assay, and inadequate number of viral copies (<250 copies / mL). A negative result must be combined with clinical observations, patient history, and epidemiological information.  Fact Sheet for Patients:   RoadLapTop.co.za  Fact Sheet for Healthcare  Providers: http://kim-miller.com/  This test is not yet approved or  cleared by the [place] FDA and has been authorized for detection and/or diagnosis of SARS-CoV-2 by FDA under an Emergency Use Authorization (EUA).  This EUA will remain in effect (meaning this test can be used) for the duration of the COVID-19 declaration under Section 564(b)(1) of the Act, 21 U.S.C. section 360bbb-3(b)(1), unless the authorization is terminated or revoked sooner.  Performed at [facility], [address], [place], [place] [address]   Urine Culture     Status: Abnormal   Collection Time: 01/04/22 12:53 AM   Specimen: Urine, Clean Catch  Result Value Ref Range Status   Specimen Description   Final    URINE, CLEAN CATCH Performed at [facility], [address], [place], [place] [address]    Special Requests   Final    NONE Performed at [facility], [address], [place], [place] [address]    Culture >=100,000 COLONIES/mL ESCHERICHIA COLI (A)  Final   Report Status 01/06/2022 FINAL  Final   Organism ID, Bacteria ESCHERICHIA COLI (A)  Final      Susceptibility   Escherichia coli - MIC*    AMPICILLIN <=2 SENSITIVE Sensitive     CEFAZOLIN <=4 SENSITIVE Sensitive     CEFEPIME <=0.12 SENSITIVE Sensitive     CEFTRIAXONE <=0.25 SENSITIVE Sensitive     CIPROFLOXACIN >=4 RESISTANT Resistant     GENTAMICIN <=1 SENSITIVE Sensitive     IMIPENEM <=0.25 SENSITIVE Sensitive     NITROFURANTOIN <=16 SENSITIVE Sensitive     TRIMETH/SULFA <=20 SENSITIVE Sensitive     AMPICILLIN/SULBACTAM <=2 SENSITIVE Sensitive     PIP/TAZO <=4 SENSITIVE Sensitive     * >=100,000 COLONIES/mL ESCHERICHIA COLI  MRSA Next Gen by PCR, Nasal     Status: None   Collection Time: 01/04/22  5:52 AM   Specimen: Nasal Mucosa; Nasal Swab  Result Value Ref Range Status   MRSA by PCR Next Gen NOT DETECTED NOT DETECTED Final    Comment: (NOTE) The  GeneXpert MRSA Assay (FDA approved for NASAL specimens only), is one component of a comprehensive MRSA colonization surveillance program. It is not intended to diagnose MRSA infection nor to guide or monitor treatment for MRSA infections. Test performance is not FDA approved in patients less than 62 years old. Performed at [facility], [address] [place], [place] [address]      Radiology Studies: No results found.   Pamella Pert, MD, PhD [facility]  Between 7 am -  7 pm I am available, please contact me via Amion (for emergencies) or Securechat (non urgent messages)  Between 7 pm - 7 am I am not available, please contact night coverage MD/APP via Amion

## 2022-01-09 ENCOUNTER — Inpatient Hospital Stay (HOSPITAL_COMMUNITY): Payer: 59

## 2022-01-09 DIAGNOSIS — N390 Urinary tract infection, site not specified: Secondary | ICD-10-CM | POA: Diagnosis not present

## 2022-01-09 LAB — GLUCOSE, CAPILLARY: Glucose-Capillary: 96 mg/dL (ref 70–99)

## 2022-01-09 NOTE — Progress Notes (Signed)
PROGRESS NOTE  Deborah [name] YQI:[id number] DOB: 08-25-1945 DOA: 01/03/2022 PCP: Pcp, No   LOS: 5 days   Brief Narrative / Interim history: 76 y.o. female with medical history significant for anxiety, asthma, cocaine use, history of CVA, HTN, tobacco use, alcohol use who has interim history of admission 12/08/21-7/723 with discharge diagnosis of acute metabolic encephalopathy due to substance abuse in setting of new   CVA of left thalamus. Patient during this hospitalization was also noted to have behavioral disturbances labeled as mood disorder nos vs substance induced psych d/o. Due to patient behavioral disturbances and  mental status, patient was transitioned to supervised setting on discharge.  She was brought back from the nursing facility on the same day of discharge because of aggressive behavior and change in mental status.  Apparently, she was combative with EMS.  Subjective / 24h Interval events: No longer requires a sitter.  She is at the edge of the bed about to eat breakfast.  Remains confused, but she is alert and has no complaints  Assesement and Plan: Principal Problem:   UTI (urinary tract infection) Active Problems:   Delirium   Hypoglycemia   Principal problem Acute UTI- Urine culture is growing E. coli.  Treated with IV ceftriaxone x 3 days  Active problems Acute metabolic encephalopathy, delirium/visual hallucination in a patient with recent left thalamic stroke, psychiatric disorder NOS and cocaine use disorder- continue supportive care.  Continue aspirin and rosuvastatin. Had similar issues last hospitalization.  Cardiac consulted, discontinued Zyprexa and started patient on risperidone.  Continue Haldol as needed.  Overall encephalopathy and agitation seems to be better.  Does not require a sitter anymore.  Discussed with social worker about SNF placement   Hypoglycemia -due to poor PO intake   Abnormal EKG, LVH with secondary repolarization changes, minimally  elevated troponins-No chest pain.  Minimal elevated troponins is likely from demand ischemia.  Hypertension-continue amlodipine, lisinopril  Substance abuse-prior to her June hospitalization.  UDS was positive for cocaine  History of pericardial effusion-seen during prior hospital stay.  Severe protein calorie malnutrition-as evidenced by severe fat depletion, severe muscle depletion.   Scheduled Meds:  amLODipine  5 mg Oral Daily   aspirin  325 mg Oral Daily   cholecalciferol  1,000 Units Oral Daily   enoxaparin (LOVENOX) injection  40 mg Subcutaneous Q24H   feeding supplement  237 mL Oral TID BM   FLUoxetine  20 mg Oral Daily   lisinopril  20 mg Oral Daily   memantine  5 mg Oral BID   nicotine  21 mg Transdermal Daily   risperiDONE  1 mg Oral Q12H   rosuvastatin  10 mg Oral Daily   Continuous Infusions:   PRN Meds:.acetaminophen **OR** acetaminophen, albuterol, haloperidol lactate, hydrALAZINE, LORazepam, metoprolol tartrate, ondansetron **OR** ondansetron (ZOFRAN) IV, mouth rinse, risperiDONE, senna-docusate  Diet Orders (From admission, onward)     Start     Ordered   01/05/22 1242  Diet regular Room service appropriate? Yes; Fluid consistency: Thin  Diet effective now       Question Answer Comment  Room service appropriate? Yes   Fluid consistency: Thin      01/05/22 1241            DVT prophylaxis: enoxaparin (LOVENOX) injection 40 mg Start: 01/08/22 1600 SCDs Start: 01/04/22 3875   Lab Results  Component Value Date   PLT 409 (H) 01/08/2022      Code Status: Full Code  Family Communication: no family at bedside  Status is: Inpatient  Remains inpatient appropriate because: Placement   Level of care: Telemetry  Consultants:  Psychiatry    Objective: Vitals:   01/08/22 1416 01/08/22 2025 01/09/22 0500 01/09/22 0528  BP: 118/61 134/83  (!) 143/85  Pulse: 80 92  82  Resp: 18 16  16   Temp: 98 F (36.7 C) 98.3 F (36.8 C)  (!) 97.4 F (36.3  C)  TempSrc: Oral   Oral  SpO2: 100% 100%  100%  Weight:   56.5 kg   Height:        Intake/Output Summary (Last 24 hours) at 01/09/2022 1057 Last data filed at 01/08/2022 1700 Gross per 24 hour  Intake 1642.8 ml  Output --  Net 1642.8 ml    Wt Readings from Last 3 Encounters:  01/09/22 56.5 kg  12/26/21 54.4 kg  08/04/21 52.2 kg    Examination: Constitutional: NAD Eyes: lids and conjunctivae normal, no scleral icterus ENMT: mmm Neck: normal, supple Respiratory: clear to auscultation bilaterally, no wheezing, no crackles. Normal respiratory effort.  Cardiovascular: Regular rate and rhythm, no murmurs / rubs / gallops. No LE edema. Abdomen: soft, no distention, no tenderness. Bowel sounds positive.  Skin: no rashes Neurologic: no focal deficits, equal strength   Data Reviewed: I have independently reviewed following labs and imaging studies   CBC Recent Labs  Lab 01/03/22 2338 01/04/22 0531 01/08/22 0545  WBC 7.0 6.1 6.8  HGB 9.7* 9.8* 11.3*  HCT 29.7* 29.8* 36.2  PLT 412* 340 409*  MCV 96.7 97.1 100.3*  MCH 31.6 31.9 31.3  MCHC 32.7 32.9 31.2  RDW 13.5 13.5 13.7  LYMPHSABS 1.1  --   --   MONOABS 0.5  --   --   EOSABS 0.1  --   --   BASOSABS 0.0  --   --      Recent Labs  Lab 01/03/22 2338 01/03/22 2346 01/04/22 0531 01/08/22 0545  NA 142  --  142 134*  K 4.0  --  3.6 4.4  CL 107  --  110 101  CO2 27  --  26 26  GLUCOSE 113*  --  86 103*  BUN 34*  --  26* 35*  CREATININE 0.97  --  0.78 0.87  CALCIUM 8.9  --  9.2 9.1  AST 16  --  17  --   ALT 15  --  14  --   ALKPHOS 75  --  69  --   BILITOT 0.4  --  0.5  --   ALBUMIN 3.5  --  3.2*  --   MG  --  2.2  --   --   AMMONIA 32  --   --   --      ------------------------------------------------------------------------------------------------------------------ No results for input(s): "CHOL", "HDL", "LDLCALC", "TRIG", "CHOLHDL", "LDLDIRECT" in the last 72 hours.  Lab Results  Component Value  Date   HGBA1C 5.5 12/09/2021   ------------------------------------------------------------------------------------------------------------------ No results for input(s): "TSH", "T4TOTAL", "T3FREE", "THYROIDAB" in the last 72 hours.  Invalid input(s): "FREET3"  Cardiac Enzymes No results for input(s): "CKMB", "TROPONINI", "MYOGLOBIN" in the last 168 hours.  Invalid input(s): "CK" ------------------------------------------------------------------------------------------------------------------ No results found for: "BNP"  CBG: Recent Labs  Lab 01/06/22 0732 01/07/22 0814 01/07/22 0902 01/08/22 1038 01/09/22 0803  GLUCAP 103* 57* 140* 128* 96     Recent Results (from the past 240 hour(s))  SARS Coronavirus 2 by RT PCR (hospital order, performed in [facility] hospital lab) *cepheid single result test*  Status: None   Collection Time: 01/04/22 12:51 AM   Specimen: Nasal Swab  Result Value Ref Range Status   SARS Coronavirus 2 by RT PCR NEGATIVE NEGATIVE Final    Comment: (NOTE) SARS-CoV-2 target nucleic acids are NOT DETECTED.  The SARS-CoV-2 RNA is generally detectable in upper and lower respiratory specimens during the acute phase of infection. The lowest concentration of SARS-CoV-2 viral copies this assay can detect is 250 copies / mL. A negative result does not preclude SARS-CoV-2 infection and should not be used as the sole basis for treatment or other patient management decisions.  A negative result may occur with improper specimen collection / handling, submission of specimen other than nasopharyngeal swab, presence of viral mutation(s) within the areas targeted by this assay, and inadequate number of viral copies (<250 copies / mL). A negative result must be combined with clinical observations, patient history, and epidemiological information.  Fact Sheet for Patients:   RoadLapTop.co.za  Fact Sheet for Healthcare  Providers: http://kim-miller.com/  This test is not yet approved or  cleared by the [place] FDA and has been authorized for detection and/or diagnosis of SARS-CoV-2 by FDA under an Emergency Use Authorization (EUA).  This EUA will remain in effect (meaning this test can be used) for the duration of the COVID-19 declaration under Section 564(b)(1) of the Act, 21 U.S.C. section 360bbb-3(b)(1), unless the authorization is terminated or revoked sooner.  Performed at [facility], [address] [place], [place] [address]   Urine Culture     Status: Abnormal   Collection Time: 01/04/22 12:53 AM   Specimen: Urine, Clean Catch  Result Value Ref Range Status   Specimen Description   Final    URINE, CLEAN CATCH Performed at [facility], [address] [place], [place] [address]    Special Requests   Final    NONE Performed at [facility], [address] [place], [place] [address]    Culture >=100,000 COLONIES/mL ESCHERICHIA COLI (A)  Final   Report Status 01/06/2022 FINAL  Final   Organism ID, Bacteria ESCHERICHIA COLI (A)  Final      Susceptibility   Escherichia coli - MIC*    AMPICILLIN <=2 SENSITIVE Sensitive     CEFAZOLIN <=4 SENSITIVE Sensitive     CEFEPIME <=0.12 SENSITIVE Sensitive     CEFTRIAXONE <=0.25 SENSITIVE Sensitive     CIPROFLOXACIN >=4 RESISTANT Resistant     GENTAMICIN <=1 SENSITIVE Sensitive     IMIPENEM <=0.25 SENSITIVE Sensitive     NITROFURANTOIN <=16 SENSITIVE Sensitive     TRIMETH/SULFA <=20 SENSITIVE Sensitive     AMPICILLIN/SULBACTAM <=2 SENSITIVE Sensitive     PIP/TAZO <=4 SENSITIVE Sensitive     * >=100,000 COLONIES/mL ESCHERICHIA COLI  MRSA Next Gen by PCR, Nasal     Status: None   Collection Time: 01/04/22  5:52 AM   Specimen: Nasal Mucosa; Nasal Swab  Result Value Ref Range Status   MRSA by PCR Next Gen NOT DETECTED NOT DETECTED Final    Comment: (NOTE) The  GeneXpert MRSA Assay (FDA approved for NASAL specimens only), is one component of a comprehensive MRSA colonization surveillance program. It is not intended to diagnose MRSA infection nor to guide or monitor treatment for MRSA infections. Test performance is not FDA approved in patients less than 81 years old. Performed at [facility], [address] [place], [place] [address]      Radiology Studies: No results found.  Marzetta Board, MD, PhD [facility]  Between 7 am - 7 pm I am available, please contact me via Amion (for emergencies) or Securechat (non urgent messages)  Between 7 pm - 7 am I am not available, please contact night coverage MD/APP via Amion

## 2022-01-09 NOTE — [place] FL2 (Signed)
[place] MEDICAID FL2 LEVEL OF CARE SCREENING TOOL     IDENTIFICATION  Patient Name: Deborah [name] Birthdate: 10/05/1945 Sex: female Admission Date (Current Location): 01/03/2022  [place] and [place] Number:  Haynes Bast [id number] R Facility and Address:  [facility],  [address], [place] [address]      Provider Number: [id number]  Attending Physician Name and Address:  Leatha Gilding, MD  Relative Name and Phone Number:  Niece, Erline Levine [phone])    Current Level of Care: Hospital Recommended Level of Care: Memory Care Prior Approval Number:    Date Approved/Denied:   PASRR Number: [id number]  Discharge Plan: Other (Comment) (Memory Care)    Current Diagnoses: Patient Active Problem List   Diagnosis Date Noted   Hypoglycemia 01/07/2022   Delirium    UTI (urinary tract infection) 01/04/2022   Protein-calorie malnutrition, severe 12/20/2021   Mood disorder (HCC) 12/14/2021   Cerebrovascular accident (CVA) of left thalamus (HCC) 12/09/2021   Substance abuse (HCC)    Acute metabolic encephalopathy 12/08/2021   Syncope 03/29/2019   Loss of consciousness (HCC) 07/06/2017   Loss of weight 04/30/2016   Lower back pain 03/04/2016   Atopic dermatitis 10/10/2015   History of CVA (cerebrovascular accident) 09/05/2015   Health maintenance examination 10/29/2011   Primary osteoarthritis of both knees 02/28/2011   Depression 08/10/2009   DJD (degenerative joint disease) 07/03/2006   Essential hypertension 06/08/2006   ASTHMA 06/08/2006   TAH/BSO, HX OF 06/08/2006    Orientation RESPIRATION BLADDER Height & Weight     Self  Normal Continent, External catheter Weight: 124 lb 9 oz (56.5 kg) Height:  5\' 6"  (167.6 cm)  BEHAVIORAL SYMPTOMS/MOOD NEUROLOGICAL BOWEL NUTRITION STATUS  Dangerous to self, others or property   Continent Diet (heart healthy)  AMBULATORY STATUS COMMUNICATION OF NEEDS Skin   Extensive Assist Verbally Normal                        Personal Care Assistance Level of Assistance  Bathing, Feeding, Dressing Bathing Assistance: Limited assistance Feeding assistance: Limited assistance Dressing Assistance: Limited assistance     Functional Limitations Info  Sight, Hearing, Speech Sight Info: Adequate Hearing Info: Adequate Speech Info: Adequate    SPECIAL CARE FACTORS FREQUENCY                       Contractures Contractures Info: Not present    Additional Factors Info  Code Status, Allergies, Psychotropic Code Status Info: FULL Allergies Info: No Known Allergies Psychotropic Info: See MAR         Current Medications (01/09/2022):  This is the current hospital active medication list Current Facility-Administered Medications  Medication Dose Route Frequency Provider Last Rate Last Admin   acetaminophen (TYLENOL) tablet 650 mg  650 mg Oral Q6H PRN 01/11/2022, MD       Or   acetaminophen (TYLENOL) suppository 650 mg  650 mg Rectal Q6H PRN Lurline Del, MD       albuterol (PROVENTIL) (2.5 MG/3ML) 0.083% nebulizer solution 2.5 mg  2.5 mg Nebulization Q2H PRN Lurline Del, MD       amLODipine (NORVASC) tablet 5 mg  5 mg Oral Daily Lurline Del A, MD   5 mg at 01/09/22 1127   aspirin tablet 325 mg  325 mg Oral Daily 01/11/22 A, MD   325 mg at 01/09/22 1127   cholecalciferol (VITAMIN D3) tablet 1,000 Units  1,000 Units  Oral Daily Skip Mayer A, MD   1,000 Units at 01/09/22 1128   enoxaparin (LOVENOX) injection 40 mg  40 mg Subcutaneous Q24H Leatha Gilding, MD   40 mg at 01/08/22 1613   feeding supplement (ENSURE ENLIVE / ENSURE PLUS) liquid 237 mL  237 mL Oral TID BM Skip Mayer A, MD   237 mL at 01/07/22 2131   FLUoxetine (PROZAC) capsule 20 mg  20 mg Oral Daily Skip Mayer A, MD   20 mg at 01/09/22 1127   haloperidol lactate (HALDOL) injection 5 mg  5 mg Intravenous Q6H PRN Marlin Canary U, DO   5 mg at 01/08/22 0003   hydrALAZINE  (APRESOLINE) injection 5 mg  5 mg Intravenous Q4H PRN Eduard Clos, MD   5 mg at 01/05/22 2208   lisinopril (ZESTRIL) tablet 20 mg  20 mg Oral Daily Skip Mayer A, MD   20 mg at 01/09/22 1127   LORazepam (ATIVAN) tablet 1 mg  1 mg Oral Q4H PRN Marlin Canary U, DO   1 mg at 01/08/22 2112   memantine (NAMENDA) tablet 5 mg  5 mg Oral BID Skip Mayer A, MD   5 mg at 01/09/22 1127   metoprolol tartrate (LOPRESSOR) injection 5 mg  5 mg Intravenous Q6H PRN Marlin Canary U, DO       nicotine (NICODERM CQ - dosed in mg/24 hours) patch 21 mg  21 mg Transdermal Daily Skip Mayer A, MD   21 mg at 01/09/22 1132   ondansetron (ZOFRAN) tablet 4 mg  4 mg Oral Q6H PRN Lurline Del, MD       Or   ondansetron [facility]) injection 4 mg  4 mg Intravenous Q6H PRN Lurline Del, MD       Oral care mouth rinse  15 mL Mouth Rinse PRN Lurline Del, MD       risperiDONE (RISPERDAL M-TABS) disintegrating tablet 0.5 mg  0.5 mg Oral BID PRN Massengill, Harrold Donath, MD       risperiDONE (RISPERDAL M-TABS) disintegrating tablet 1 mg  1 mg Oral Q12H Maryagnes Amos, FNP   1 mg at 01/09/22 1127   rosuvastatin (CRESTOR) tablet 10 mg  10 mg Oral Daily Skip Mayer A, MD   10 mg at 01/09/22 1127   senna-docusate (Senokot-S) tablet 1 tablet  1 tablet Oral QHS PRN Lurline Del, MD   1 tablet at 01/07/22 2133     Discharge Medications: Please see discharge summary for a list of discharge medications.  Relevant Imaging Results:  Relevant Lab Results:   Additional Information SS#: [id number]  Otelia Santee, LCSW

## 2022-01-09 NOTE — Evaluation (Signed)
Physical Therapy One Time Evaluation Patient Details Name: Deborah [name] MRN: [id number] DOB: 05-Jan-1946 Today's Date: 01/09/2022  History of Present Illness  PT is a 76 year old female admitted 01/03/22 for acute UTI and Acute metabolic encephalopathy, delirium/visual hallucination in a patient with recent left thalamic stroke, psychiatric disorder NOS and cocaine use disorder. Past medical history significant for anxiety, asthma, cocaine use, history of CVA, HTN, tobacco use, alcohol use who has interim history of admission 12/08/21-7/723 with discharge diagnosis of acute metabolic encephalopathy due to substance abuse in setting of new CVA of left thalamus.  Clinical Impression  Patient evaluated by Physical Therapy with no further acute PT needs identified. All education has been completed and the patient has no further questions.  Pt requested RW to ambulate and able to mobilize with min/guard assist for safety in hallway.  Pt did not require any physical assist.  Pt would benefit from more custodial level care however no skilled PT f/u recommended as pt is pleasantly confused and using RW to ambulate at this time. PT is signing off. Thank you for this referral.     Recommendations for follow up therapy are one component of a multi-disciplinary discharge planning process, led by the attending physician.  Recommendations may be updated based on patient status, additional functional criteria and insurance authorization.  Follow Up Recommendations No PT follow up Can patient physically be transported by private vehicle: Yes    Assistance Recommended at Discharge Intermittent Supervision/Assistance  Patient can return home with the following  A little help with walking and/or transfers;A little help with bathing/dressing/bathroom;Assist for transportation;Help with stairs or ramp for entrance;Direct supervision/assist for medications management;Assistance with cooking/housework    Equipment  Recommendations None recommended by PT  Recommendations for Other Services       Functional Status Assessment Patient has not had a recent decline in their functional status     Precautions / Restrictions Precautions Precautions: Fall      Mobility  Bed Mobility Overal bed mobility: Needs Assistance Bed Mobility: Supine to Sit     Supine to sit: Supervision          Transfers Overall transfer level: Needs assistance Equipment used: Rolling walker (2 wheels) Transfers: Sit to/from Stand Sit to Stand: Supervision                Ambulation/Gait Ambulation/Gait assistance: Min guard Gait Distance (Feet): 160 Feet Assistive device: Rolling walker (2 wheels) Gait Pattern/deviations: Step-through pattern, Decreased stride length, Drifts right/left Gait velocity: Decreased     General Gait Details: pt requested using RW and occasionally lifting RW to move walker over, pt drifting to left but self correcting; pt did not require any physical assist however provided min/guard for safety  Stairs            Wheelchair Mobility    Modified Rankin (Stroke Patients Only)       Balance Overall balance assessment: Needs assistance         Standing balance support: During functional activity, Reliant on assistive device for balance Standing balance-Leahy Scale: Poor                               Pertinent Vitals/Pain Pain Assessment Pain Assessment: Faces Faces Pain Scale: No hurt Pain Intervention(s): Monitored during session, Repositioned    Home Living Family/patient expects to be discharged to:: Skilled nursing facility Living Arrangements: Non-relatives/Friends Available Help at Discharge: Friend(s);Available PRN/intermittently Type  of Home: House Home Access: Stairs to enter   Secretary/administrator of Steps: 1   Home Layout: One level   Additional Comments: Pt is a poor historian, recent admission and discharged to SNF, above  from previous hospitalization    Prior Function Prior Level of Function : Independent/Modified Independent             Mobility Comments: Assume independent with mobility prior to previous hospitalization       Hand Dominance        Extremity/Trunk Assessment        Lower Extremity Assessment Lower Extremity Assessment: Generalized weakness       Communication   Communication: No difficulties  Cognition Arousal/Alertness: Awake/alert Behavior During Therapy: Impulsive Overall Cognitive Status: No family/caregiver present to determine baseline cognitive functioning                         Following Commands: Follows one step commands consistently, Follows one step commands with increased time     Problem Solving: Slow processing, Difficulty sequencing, Requires verbal cues General Comments: pt pleasantly confused, following simple 1 step commands with increased time        General Comments      Exercises     Assessment/Plan    PT Assessment Patient does not need any further PT services  PT Problem List         PT Treatment Interventions      PT Goals (Current goals can be found in the Care Plan section)  Acute Rehab PT Goals PT Goal Formulation: All assessment and education complete, DC therapy    Frequency       Co-evaluation               AM-PAC PT "6 Clicks" Mobility  Outcome Measure Help needed turning from your back to your side while in a flat bed without using bedrails?: None Help needed moving from lying on your back to sitting on the side of a flat bed without using bedrails?: None Help needed moving to and from a bed to a chair (including a wheelchair)?: A Little Help needed standing up from a chair using your arms (e.g., wheelchair or bedside chair)?: A Little Help needed to walk in hospital room?: A Little Help needed climbing 3-5 steps with a railing? : A Little 6 Click Score: 20    End of Session Equipment  Utilized During Treatment: Gait belt Activity Tolerance: Patient tolerated treatment well Patient left: in chair;with call bell/phone within reach;with chair alarm set Nurse Communication: Mobility status PT Visit Diagnosis: Difficulty in walking, not elsewhere classified (R26.2)    Time: 1607-3710 PT Time Calculation (min) (ACUTE ONLY): 16 min   Charges:   PT Evaluation $PT Eval Low Complexity: 1 Low         Deborah [name] PT, DPT Physical Therapist Acute Rehabilitation Services Preferred contact method: Secure Chat Weekend Pager Only: [phone] Office: [phone]   Deborah [name] 01/09/2022, 3:48 PM

## 2022-01-09 NOTE — Care Management Important Message (Signed)
Important Message  Patient Details IM Letter placed in Patients room. Name: Deborah [name] MRN: [id number] Date of Birth: Sep 28, 1945   Medicare Important Message Given:  Yes     Caren Macadam 01/09/2022, 10:50 AM

## 2022-01-09 NOTE — TOC Progression Note (Addendum)
Transition of Care [facility]) - Progression Note    Patient Details  Name: Deborah [name] MRN: [id number] Date of Birth: July 01, 1945  Transition of Care [facility]) CM/SW Contact  Otelia Santee, LCSW Phone Number: 01/09/2022, 11:33 AM  Clinical Narrative:    Spoke with [facility] who are now saying this pt is unable to return to their facility due level of care she requires. Pt will need to be evaluated by PT/OT for possible SNF placement at another facility.   CSW spoke with pt's Niece, Erline Levine ([phone]) who states that she is currently in [place] and will not be returning to [place] until 02/13/22. She states that this pt has no one she can stay with and no family that can assist with caring for this pt. CSW encouraged niece to begin searching for memory care/LTC for this pt now and provided her with information for [facility] as [facility] recommended this facility for this pt.   Update 1245: Pt's niece was able to speak with [facility] who have a bed available for this pt at their memory care unit. Pt will need chest xray completed stating that their are no findings of TB or active disease. CSW started insurance auth for SNF as this pt may also be able to receive needed PT from [facility] while on memory care unit. Pt is able to transfer to facility 01/10/22 if all requirements are completed.   Update 1400: Faxed chest xray to [facility].  Expected Discharge Plan: Skilled Nursing Facility Barriers to Discharge: No Barriers Identified  Expected Discharge Plan and Services Expected Discharge Plan: Skilled Nursing Facility In-house Referral: Clinical Social Work Discharge Planning Services: CM Consult Post Acute Care Choice: Skilled Nursing Facility                   DME Arranged: N/A DME Agency: NA                   Social Determinants of Health (SDOH) Interventions    Readmission Risk Interventions    01/04/2022   10:37 AM  Readmission Risk  Prevention Plan  Transportation Screening Complete  PCP or Specialist Appt within 3-5 Days Complete  HRI or Home Care Consult Complete  Social Work Consult for Recovery Care Planning/Counseling Complete  Palliative Care Screening Not Applicable  Medication Review Oceanographer) Complete

## 2022-01-10 DIAGNOSIS — N390 Urinary tract infection, site not specified: Secondary | ICD-10-CM | POA: Diagnosis not present

## 2022-01-10 LAB — GLUCOSE, CAPILLARY: Glucose-Capillary: 87 mg/dL (ref 70–99)

## 2022-01-10 MED ORDER — RISPERIDONE 1 MG PO TBDP: 1.0000 mg | ORAL_TABLET | Freq: Two times a day (BID) | ORAL | 0 refills | Status: AC

## 2022-01-10 MED ORDER — LORAZEPAM 0.5 MG PO TABS: 0.5000 mg | ORAL_TABLET | Freq: Four times a day (QID) | ORAL | 0 refills | Status: AC | PRN

## 2022-01-10 NOTE — Plan of Care (Signed)
  Problem: Education: Goal: Knowledge of General Education information will improve Description: Including pain rating scale, medication(s)/side effects and non-pharmacologic comfort measures Outcome: Adequate for Discharge   Problem: Health Behavior/Discharge Planning: Goal: Ability to manage health-related needs will improve Outcome: Adequate for Discharge   Problem: Coping: Goal: Level of anxiety will decrease Outcome: Adequate for Discharge   Problem: Safety: Goal: Ability to remain free from injury will improve Outcome: Adequate for Discharge   Problem: Skin Integrity: Goal: Risk for impaired skin integrity will decrease Outcome: Adequate for Discharge   Problem: Urinary Elimination: Goal: Signs and symptoms of infection will decrease Outcome: Adequate for Discharge   Problem: Safety: Goal: Non-violent Restraint(s) Outcome: Adequate for Discharge

## 2022-01-10 NOTE — Plan of Care (Signed)
  Problem: Education: Goal: Knowledge of General Education information will improve Description: Including pain rating scale, medication(s)/side effects and non-pharmacologic comfort measures Outcome: Progressing   Problem: Health Behavior/Discharge Planning: Goal: Ability to manage health-related needs will improve Outcome: Progressing   Problem: Coping: Goal: Level of anxiety will decrease Outcome: Progressing   Problem: Safety: Goal: Ability to remain free from injury will improve Outcome: Progressing   Problem: Skin Integrity: Goal: Risk for impaired skin integrity will decrease Outcome: Progressing   Problem: Urinary Elimination: Goal: Signs and symptoms of infection will decrease Outcome: Progressing   Problem: Safety: Goal: Non-violent Restraint(s) Outcome: Progressing

## 2022-01-10 NOTE — Evaluation (Signed)
Occupational Therapy Evaluation Patient Details Name: Deborah [name] MRN: [id number] DOB: [birth year] Today's Date: 01/10/2022   History of Present Illness PT is a 76 year old female admitted 01/03/22 for acute UTI and Acute metabolic encephalopathy, delirium/visual hallucination in a patient with recent left thalamic stroke, psychiatric disorder NOS and cocaine use disorder. Past medical history significant for anxiety, asthma, cocaine use, history of CVA, HTN, tobacco use, alcohol use who has interim history of admission 12/08/21-7/723 with discharge diagnosis of acute metabolic encephalopathy due to substance abuse in setting of new CVA of left thalamus.   Clinical Impression   Deborah [name] is a 76 year old woman who presents with above medical history. On evaluation she presents with continued impaired cognition and she is only alert to self. She is able to follow commands but she needs verbal cues for all tasks. She requires min guard to transfer and ambulate with a RW and min guard to min assist for ADLs with frequent verbal instruction. She was able to perform toileting, wash her face and perform partial LB bathing with instruction and cues to improve quality. Therapist agrees for recommendation of Memory Care - as patient requires more supervision assistance due to cognitive deficits than physical assistance. No further acute care OT needs.      Recommendations for follow up therapy are one component of a multi-disciplinary discharge planning process, led by the attending physician.  Recommendations may be updated based on patient status, additional functional criteria and insurance authorization.   Follow Up Recommendations  Long-term institutional care without follow-up therapy    Assistance Recommended at Discharge Frequent or constant Supervision/Assistance  Patient can return home with the following A little help with walking and/or transfers;A little help with  bathing/dressing/bathroom;Assistance with cooking/housework;Direct supervision/assist for financial management;Help with stairs or ramp for entrance;Direct supervision/assist for medications management    Functional Status Assessment  Patient has had a recent decline in their functional status and/or demonstrates limited ability to make significant improvements in function in a reasonable and predictable amount of time  Equipment Recommendations  None recommended by OT    Recommendations for Other Services       Precautions / Restrictions Precautions Precautions: Fall Restrictions Weight Bearing Restrictions: No      Mobility Bed Mobility                    Transfers                          Balance Overall balance assessment: Mild deficits observed, not formally tested                                         ADL either performed or assessed with clinical judgement   ADL Overall ADL's : Needs assistance/impaired Eating/Feeding: Independent   Grooming: Standing;Wash/dry hands;Cueing for sequencing;Supervision/safety Grooming Details (indicate cue type and reason): stood at sink to wash face and hands Upper Body Bathing: Set up;Sitting;Cueing for sequencing   Lower Body Bathing: Sit to/from stand;Min guard;Supervison/ safety Lower Body Bathing Details (indicate cue type and reason): standing bathing task at sink to wash pericare. Patient  able to physically perform with verbal cues Upper Body Dressing : Minimal assistance;Sitting   Lower Body Dressing: Minimal assistance;Sit to/from stand;Cueing for sequencing   Toilet Transfer: Min guard;Rolling walker (2 wheels);BSC/3in1;Cueing for sequencing Statistician Details (  indicate cue type and reason): ambulated to bathroom Toileting- Clothing Manipulation and Hygiene: Min guard;Sit to/from stand;Cueing for sequencing       Functional mobility during ADLs: Min guard;Rolling walker (2  wheels) General ADL Comments: ambulated to bathroom with RW.     Vision   Vision Assessment?: No apparent visual deficits     Perception     Praxis      Pertinent Vitals/Pain Pain Assessment Pain Assessment: Faces Faces Pain Scale: No hurt     Hand Dominance     Extremity/Trunk Assessment Upper Extremity Assessment Upper Extremity Assessment: Overall WFL for tasks assessed           Communication Communication Communication: No difficulties   Cognition Arousal/Alertness: Awake/alert Behavior During Therapy: WFL for tasks assessed/performed Overall Cognitive Status: No family/caregiver present to determine baseline cognitive functioning Area of Impairment: Orientation, Attention, Memory, Following commands, Safety/judgement, Awareness, Problem solving                 Orientation Level: Disoriented to, Time, Situation, Place Current Attention Level: Sustained Memory: Decreased short-term memory Following Commands: Follows one step commands consistently Safety/Judgement: Decreased awareness of safety, Decreased awareness of deficits Awareness: Intellectual Problem Solving: Slow processing, Difficulty sequencing, Requires verbal cues General Comments: ALert to self only. Able to follow commands. Needs verbal cues     General Comments       Exercises     Shoulder Instructions      Home Living Family/patient expects to be discharged to:: Skilled nursing facility Living Arrangements: Non-relatives/Friends Available Help at Discharge: Friend(s);Available PRN/intermittently Type of Home: House Home Access: Stairs to enter Entrance Stairs-Number of Steps: 1   Home Layout: One level                   Additional Comments: Pt is a poor historian, recent admission and discharged to SNF, above from previous hospitalization      Prior Functioning/Environment Prior Level of Function : Independent/Modified Independent             Mobility  Comments: Assume independent with mobility prior to previous hospitalization          OT Problem List: Impaired balance (sitting and/or standing);Decreased safety awareness;Decreased knowledge of use of DME or AE;Decreased cognition      OT Treatment/Interventions:      OT Goals(Current goals can be found in the care plan section) Acute Rehab OT Goals OT Goal Formulation: All assessment and education complete, DC therapy  OT Frequency:      Co-evaluation              AM-PAC OT "6 Clicks" Daily Activity     Outcome Measure Help from another person eating meals?: None Help from another person taking care of personal grooming?: A Little Help from another person toileting, which includes using toliet, bedpan, or urinal?: A Little Help from another person bathing (including washing, rinsing, drying)?: A Little Help from another person to put on and taking off regular upper body clothing?: A Little Help from another person to put on and taking off regular lower body clothing?: A Little 6 Click Score: 19   End of Session Equipment Utilized During Treatment: Rolling walker (2 wheels) Nurse Communication: Mobility status  Activity Tolerance: Patient tolerated treatment well Patient left: in bed;with call bell/phone within reach;with bed alarm set  OT Visit Diagnosis: Other symptoms and signs involving cognitive function  Time: 4680-3212 OT Time Calculation (min): 13 min Charges:  OT General Charges $OT Visit: 1 Visit OT Evaluation $OT Eval Low Complexity: 1 Low  Joaquim Tolen, OTR/L Acute Care Rehab Services  Office [phone] Pager: [phone]   Kelli Churn 01/10/2022, 11:10 AM

## 2022-01-10 NOTE — Discharge Summary (Signed)
Physician Discharge Summary  Deborah [name] JME:[id number] DOB: 07/19/1945 DOA: 01/03/2022  PCP: Pcp, No  Admit date: 01/03/2022 Discharge date: 01/10/2022  Admitted From: SNF Disposition:  SNF  Recommendations for Outpatient Follow-up:  Follow up with PCP in 1-2 weeks  Home Health: none Equipment/Devices: none  Discharge Condition: stable CODE STATUS: Full code Diet Orders (From admission, onward)     Start     Ordered   01/05/22 1242  Diet regular Room service appropriate? Yes; Fluid consistency: Thin  Diet effective now       Question Answer Comment  Room service appropriate? Yes   Fluid consistency: Thin      01/05/22 1241            HPI: Per admitting MD, Deborah [name] is a 76 y.o. female with medical history significant of  anxiety, asthma, cocaine use, history of CVA, HTN, tobacco use, alcohol use who has interim history of admission 12/08/21-7/723 with discharge diagnosis of acute metabolic encephalopathy due to substance abuse in setting of new   CVA of left thalamus. Patient during this hospitalization was also noted to have behavioral disturbances labeled as mood disorder nos vs substance induced psych d/o. Due to patient behavioral disturbances and  mental status, patient was transitioned to supervised setting on discharge. Patient now presents to ED BIBEMS from NH due to increase agitation  and change in MS at [facility].  Of note in the filed patient per EMS was noted to be combative and as medicated for transport with good effect. Patient currently lethargic only alert to self and place.  Patient is unable to give history at this history is taken from chart review. Of note on partial ros patient noted no pain , sob .  Hospital Course / Discharge diagnoses: Principal Problem:   UTI (urinary tract infection) Active Problems:   Delirium   Hypoglycemia   Principal problem Acute UTI- Urine culture is growing E. coli.  She completed treatment with ceftriaxone while  hospitalized.   Active problems Acute metabolic encephalopathy, delirium/visual hallucination in a patient with recent left thalamic stroke, psychiatric disorder NOS and cocaine use disorder- continue supportive care.  Continue aspirin and rosuvastatin. Had similar issues last hospitalization.  Psychiatry consulted, discontinued Zyprexa and started patient on risperidone.  With this medication change her mental status has improved significantly, she is calm, and has no further agitation issues.  Hypoglycemia -resolved with improved p.o. intake Abnormal EKG, LVH with secondary repolarization changes, minimally elevated troponins-No chest pain.  Minimal elevated troponins is likely from demand ischemia. Hypertension-continue amlodipine, lisinopril Substance abuse-prior to her June hospitalization.  UDS was positive for cocaine History of pericardial effusion-seen during prior hospital stay.  Clinically without issues Severe protein calorie malnutrition-as evidenced by severe fat depletion, severe muscle depletion.  Continue nutritional supplements  Sepsis ruled out   Discharge Instructions   Allergies as of 01/10/2022   No Known Allergies      Medication List     STOP taking these medications    OLANZapine 10 MG tablet Commonly known as: ZYPREXA       TAKE these medications    amLODipine 5 MG tablet Commonly known as: NORVASC Take 1 tablet (5 mg total) by mouth daily.   aspirin 325 MG tablet Take 1 tablet (325 mg total) by mouth daily.   diphenhydrAMINE 25 mg capsule Commonly known as: BENADRYL Take 25 mg by mouth every 6 (six) hours as needed for itching.   feeding supplement Liqd Take 237 mLs  by mouth 3 (three) times daily between meals.   FLUoxetine 20 MG capsule Commonly known as: PROZAC Take 20 mg by mouth daily.   lisinopril 20 MG tablet Commonly known as: ZESTRIL Take 1 tablet (20 mg total) by mouth daily.   LORazepam 0.5 MG tablet Commonly known as:  ATIVAN Take 1 tablet (0.5 mg total) by mouth every 6 (six) hours as needed (agitation).   melatonin 3 MG Tabs tablet Take 3 mg by mouth at bedtime as needed (sleep).   memantine 5 MG tablet Commonly known as: NAMENDA Take 5 mg by mouth 2 (two) times daily.   nicotine 21 mg/24hr patch Commonly known as: NICODERM CQ - dosed in mg/24 hours Place 1 patch (21 mg total) onto the skin daily.   risperiDONE 1 MG disintegrating tablet Commonly known as: RISPERDAL M-TABS Take 1 tablet (1 mg total) by mouth every 12 (twelve) hours.   rosuvastatin 10 MG tablet Commonly known as: CRESTOR Take 1 tablet (10 mg total) by mouth daily.   Vitamin D (Cholecalciferol) 25 MCG (1000 UT) Tabs Take 1 tablet by mouth daily.        Contact information for after-discharge care     Destination     [facility] .   Service: Skilled Nursing Contact information: [address] [place] [address] [phone]                     DG CHEST PORT 1 VIEW  Result Date: 01/09/2022 CLINICAL DATA:  Hypertension, asthma. EXAM: PORTABLE CHEST 1 VIEW COMPARISON:  January 04, 2022. FINDINGS: Stable cardiomediastinal silhouette. Both lungs are clear. The visualized skeletal structures are unremarkable. IMPRESSION: No active disease. Electronically Signed   By: Lupita Raider M.D.   On: 01/09/2022 13:25   DG CHEST PORT 1 VIEW  Result Date: 01/04/2022 CLINICAL DATA:  UTI, altered mental status. EXAM: PORTABLE CHEST 1 VIEW COMPARISON:  Portable chest 12/08/2021 FINDINGS: The patient is rotated to the right. There is mild cardiomegaly without evidence of CHF. There is mild aortic tortuosity with unremarkable mediastinum. The lungs hypoexpanded but generally clear. No pleural effusion is seen. Multilevel thoracic spine bridging enthesopathy on the right. IMPRESSION: No acute chest findings. Limited view of the bases due to low lung volumes. Mild cardiomegaly. Electronically  Signed   By: Almira Bar M.D.   On: 01/04/2022 04:57   CT Head Wo Contrast  Result Date: 01/04/2022 CLINICAL DATA:  Delirium. EXAM: CT HEAD WITHOUT CONTRAST TECHNIQUE: Contiguous axial images were obtained from the base of the skull through the vertex without intravenous contrast. RADIATION DOSE REDUCTION: This exam was performed according to the departmental dose-optimization program which includes automated exposure control, adjustment of the mA and/or kV according to patient size and/or use of iterative reconstruction technique. COMPARISON:  CT 12/08/2021, MRI 06/19/20233 FINDINGS: Brain: No acute intracranial hemorrhage. Expected evolution of lacunar infarct in the left thalamus from prior CT. Stable brain volume. Advanced periventricular and deep white matter hypodensity consistent with chronic small vessel ischemia. No hydrocephalus, midline shift, or mass effect. No subdural or extra-axial collection. Vascular: Atherosclerosis of skullbase vasculature without hyperdense vessel or abnormal calcification. Skull: No fracture or focal lesion. Sinuses/Orbits: Minor mucosal thickening of ethmoid air cells. No sinus fluid levels. No mastoid effusion. Other: None. IMPRESSION: 1. No acute intracranial abnormality. 2. Expected evolution of lacunar infarct in the left thalamus from prior CT. Stable brain volume loss and chronic small vessel ischemia. Electronically Signed  By: Narda Rutherford M.D.   On: 01/04/2022 00:05     Subjective: - no chest pain, shortness of breath, no abdominal pain, nausea or vomiting.   Discharge Exam: BP 121/67 (BP Location: Left Arm)   Pulse 82   Temp (!) 97.5 F (36.4 C) (Axillary)   Resp 16   Ht 5\' 6"  (1.676 m)   Wt 54.3 kg   SpO2 100%   BMI 19.34 kg/m   General: Pt is alert, awake, not in acute distress Cardiovascular: RRR, S1/S2 +, no rubs, no gallops Respiratory: CTA bilaterally, no wheezing, no rhonchi Abdominal: Soft, NT, ND, bowel sounds + Extremities:  no edema, no cyanosis    The results of significant diagnostics from this hospitalization (including imaging, microbiology, ancillary and laboratory) are listed below for reference.     Microbiology: Recent Results (from the past 240 hour(s))  SARS Coronavirus 2 by RT PCR (hospital order, performed in [facility] hospital lab) *cepheid single result test*     Status: None   Collection Time: 01/04/22 12:51 AM   Specimen: Nasal Swab  Result Value Ref Range Status   SARS Coronavirus 2 by RT PCR NEGATIVE NEGATIVE Final    Comment: (NOTE) SARS-CoV-2 target nucleic acids are NOT DETECTED.  The SARS-CoV-2 RNA is generally detectable in upper and lower respiratory specimens during the acute phase of infection. The lowest concentration of SARS-CoV-2 viral copies this assay can detect is 250 copies / mL. A negative result does not preclude SARS-CoV-2 infection and should not be used as the sole basis for treatment or other patient management decisions.  A negative result may occur with improper specimen collection / handling, submission of specimen other than nasopharyngeal swab, presence of viral mutation(s) within the areas targeted by this assay, and inadequate number of viral copies (<250 copies / mL). A negative result must be combined with clinical observations, patient history, and epidemiological information.  Fact Sheet for Patients:   01/06/22  Fact Sheet for Healthcare Providers: RoadLapTop.co.za  This test is not yet approved or  cleared by the http://kim-miller.com/ FDA and has been authorized for detection and/or diagnosis of SARS-CoV-2 by FDA under an Emergency Use Authorization (EUA).  This EUA will remain in effect (meaning this test can be used) for the duration of the COVID-19 declaration under Section 564(b)(1) of the Act, 21 U.S.C. section 360bbb-3(b)(1), unless the authorization is terminated or revoked  sooner.  Performed at [facility], [address], [place], [place]   Urine Culture     Status: Abnormal   Collection Time: 01/04/22 12:53 AM   Specimen: Urine, Clean Catch  Result Value Ref Range Status   Specimen Description   Final    URINE, CLEAN CATCH Performed at [facility], [address] [place], [place]    Special Requests   Final    NONE Performed at [facility], [address], [place], [place]    Culture >=100,000 COLONIES/mL ESCHERICHIA COLI (A)  Final   Report Status 01/06/2022 FINAL  Final   Organism ID, Bacteria ESCHERICHIA COLI (A)  Final      Susceptibility   Escherichia coli - MIC*    AMPICILLIN <=2 SENSITIVE Sensitive     CEFAZOLIN <=4 SENSITIVE Sensitive     CEFEPIME <=0.12 SENSITIVE Sensitive     CEFTRIAXONE <=0.25 SENSITIVE Sensitive     CIPROFLOXACIN >=4 RESISTANT Resistant     GENTAMICIN <=1 SENSITIVE Sensitive  IMIPENEM <=0.25 SENSITIVE Sensitive     NITROFURANTOIN <=16 SENSITIVE Sensitive     TRIMETH/SULFA <=20 SENSITIVE Sensitive     AMPICILLIN/SULBACTAM <=2 SENSITIVE Sensitive     PIP/TAZO <=4 SENSITIVE Sensitive     * >=100,000 COLONIES/mL ESCHERICHIA COLI  MRSA Next Gen by PCR, Nasal     Status: None   Collection Time: 01/04/22  5:52 AM   Specimen: Nasal Mucosa; Nasal Swab  Result Value Ref Range Status   MRSA by PCR Next Gen NOT DETECTED NOT DETECTED Final    Comment: (NOTE) The GeneXpert MRSA Assay (FDA approved for NASAL specimens only), is one component of a comprehensive MRSA colonization surveillance program. It is not intended to diagnose MRSA infection nor to guide or monitor treatment for MRSA infections. Test performance is not FDA approved in patients less than 78 years old. Performed at [facility], [address] [place], [place] [id number]      Labs: Basic Metabolic Panel: Recent Labs  Lab 01/03/22 2338  01/03/22 2346 01/04/22 0531 01/08/22 0545  NA 142  --  142 134*  K 4.0  --  3.6 4.4  CL 107  --  110 101  CO2 27  --  26 26  GLUCOSE 113*  --  86 103*  BUN 34*  --  26* 35*  CREATININE 0.97  --  0.78 0.87  CALCIUM 8.9  --  9.2 9.1  MG  --  2.2  --   --    Liver Function Tests: Recent Labs  Lab 01/03/22 2338 01/04/22 0531  AST 16 17  ALT 15 14  ALKPHOS 75 69  BILITOT 0.4 0.5  PROT 7.0 6.5  ALBUMIN 3.5 3.2*   CBC: Recent Labs  Lab 01/03/22 2338 01/04/22 0531 01/08/22 0545  WBC 7.0 6.1 6.8  NEUTROABS 5.2  --   --   HGB 9.7* 9.8* 11.3*  HCT 29.7* 29.8* 36.2  MCV 96.7 97.1 100.3*  PLT 412* 340 409*   CBG: Recent Labs  Lab 01/07/22 0814 01/07/22 0902 01/08/22 1038 01/09/22 0803 01/10/22 0753  GLUCAP 57* 140* 128* 96 87   Hgb A1c No results for input(s): "HGBA1C" in the last 72 hours. Lipid Profile No results for input(s): "CHOL", "HDL", "LDLCALC", "TRIG", "CHOLHDL", "LDLDIRECT" in the last 72 hours. Thyroid function studies No results for input(s): "TSH", "T4TOTAL", "T3FREE", "THYROIDAB" in the last 72 hours.  Invalid input(s): "FREET3" Urinalysis    Component Value Date/Time   COLORURINE YELLOW 01/04/2022 0053   APPEARANCEUR HAZY (A) 01/04/2022 0053   LABSPEC 1.018 01/04/2022 0053   PHURINE 5.0 01/04/2022 0053   GLUCOSEU NEGATIVE 01/04/2022 0053   HGBUR MODERATE (A) 01/04/2022 0053   BILIRUBINUR NEGATIVE 01/04/2022 0053   KETONESUR NEGATIVE 01/04/2022 0053   PROTEINUR NEGATIVE 01/04/2022 0053   UROBILINOGEN 0.2 11/10/2010 1247   NITRITE POSITIVE (A) 01/04/2022 0053   LEUKOCYTESUR LARGE (A) 01/04/2022 0053    FURTHER DISCHARGE INSTRUCTIONS:   Get Medicines reviewed and adjusted: Please take all your medications with you for your next visit with your Primary MD   Laboratory/radiological data: Please request your Primary MD to go over all hospital tests and procedure/radiological results at the follow up, please ask your Primary MD to get all  Hospital records sent to his/her office.   In some cases, they will be blood work, cultures and biopsy results pending at the time of your discharge. Please request that your primary care M.D. goes through all the records of your hospital  data and follows up on these results.   Also Note the following: If you experience worsening of your admission symptoms, develop shortness of breath, life threatening emergency, suicidal or homicidal thoughts you must seek medical attention immediately by calling 911 or calling your MD immediately  if symptoms less severe.   You must read complete instructions/literature along with all the possible adverse reactions/side effects for all the Medicines you take and that have been prescribed to you. Take any new Medicines after you have completely understood and accpet all the possible adverse reactions/side effects.    Do not drive when taking Pain medications or sleeping medications (Benzodaizepines)   Do not take more than prescribed Pain, Sleep and Anxiety Medications. It is not advisable to combine anxiety,sleep and pain medications without talking with your primary care practitioner   Special Instructions: If you have smoked or chewed Tobacco  in the last 2 yrs please stop smoking, stop any regular Alcohol  and or any Recreational drug use.   Wear Seat belts while driving.   Please note: You were cared for by a hospitalist during your hospital stay. Once you are discharged, your primary care physician will handle any further medical issues. Please note that NO REFILLS for any discharge medications will be authorized once you are discharged, as it is imperative that you return to your primary care physician (or establish a relationship with a primary care physician if you do not have one) for your post hospital discharge needs so that they can reassess your need for medications and monitor your lab values.  Time coordinating discharge: 35  minutes  SIGNED:  Pamella Pert, MD, PhD 01/10/2022, 10:16 AM

## 2022-01-10 NOTE — Consult Note (Addendum)
[facility] Face-to-Face Psychiatry Consult   Reason for Consult: Hallucinations/agitation Referring Physician:  Larae Grooms MD Patient Identification: Deborah [name] MRN:  [id number] Principal Diagnosis: UTI (urinary tract infection) Diagnosis:  Principal Problem:   UTI (urinary tract infection) Active Problems:   Delirium   Hypoglycemia   Total Time spent with patient: 15 minutes  Subjective:   Deborah [name] is a 76 y.o. female who presents with a charted history of anxiety, cocaine abuse, hypertension and asthma.  Chart review patient has previous inpatient admissions due to acute metabolic encephalopathy was noted patient was experiencing paranoia and auditory hallucinations.  Patient has been evaluated by psychiatry during this inpatient admission due to management of urinary tract infection with reported symptoms of delirium.  Attending psychiatrist recommended discontinuing Zyprexa for paranoia.  Consider restarting respite all 0.5 mg p.o. twice daily for agitation continue cardiac monitoring for prolonged QT.  Patient to continue Memantine.   Patient was seen and evaluated today she is alert and oriented to self only.  She remains focused on receiving her mail while she is at the hospital.  Per CSW patient has been accepted to [facility] unit.  Patient to be discharged from psychiatric services.  Continue medication management at discharge.  Case staffed with attending psychiatrist MD Lucianne Muss.  Support encouragement reassurance was provided.  HPI: " per amssion assessment note"Deborah [name] is a 76 yo female with PMH anxiety, asthma, cocaine use, history of CVA, HTN, tobacco use, alcohol use who was brought to the hospital with altered mentation and found being down at a bus stop. Urine drug screen was recently positive for cocaine and benzos.  Initial CT head was concerning for PRES syndrome; she underwent MRI brain which showed acute infarct involving left thalamus and ruled out  PRES. Due to ongoing altered mentation, she also underwent EEG which ruled out underlying seizure activity"   Past Psychiatric History:   Risk to Self:   Risk to Others:   Prior Inpatient Therapy:   Prior Outpatient Therapy:    Past Medical History:  Past Medical History:  Diagnosis Date   Alcohol abuse, in remission    Anxiety    Asthma    Cocaine abuse (HCC)    CVA (cerebral vascular accident) (HCC)    Degenerative joint disease    back   Fracture of hand    Headache, chronic daily    Hematuria    Hypertension    Inadequate material resources    Mental/behavioral problem    S/P TAH-BSO    Tobacco abuse    Trichomonal vaginitis     Past Surgical History:  Procedure Laterality Date   ABDOMINAL HYSTERECTOMY  04/13/1989   Per Dr. Lilian Kapur. Abdominal hysterectomy and bilateral salpingo-oophorectomy   FOOT SURGERY     Family History:  Family History  Problem Relation Age of Onset   Cancer Father        colon cancer   Cancer Paternal Aunt        colon cancer   Breast cancer Neg Hx    Family Psychiatric  History:  Social History:  Social History   Substance and Sexual Activity  Alcohol Use No   Alcohol/week: 0.0 standard drinks of alcohol     Social History   Substance and Sexual Activity  Drug Use No    Social History   Socioeconomic History   Marital status: Divorced    Spouse name: Not on file   Number of children: Not on file  Years of education: Not on file   Highest education level: Not on file  Occupational History   Not on file  Tobacco Use   Smoking status: Former    Packs/day: 0.10    Types: Cigarettes    Quit date: 02/25/2011    Years since quitting: 10.8   Smokeless tobacco: Former   Tobacco comments:    quit x 2 months.  Substance and Sexual Activity   Alcohol use: No    Alcohol/week: 0.0 standard drinks of alcohol   Drug use: No   Sexual activity: Not on file  Other Topics Concern   Not on file  Social History Narrative   Not  on file   Social Determinants of Health   Financial Resource Strain: Not on file  Food Insecurity: Not on file  Transportation Needs: Not on file  Physical Activity: Not on file  Stress: Not on file  Social Connections: Not on file   Additional Social History:    Allergies:  No Known Allergies  Labs:  Results for orders placed or performed during the hospital encounter of 01/03/22 (from the past 48 hour(s))  Glucose, capillary     Status: None   Collection Time: 01/09/22  8:03 AM  Result Value Ref Range   Glucose-Capillary 96 70 - 99 mg/dL    Comment: Glucose reference range applies only to samples taken after fasting for at least 8 hours.  Glucose, capillary     Status: None   Collection Time: 01/10/22  7:53 AM  Result Value Ref Range   Glucose-Capillary 87 70 - 99 mg/dL    Comment: Glucose reference range applies only to samples taken after fasting for at least 8 hours.    Current Facility-Administered Medications  Medication Dose Route Frequency Provider Last Rate Last Admin   acetaminophen (TYLENOL) tablet 650 mg  650 mg Oral Q6H PRN Lurline Del, MD       Or   acetaminophen (TYLENOL) suppository 650 mg  650 mg Rectal Q6H PRN Skip Mayer A, MD       albuterol (PROVENTIL) (2.5 MG/3ML) 0.083% nebulizer solution 2.5 mg  2.5 mg Nebulization Q2H PRN Skip Mayer A, MD       amLODipine (NORVASC) tablet 5 mg  5 mg Oral Daily Skip Mayer A, MD   5 mg at 01/10/22 1026   aspirin tablet 325 mg  325 mg Oral Daily Skip Mayer A, MD   325 mg at 01/10/22 1025   cholecalciferol (VITAMIN D3) tablet 1,000 Units  1,000 Units Oral Daily Skip Mayer A, MD   1,000 Units at 01/10/22 1026   enoxaparin (LOVENOX) injection 40 mg  40 mg Subcutaneous Q24H Pamella Pert M, MD   40 mg at 01/09/22 1655   feeding supplement (ENSURE ENLIVE / ENSURE PLUS) liquid 237 mL  237 mL Oral TID BM Skip Mayer A, MD   237 mL at 01/10/22 1027   FLUoxetine (PROZAC) capsule  20 mg  20 mg Oral Daily Skip Mayer A, MD   20 mg at 01/10/22 1026   haloperidol lactate (HALDOL) injection 5 mg  5 mg Intravenous Q6H PRN Marlin Canary U, DO   5 mg at 01/08/22 0003   hydrALAZINE (APRESOLINE) injection 5 mg  5 mg Intravenous Q4H PRN Eduard Clos, MD   5 mg at 01/05/22 2208   lisinopril (ZESTRIL) tablet 20 mg  20 mg Oral Daily Skip Mayer A, MD   20 mg at 01/10/22 1026   LORazepam (ATIVAN)  tablet 1 mg  1 mg Oral Q4H PRN Marlin Canary U, DO   1 mg at 01/09/22 2121   memantine (NAMENDA) tablet 5 mg  5 mg Oral BID Skip Mayer A, MD   5 mg at 01/10/22 1026   metoprolol tartrate (LOPRESSOR) injection 5 mg  5 mg Intravenous Q6H PRN Marlin Canary U, DO       nicotine (NICODERM CQ - dosed in mg/24 hours) patch 21 mg  21 mg Transdermal Daily Skip Mayer A, MD   21 mg at 01/10/22 1027   ondansetron (ZOFRAN) tablet 4 mg  4 mg Oral Q6H PRN Lurline Del, MD       Or   ondansetron [facility]) injection 4 mg  4 mg Intravenous Q6H PRN Lurline Del, MD       Oral care mouth rinse  15 mL Mouth Rinse PRN Lurline Del, MD       risperiDONE (RISPERDAL M-TABS) disintegrating tablet 0.5 mg  0.5 mg Oral BID PRN Massengill, Harrold Donath, MD       risperiDONE (RISPERDAL M-TABS) disintegrating tablet 1 mg  1 mg Oral Q12H Maryagnes Amos, FNP   1 mg at 01/10/22 1025   rosuvastatin (CRESTOR) tablet 10 mg  10 mg Oral Daily Skip Mayer A, MD   10 mg at 01/10/22 1026   senna-docusate (Senokot-S) tablet 1 tablet  1 tablet Oral QHS PRN Lurline Del, MD   1 tablet at 01/07/22 2133    Musculoskeletal:    Psychiatric Specialty Exam:  Presentation  General Appearance: Disheveled  Eye Contact:Fleeting  Speech:Normal Rate  Speech Volume:Normal  Handedness:No data recorded  Mood and Affect  Mood:Depressed; Anxious  Affect:Full Range   Thought Process  Thought Processes:Goal Directed  Descriptions of  Associations:Intact  Orientation:Partial (A&O to self only)  Thought Content:Paranoid Ideation  History of Schizophrenia/Schizoaffective disorder:No data recorded Duration of Psychotic Symptoms:No data recorded Hallucinations:No data recorded Ideas of Reference:Paranoia  Suicidal Thoughts:No data recorded Homicidal Thoughts:No data recorded  Sensorium  Memory:Immediate Poor; Recent Poor; Remote Poor  Judgment:Impaired  Insight:Lacking   Executive Functions  Concentration:Poor  Attention Span:Poor  Recall:No data recorded Fund of Knowledge:No data recorded Language:No data recorded  Psychomotor Activity  Psychomotor Activity:No data recorded  Assets  Assets:No data recorded  Sleep  Sleep:No data recorded  Physical Exam: Physical Exam Vitals and nursing note reviewed.  Musculoskeletal:     Cervical back: Normal range of motion.  Skin:    General: Skin is warm and dry.  Psychiatric:        Mood and Affect: Mood normal.    Review of Systems  Psychiatric/Behavioral:  Negative for depression, hallucinations, substance abuse and suicidal ideas. The patient is nervous/anxious.   All other systems reviewed and are negative.  Blood pressure 121/67, pulse 82, temperature (!) 97.5 F (36.4 C), temperature source Axillary, resp. rate 16, height 5\' 6"  (1.676 m), weight 54.3 kg, SpO2 100 %. Body mass index is 19.34 kg/m.  Treatment Plan Summary: Daily contact with patient to assess and evaluate symptoms and progress in treatment and Medication management  Delirium: Depression: Mood disorder:  Continue Prozac 20 mg daily Continue Resporal 1 mg twice daily Continue Namenda 5 mg twice daily  Hypertension:  Continue lisinopril 20 mg daily Continue Norvasc 5 mg daily  Disposition: Was reported patient is scheduled to transfer to memory care unit today  Psychiatry signing off   , NP 01/10/2022 11:22 AM

## 2022-01-10 NOTE — TOC Transition Note (Signed)
Transition of Care [facility]) - CM/SW Discharge Note   Patient Details  Name: Deborah [name] MRN: [id number] Date of Birth: [birth year]  Transition of Care [facility]) CM/SW Contact:  Otelia Santee, LCSW Phone Number: 01/10/2022, 11:19 AM   Clinical Narrative:    Pt is to transfer to [facility] unit. Pt will be going to room 525B. Nurses to call report to [phone]. No DME needs identified. Pt to be transport by [facility].   Pt currently has community Medicaid and facility is to work on having her Medicaid changed to LTC. Facility has requested LOG for 5 days while this process is taking place. LOG has been requested.    Final next level of care: Memory Care Barriers to Discharge: No Barriers Identified   Patient Goals and CMS Choice Patient states their goals for this hospitalization and ongoing recovery are:: To return to SNF   Choice offered to / list presented to : Patient, [facility] POA / Guardian  Discharge Placement   Existing PASRR number confirmed : 01/09/22          Patient chooses bed at: Other - please specify in the comment section below: [facility]) Patient to be transferred to facility by: [facility] Name of family member notified: Erline Levine Patient and family notified of of transfer: 01/10/22  Discharge Plan and Services In-house Referral: Clinical Social Work Discharge Planning Services: CM Consult Post Acute Care Choice: Skilled Nursing Facility          DME Arranged: N/A DME Agency: NA                  Social Determinants of Health (SDOH) Interventions     Readmission Risk Interventions    01/10/2022   11:18 AM 01/04/2022   10:37 AM  Readmission Risk Prevention Plan  Transportation Screening Complete Complete  PCP or Specialist Appt within 3-5 Days  Complete  HRI or Home Care Consult  Complete  Social Work Consult for Recovery Care Planning/Counseling  Complete  Palliative Care Screening  Not Applicable  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
